# Patient Record
Sex: Female | Born: 1980 | Race: White | Hispanic: No | Marital: Married | Smoking: Former smoker
Health system: Southern US, Community
[De-identification: ages and names within clinical notes are randomized; demographics above are authoritative.]

## PROBLEM LIST (undated history)

## (undated) DIAGNOSIS — F419 Anxiety disorder, unspecified: Secondary | ICD-10-CM

## (undated) DIAGNOSIS — F1220 Cannabis dependence, uncomplicated: Secondary | ICD-10-CM

## (undated) DIAGNOSIS — M1990 Unspecified osteoarthritis, unspecified site: Secondary | ICD-10-CM

## (undated) DIAGNOSIS — K5900 Constipation, unspecified: Secondary | ICD-10-CM

## (undated) DIAGNOSIS — F449 Dissociative and conversion disorder, unspecified: Secondary | ICD-10-CM

## (undated) DIAGNOSIS — Z973 Presence of spectacles and contact lenses: Secondary | ICD-10-CM

## (undated) DIAGNOSIS — F32A Depression, unspecified: Secondary | ICD-10-CM

## (undated) DIAGNOSIS — E119 Type 2 diabetes mellitus without complications: Secondary | ICD-10-CM

## (undated) DIAGNOSIS — Z5189 Encounter for other specified aftercare: Secondary | ICD-10-CM

## (undated) DIAGNOSIS — G8929 Other chronic pain: Secondary | ICD-10-CM

## (undated) DIAGNOSIS — R519 Headache, unspecified: Secondary | ICD-10-CM

## (undated) DIAGNOSIS — R0602 Shortness of breath: Secondary | ICD-10-CM

## (undated) HISTORY — DX: Depression, unspecified: F32.A

## (undated) HISTORY — PX: TUBAL LIGATION: SHX77

## (undated) HISTORY — DX: Encounter for other specified aftercare: Z51.89

## (undated) HISTORY — DX: Anxiety disorder, unspecified: F41.9

## (undated) HISTORY — DX: Cannabis dependence, uncomplicated: F12.20

## (undated) HISTORY — PX: ABDOMINAL HYSTERECTOMY: SHX81

## (undated) HISTORY — DX: Constipation, unspecified: K59.00

## (undated) HISTORY — PX: ARTHROSCOPIC REPAIR ACL: SUR80

## (undated) HISTORY — DX: Dissociative and conversion disorder, unspecified: F44.9

## (undated) HISTORY — PX: KNEE SURGERY: SHX244

---

## 2004-11-29 HISTORY — PX: TUBAL LIGATION: SHX77

## 2016-06-21 ENCOUNTER — Emergency Department (HOSPITAL_COMMUNITY)
Admission: EM | Admit: 2016-06-21 | Discharge: 2016-06-22 | Disposition: A | Discharge status: Home / Self Care | Payer: Self-pay | Attending: Emergency Medicine | Admitting: Emergency Medicine

## 2016-06-21 DIAGNOSIS — F17210 Nicotine dependence, cigarettes, uncomplicated: Secondary | ICD-10-CM | POA: Insufficient documentation

## 2016-06-21 DIAGNOSIS — K047 Periapical abscess without sinus: Secondary | ICD-10-CM | POA: Insufficient documentation

## 2016-06-21 DIAGNOSIS — K029 Dental caries, unspecified: Secondary | ICD-10-CM | POA: Insufficient documentation

## 2016-06-21 NOTE — ED Triage Notes (Signed)
Pt reports she has a broken tooth on upper left jaw, swelling started today after eating at approx 1100. Denies trouble breathing.

## 2016-06-22 MED ORDER — PENICILLIN V POTASSIUM 500 MG PO TABS: 500.0000 mg | ORAL_TABLET | Freq: Four times a day (QID) | ORAL | 0 refills | Status: DC

## 2016-06-22 MED ORDER — KETOROLAC TROMETHAMINE 30 MG/ML IJ SOLN
30.0000 mg | Freq: Once | INTRAMUSCULAR | Status: AC
  Administered 2016-06-22: 30 mg via INTRAVENOUS
  Filled 2016-06-22: qty 1

## 2016-06-22 MED ORDER — NAPROXEN 500 MG PO TABS: ORAL_TABLET | ORAL | 0 refills | Status: DC

## 2016-06-22 MED ORDER — CLINDAMYCIN PHOSPHATE 900 MG/50ML IV SOLN
900.0000 mg | Freq: Once | INTRAVENOUS | Status: AC
  Administered 2016-06-22: 900 mg via INTRAVENOUS
  Filled 2016-06-22: qty 50

## 2016-06-22 MED ORDER — FENTANYL CITRATE (PF) 100 MCG/2ML IJ SOLN
25.0000 ug | Freq: Once | INTRAMUSCULAR | Status: AC
  Administered 2016-06-22: 25 ug via INTRAVENOUS
  Filled 2016-06-22: qty 2

## 2016-06-22 MED ORDER — SODIUM CHLORIDE 0.9 % IV BOLUS (SEPSIS)
1000.0000 mL | Freq: Once | INTRAVENOUS | Status: AC
  Administered 2016-06-22: 1000 mL via INTRAVENOUS

## 2016-06-22 NOTE — ED Provider Notes (Signed)
[facility] DEPT Provider Note   CSN: [id number] Arrival date & time: 06/21/16  2304  First Provider Contact:  12:05 AM  By signing my name below, I, Dyke Brackett, attest that this documentation has been prepared under the direction and in the presence of Rolland Porter, MD . Electronically Signed: Dyke Brackett, Scribe. 06/22/2016. 12:59 AM.  History   Chief Complaint Chief Complaint  Patient presents with  . Dental Pain    HPI Allison Galloway is a 35 y.o. female who presents to the Emergency Department complaining of moderate upper left dental pain onset 13 hours ago. Per pt, she has had intermittent dental pain for The past four months, but states it is much worse today. She states the tooth has been broken for at least a year. She states pain is worsened by eating. Pt also notes associated swelling that started this evening. Pt states she had eaten a chicken sandwich from [facility] at 11 this AM and has experienced symptoms after eating lunch. Pt was seen by a dentist who drilled another tooth and has not returned because she was "terrified". Pt denies any fever, trouble breathing, swallowing or drooling. Pt is a smoker and smokes .5 pack a day. She works as a Building control surveyor.   PCP none   The history is provided by the patient.  No language interpreter was used.   Past Medical History:  Diagnosis Date  . Cholestasis     Patient Active Problem List   Diagnosis Date Noted  . Cesarean delivery delivered 03/20/2016    Past Surgical History:  Procedure Laterality Date  . BREAST SURGERY     augmentation   . CESAREAN SECTION N/A 03/20/2016   Procedure: CESAREAN SECTION/ Tubal Ligation;  Surgeon: Louretta Shorten, MD;  Location: [facility];  Service: Obstetrics;  Laterality: N/A;  . GASTRIC BYPASS      OB History    Gravida Para Term Preterm AB Living   5 4 3 1 1 1    SAB TAB Ectopic Multiple Live Births   1     0         Home Medications    Prior to Admission medications   Medication Sig  Start Date End Date Taking? Authorizing Provider  acetaminophen (TYLENOL) 325 MG tablet Take 650 mg by mouth every 6 (six) hours as needed for mild pain.    Historical Provider, MD  Cyanocobalamin (VITAMIN B 12 PO) Take 1 tablet by mouth 2 (two) times daily.     Historical Provider, MD  folic acid (FOLVITE) A999333 MCG tablet Take 400 mcg by mouth daily.    Historical Provider, MD  ibuprofen (ADVIL,MOTRIN) 600 MG tablet Take 1 tablet (600 mg total) by mouth every 6 (six) hours. 03/24/16   Juanda Chance, NP  IRON PO Take 1 tablet by mouth daily.    Historical Provider, MD  naproxen (NAPROSYN) 500 MG tablet Take 1 po BID with food prn pain 06/22/16   Rolland Porter, MD  oxyCODONE-acetaminophen (PERCOCET/ROXICET) 5-325 MG tablet Take 1-2 tablets by mouth every 4 (four) hours as needed for moderate pain. 03/24/16   Juanda Chance, NP  penicillin v potassium (VEETID) 500 MG tablet Take 1 tablet (500 mg total) by mouth 4 (four) times daily. 06/22/16   Rolland Porter, MD  Prenatal Vit-Fe Fumarate-FA (PRENATAL MULTIVITAMIN) TABS tablet Take 1 tablet by mouth daily at 12 noon.    Historical Provider, MD  vitamin C (ASCORBIC ACID) 500 MG tablet Take 1,000 mg by mouth daily.  Historical Provider, MD    Family History Family History  Problem Relation Age of Onset  . Hypertension Other     Social History Social History  Substance Use Topics  . Smoking status: Current Every Day Smoker    Packs/day: 0.50    Types: Cigarettes  . Smokeless tobacco: Current User  . Alcohol use No   Takes care of her aunt  Allergies   Review of patient's allergies indicates no active allergies.   Review of Systems Review of Systems  HENT: Positive for dental problem and facial swelling. Negative for drooling and trouble swallowing.   All other systems reviewed and are negative.   Physical Exam Updated Vital Signs BP 139/90 (BP Location: Left Arm)   Pulse 93   Temp 98.3 F (36.8 C) (Oral)   Resp 16   Ht 5\' 4"  (1.626 m)   Wt  160 lb (72.6 kg)   LMP 05/31/2016   SpO2 98%   BMI 27.46 kg/m    Vital signs normal    Physical Exam  Constitutional: She is oriented to person, place, and time. She appears well-developed and well-nourished.  Non-toxic appearance. She does not appear ill. No distress.  HENT:  Head: Normocephalic.  Right Ear: External ear normal.  Left Ear: External ear normal.  Nose: Nose normal. No mucosal edema or rhinorrhea.  Mouth/Throat: Oropharynx is clear and moist and mucous membranes are normal. No dental abscesses or uvula swelling.    She Has swelling of the left side of her face. See photo.  Speech is normal. No drooling. No SOB.   Eyes: Conjunctivae and EOM are normal. Pupils are equal, round, and reactive to light.  Neck: Normal range of motion and full passive range of motion without pain. Neck supple.  Cardiovascular: Normal rate.   Pulmonary/Chest: Effort normal. No respiratory distress. She has no rhonchi. She exhibits no crepitus.  Abdominal: Normal appearance. She exhibits no distension.  Musculoskeletal: Normal range of motion.  Moves all extremities well.   Neurological: She is alert and oriented to person, place, and time. She has normal strength. No cranial nerve deficit.  Skin: Skin is warm, dry and intact. No rash noted. No erythema. No pallor.  Psychiatric: She has a normal mood and affect. Her speech is normal and behavior is normal. Her mood appears not anxious.  Nursing note and vitals reviewed.       ED Treatments / Results   Medications  sodium chloride 0.9 % bolus 1,000 mL (0 mLs Intravenous Stopped 06/22/16 0220)  clindamycin (CLEOCIN) IVPB 900 mg (0 mg Intravenous Stopped 06/22/16 0140)  ketorolac (TORADOL) 30 MG/ML injection 30 mg (30 mg Intravenous Given 06/22/16 0104)  fentaNYL (SUBLIMAZE) injection 25 mcg (25 mcg Intravenous Given 06/22/16 0105)      DIAGNOSTIC STUDIES:  Oxygen Saturation is 98% on RA, normal by my interpretation.     COORDINATION OF CARE:  12:12 AM Discussed treatment plan which includes Toradol and fentanyl for pain and she was given IV clindamycin for probable dental abscess with pt at bedside and pt agreed to plan.   Recheck at time of discharge patient states she's feeling better. She still has the swelling.      Procedures     Initial Impression / Assessment and Plan / ED Course  I have reviewed the triage vital signs and the nursing notes.  Pertinent labs & imaging results that were available during my care of the patient were reviewed by me and considered in my  medical decision making (see chart for details).    Final Clinical Impressions(s) / ED Diagnoses   Final diagnoses:  Abscessed tooth  Dental caries    New Prescriptions New Prescriptions   NAPROXEN (NAPROSYN) 500 MG TABLET    Take 1 po BID with food prn pain   PENICILLIN V POTASSIUM (VEETID) 500 MG TABLET    Take 1 tablet (500 mg total) by mouth 4 (four) times daily.    Plan discharge  Rolland Porter, MD, FACEP   I personally performed the services described in this documentation, which was scribed in my presence. The recorded information has been reviewed and considered.  Rolland Porter, MD, Barbette Or, MD 06/22/16 [phone]

## 2016-06-22 NOTE — Discharge Instructions (Signed)
Use ice packs on your face to reduce the swelling. Take the antibiotics until gone. You need to follow-up with a dentist for definitive care of your tooth. Return to the emergency department if you get high fevers, you're unable to swallow or have difficulty breathing, or if the swelling gets progressively worse instead of better.

## 2016-06-22 NOTE — ED Notes (Signed)
Pt alert & oriented x4, stable gait. Patient given discharge instructions, paperwork & prescription(s). Patient  instructed to stop at the registration desk to finish any additional paperwork. Patient verbalized understanding. Pt left department w/ no further questions. 

## 2017-04-06 ENCOUNTER — Emergency Department (HOSPITAL_COMMUNITY)
Admission: EM | Admit: 2017-04-06 | Discharge: 2017-04-06 | Discharge status: Absent without leave | Payer: Self-pay | Attending: Emergency Medicine | Admitting: Emergency Medicine

## 2017-04-06 ENCOUNTER — Emergency Department (HOSPITAL_COMMUNITY)
Admission: EM | Admit: 2017-04-06 | Discharge: 2017-04-06 | Disposition: A | Discharge status: Home / Self Care | Payer: Self-pay | Attending: Emergency Medicine | Admitting: Emergency Medicine

## 2017-04-06 ENCOUNTER — Encounter (HOSPITAL_COMMUNITY): Payer: Self-pay | Admitting: Emergency Medicine

## 2017-04-06 DIAGNOSIS — H65191 Other acute nonsuppurative otitis media, right ear: Secondary | ICD-10-CM | POA: Insufficient documentation

## 2017-04-06 DIAGNOSIS — H65199 Other acute nonsuppurative otitis media, unspecified ear: Secondary | ICD-10-CM

## 2017-04-06 DIAGNOSIS — F17210 Nicotine dependence, cigarettes, uncomplicated: Secondary | ICD-10-CM | POA: Insufficient documentation

## 2017-04-06 MED ORDER — HYDROCODONE-ACETAMINOPHEN 5-325 MG PO TABS: 2.0000 | ORAL_TABLET | ORAL | 0 refills | Status: DC | PRN

## 2017-04-06 MED ORDER — AMOXICILLIN 500 MG PO CAPS: 500.0000 mg | ORAL_CAPSULE | Freq: Three times a day (TID) | ORAL | 0 refills | Status: DC

## 2017-04-06 NOTE — ED Provider Notes (Signed)
[facility] DEPT Provider Note   CSN: [id number] Arrival date & time: 04/06/17  0818     History   Chief Complaint Chief Complaint  Patient presents with  . Otalgia    HPI Allison Galloway is a 36 y.o. female.  The history is provided by the patient. No language interpreter was used.  Otalgia  This is a new problem. The current episode started more than 2 days ago. The problem occurs constantly. The problem has been gradually worsening. There has been no fever. The pain is moderate. Associated symptoms include ear discharge.    History reviewed. No pertinent past medical history.  There are no active problems to display for this patient.   Past Surgical History:  Procedure Laterality Date  . ARTHROSCOPIC REPAIR ACL    . CESAREAN SECTION    . TUBAL LIGATION      OB History    No data available       Home Medications    Prior to Admission medications   Not on File    Family History History reviewed. No pertinent family history.  Social History Social History  Substance Use Topics  . Smoking status: Current Every Day Smoker    Packs/day: 0.50    Types: Cigarettes  . Smokeless tobacco: Never Used  . Alcohol use No     Allergies   Patient has no allergy information on record.   Review of Systems Review of Systems  HENT: Positive for ear discharge and ear pain.   All other systems reviewed and are negative.    Physical Exam Updated Vital Signs BP (!) 138/99 (BP Location: Right Arm)   Pulse 70   Temp 97.8 F (36.6 C) (Oral)   Resp 18   Ht 5\' 4"  (1.626 m)   Wt 72.6 kg   LMP 03/16/2017   SpO2 100%   BMI 27.46 kg/m   Physical Exam  Constitutional: She appears well-developed and well-nourished. No distress.  HENT:  Head: Normocephalic and atraumatic.  Erythema right tm. bulging  Eyes: Conjunctivae are normal.  Neck: Neck supple.  Cardiovascular: Normal rate and regular rhythm.   No murmur heard. Pulmonary/Chest: Effort normal and  breath sounds normal. No respiratory distress.  Abdominal: Soft. There is no tenderness.  Musculoskeletal: She exhibits no edema.  Neurological: She is alert.  Skin: Skin is warm and dry.  Psychiatric: She has a normal mood and affect.  Nursing note and vitals reviewed.    ED Treatments / Results  Labs (all labs ordered are listed, but only abnormal results are displayed) Labs Reviewed - No data to display  EKG  EKG Interpretation None       Radiology No results found.  Procedures Procedures (including critical care time)  Medications Ordered in ED Medications - No data to display   Initial Impression / Assessment and Plan / ED Course  I have reviewed the triage vital signs and the nursing notes.  Pertinent labs & imaging results that were available during my care of the patient were reviewed by me and considered in my medical decision making (see chart for details).      Final Clinical Impressions(s) / ED Diagnoses   Final diagnoses:  Other acute nonsuppurative otitis media, recurrence not specified, unspecified laterality    New Prescriptions New Prescriptions   AMOXICILLIN (AMOXIL) 500 MG CAPSULE    Take 1 capsule (500 mg total) by mouth 3 (three) times daily.   HYDROCODONE-ACETAMINOPHEN (NORCO/VICODIN) 5-325 MG TABLET    Take  2 tablets by mouth every 4 (four) hours as needed.     Fransico Meadow, PA-C 04/06/17 0835    Fransico Meadow, PA-C 04/06/17 0836    Fransico Meadow, PA-C 04/06/17 1518    Julianne Rice, MD 04/11/17 [phone]

## 2017-04-06 NOTE — ED Triage Notes (Signed)
Pt reports right ear pain for last several days. Pt denies any new or recent trauma. Pt reports intermittent cough with green sputum. nad noted.

## 2018-12-09 ENCOUNTER — Emergency Department (HOSPITAL_COMMUNITY): Payer: Self-pay

## 2018-12-09 ENCOUNTER — Encounter (HOSPITAL_COMMUNITY): Payer: Self-pay | Admitting: *Deleted

## 2018-12-09 ENCOUNTER — Emergency Department (HOSPITAL_COMMUNITY)
Admission: EM | Admit: 2018-12-09 | Discharge: 2018-12-09 | Disposition: A | Discharge status: Home / Self Care | Payer: Self-pay | Attending: Emergency Medicine | Admitting: Emergency Medicine

## 2018-12-09 DIAGNOSIS — Z87891 Personal history of nicotine dependence: Secondary | ICD-10-CM | POA: Insufficient documentation

## 2018-12-09 DIAGNOSIS — J9801 Acute bronchospasm: Secondary | ICD-10-CM | POA: Insufficient documentation

## 2018-12-09 DIAGNOSIS — J069 Acute upper respiratory infection, unspecified: Secondary | ICD-10-CM | POA: Insufficient documentation

## 2018-12-09 LAB — CBC WITH DIFFERENTIAL/PLATELET
ABS IMMATURE GRANULOCYTES: 0.18 10*3/uL — AB (ref 0.00–0.07)
Basophils Absolute: 0.1 10*3/uL (ref 0.0–0.1)
Basophils Relative: 0 %
Eosinophils Absolute: 0.1 10*3/uL (ref 0.0–0.5)
Eosinophils Relative: 0 %
HCT: 39.8 % (ref 36.0–46.0)
HEMOGLOBIN: 13.1 g/dL (ref 12.0–15.0)
IMMATURE GRANULOCYTES: 1 %
LYMPHS PCT: 18 %
Lymphs Abs: 3.1 10*3/uL (ref 0.7–4.0)
MCH: 27.5 pg (ref 26.0–34.0)
MCHC: 32.9 g/dL (ref 30.0–36.0)
MCV: 83.6 fL (ref 80.0–100.0)
Monocytes Absolute: 1 10*3/uL (ref 0.1–1.0)
Monocytes Relative: 6 %
NEUTROS PCT: 75 %
Neutro Abs: 12.4 10*3/uL — ABNORMAL HIGH (ref 1.7–7.7)
Platelets: 327 10*3/uL (ref 150–400)
RBC: 4.76 MIL/uL (ref 3.87–5.11)
RDW: 14 % (ref 11.5–15.5)
WBC: 16.8 10*3/uL — AB (ref 4.0–10.5)
nRBC: 0 % (ref 0.0–0.2)

## 2018-12-09 LAB — BASIC METABOLIC PANEL
Anion gap: 10 (ref 5–15)
BUN: 15 mg/dL (ref 6–20)
CO2: 21 mmol/L — ABNORMAL LOW (ref 22–32)
Calcium: 9.1 mg/dL (ref 8.9–10.3)
Chloride: 106 mmol/L (ref 98–111)
Creatinine, Ser: 0.79 mg/dL (ref 0.44–1.00)
GFR calc Af Amer: >60 mL/min (ref >60)
GFR calc non Af Amer: >60 mL/min (ref >60)
Glucose, Bld: 116 mg/dL — ABNORMAL HIGH (ref 70–99)
POTASSIUM: 3.7 mmol/L (ref 3.5–5.1)
SODIUM: 137 mmol/L (ref 135–145)

## 2018-12-09 LAB — I-STAT BETA HCG BLOOD, ED (MC, WL, AP ONLY): I-stat hCG, quantitative: <5 m[IU]/mL (ref <5)

## 2018-12-09 LAB — D-DIMER, QUANTITATIVE: D-Dimer, Quant: 0.47 ug/mL-FEU (ref 0.00–0.50)

## 2018-12-09 LAB — INFLUENZA PANEL BY PCR (TYPE A & B)
Influenza A By PCR: NEGATIVE
Influenza B By PCR: NEGATIVE

## 2018-12-09 LAB — TROPONIN I: Troponin I: <0.03 ng/mL (ref <0.03)

## 2018-12-09 MED ORDER — IPRATROPIUM BROMIDE 0.02 % IN SOLN
1.0000 mg | Freq: Once | RESPIRATORY_TRACT | Status: AC
  Administered 2018-12-09: 1 mg via RESPIRATORY_TRACT
  Filled 2018-12-09: qty 5

## 2018-12-09 MED ORDER — IPRATROPIUM-ALBUTEROL 0.5-2.5 (3) MG/3ML IN SOLN
RESPIRATORY_TRACT | Status: AC
  Filled 2018-12-09: qty 3

## 2018-12-09 MED ORDER — METHYLPREDNISOLONE SODIUM SUCC 125 MG IJ SOLR
125.0000 mg | Freq: Once | INTRAMUSCULAR | Status: AC
  Administered 2018-12-09: 125 mg via INTRAVENOUS
  Filled 2018-12-09: qty 2

## 2018-12-09 MED ORDER — ALBUTEROL SULFATE (2.5 MG/3ML) 0.083% IN NEBU: 5.0000 mg | INHALATION_SOLUTION | Freq: Once | RESPIRATORY_TRACT | Status: DC

## 2018-12-09 MED ORDER — ALBUTEROL (5 MG/ML) CONTINUOUS INHALATION SOLN
10.0000 mg/h | INHALATION_SOLUTION | Freq: Once | RESPIRATORY_TRACT | Status: AC
  Administered 2018-12-09: 10 mg/h via RESPIRATORY_TRACT
  Filled 2018-12-09: qty 20

## 2018-12-09 MED ORDER — ALBUTEROL SULFATE (2.5 MG/3ML) 0.083% IN NEBU: 2.5000 mg | INHALATION_SOLUTION | RESPIRATORY_TRACT | 0 refills | Status: DC | PRN

## 2018-12-09 MED ORDER — ALBUTEROL SULFATE HFA 108 (90 BASE) MCG/ACT IN AERS
4.0000 | INHALATION_SPRAY | RESPIRATORY_TRACT | Status: AC
  Administered 2018-12-09: 4 via RESPIRATORY_TRACT
  Filled 2018-12-09: qty 6.7

## 2018-12-09 MED ORDER — PREDNISONE 20 MG PO TABS: 40.0000 mg | ORAL_TABLET | Freq: Every day | ORAL | 0 refills | Status: DC

## 2018-12-09 NOTE — ED Notes (Signed)
Pt ambulated around nurse's station. Pt pulse ox 96-97% while ambulating.

## 2018-12-09 NOTE — ED Triage Notes (Signed)
Pt with sob off and on for last couple of days. Dx with flu and bronchitis at [facility]  On Tuesday. Started on steroids.  Pt sob that talking is difficult and audible wheezing noted.

## 2018-12-09 NOTE — ED Provider Notes (Signed)
[facility] EMERGENCY DEPARTMENT Provider Note   CSN: [id number] Arrival date & time: 12/09/18  1842     History   Chief Complaint Chief Complaint  Patient presents with  . Shortness of Breath    HPI Allison Galloway is a 38 y.o. female.  HPI  Pt was seen at 1905.  Per pt, c/o gradual onset and worsening of persistent cough, wheezing and SOB for the past 4 days.  Has been associated with generalized chest "pain," described as "soreness." States she was evaluated at [facility] ED for her symptoms 4 days ago, dx flu and bronchitis. States she was started on "steroids." Denies palpitations, no back pain, no abd pain, no N/V/D, no fevers, no rash.    History reviewed. No pertinent past medical history.  There are no active problems to display for this patient.   Past Surgical History:  Procedure Laterality Date  . ARTHROSCOPIC REPAIR ACL    . CESAREAN SECTION    . KNEE SURGERY    . TUBAL LIGATION       OB History   No obstetric history on file.      Home Medications    Prior to Admission medications   Medication Sig Start Date End Date Taking? Authorizing Provider  DM-Doxylamine-Acetaminophen (NYQUIL COLD & FLU PO) Take 1 tablet by mouth daily as needed (cold).   Yes [provider]  amoxicillin (AMOXIL) 500 MG capsule Take 1 capsule (500 mg total) by mouth 3 (three) times daily. 04/06/17   Fransico Meadow, PA-C  HYDROcodone-acetaminophen (NORCO/VICODIN) 5-325 MG tablet Take 2 tablets by mouth every 4 (four) hours as needed. 04/06/17   Fransico Meadow, PA-C    Family History History reviewed. No pertinent family history.  Social History Social History   Tobacco Use  . Smoking status: Former Smoker    Packs/day: 0.50    Types: Cigarettes  . Smokeless tobacco: Never Used  Substance Use Topics  . Alcohol use: No  . Drug use: No     Allergies   Patient has no known allergies.   Review of Systems Review of Systems ROS: Statement: All systems negative  except as marked or noted in the HPI; Constitutional: Negative for fever and chills. ; ; Eyes: Negative for eye pain, redness and discharge. ; ; ENMT: Negative for ear pain, hoarseness, nasal congestion, sinus pressure and sore throat. ; ; Cardiovascular: Negative for palpitations, diaphoresis, and peripheral edema. ; ; Respiratory: +cough, wheezing, SOB, CP. Negative for stridor. ; ; Gastrointestinal: Negative for nausea, vomiting, diarrhea, abdominal pain, blood in stool, hematemesis, jaundice and rectal bleeding. . ; ; Genitourinary: Negative for dysuria, flank pain and hematuria. ; ; Musculoskeletal: Negative for back pain and neck pain. Negative for swelling and trauma.; ; Skin: Negative for pruritus, rash, abrasions, blisters, bruising and skin lesion.; ; Neuro: Negative for headache, lightheadedness and neck stiffness. Negative for weakness, altered level of consciousness, altered mental status, extremity weakness, paresthesias, involuntary movement, seizure and syncope.       Physical Exam Updated Vital Signs BP 124/80   Pulse 77   Resp 17   Ht 5\' 3"  (1.6 m)   Wt 77.1 kg   LMP 12/01/2018   SpO2 98%   BMI 30.11 kg/m    BP 118/77   Pulse 84   Resp 15   Ht 5\' 3"  (1.6 m)   Wt 77.1 kg   LMP 12/01/2018   SpO2 96%   BMI 30.11 kg/m    Physical Exam  1910: Physical examination:  Nursing notes reviewed; Vital signs and O2 SAT reviewed;  Constitutional: Well developed, Well nourished, Well hydrated, Uncomfortable appearing.;; Head:  Normocephalic, atraumatic; Eyes: EOMI, PERRL, No scleral icterus; ENMT: Mouth and pharynx normal, Mucous membranes moist; Neck: Supple, Full range of motion, No lymphadenopathy; Cardiovascular: Tachycardic rate and rhythm, No gallop; Respiratory: Breath sounds coarse & equal bilaterally, insp/exp wheezes bilat. Faint audible wheezing.  Speaking short sentences, sitting upright, tachypneic.; Chest: Nontender, Movement normal; Abdomen: Soft, Nontender,  Nondistended, Normal bowel sounds; Genitourinary: No CVA tenderness; Extremities: Pulses normal, No tenderness, No edema, No calf edema or asymmetry.; Neuro: AA&Ox3, Major CN grossly intact.  Speech clear. No gross focal motor or sensory deficits in extremities.; Skin: Color normal, Warm, Dry.    ED Treatments / Results  Labs (all labs ordered are listed, but only abnormal results are displayed)   EKG EKG Interpretation  Date/Time:  Saturday December 09 2018 18:58:10 EST Ventricular Rate:  79 PR Interval:    QRS Duration: 94 QT Interval:  399 QTC Calculation: 458 R Axis:   45 Text Interpretation:  Sinus rhythm Inferior infarct, old Anterolateral Q wave, probably normal for age Baseline wander No old tracing to compare Confirmed by Francine Graven [phone]) on 12/09/2018 7:11:29 PM   Radiology   Procedures Procedures (including critical care time)  Medications Ordered in ED Medications  ipratropium-albuterol (DUONEB) 0.5-2.5 (3) MG/3ML nebulizer solution (  Not Given 12/09/18 1918)  albuterol (PROVENTIL,VENTOLIN) solution continuous neb (10 mg/hr Nebulization Given 12/09/18 1918)  ipratropium (ATROVENT) nebulizer solution 1 mg (1 mg Nebulization Given 12/09/18 1918)  methylPREDNISolone sodium succinate (SOLU-MEDROL) 125 mg/2 mL injection 125 mg (125 mg Intravenous Given 12/09/18 1933)     Initial Impression / Assessment and Plan / ED Course  I have reviewed the triage vital signs and the nursing notes.  Pertinent labs & imaging results that were available during my care of the patient were reviewed by me and considered in my medical decision making (see chart for details).  MDM Reviewed: previous chart, nursing note and vitals Reviewed previous: labs and ECG Interpretation: labs, ECG and x-ray Total time providing critical care: 30-74 minutes. This excludes time spent performing separately reportable procedures and services.  CRITICAL CARE Performed by: Francine Graven Total critical care time: 35 minutes Critical care time was exclusive of separately billable procedures and treating other patients. Critical care was necessary to treat or prevent imminent or life-threatening deterioration. Critical care was time spent personally by me on the following activities: development of treatment plan with patient and/or surrogate as well as nursing, discussions with consultants, evaluation of patient's response to treatment, examination of patient, obtaining history from patient or surrogate, ordering and performing treatments and interventions, ordering and review of laboratory studies, ordering and review of radiographic studies, pulse oximetry and re-evaluation of patient's condition.   Results for orders placed or performed during the hospital encounter of 12/09/18  D-dimer, quantitative  Result Value Ref Range   D-Dimer, Quant 0.47 0.00 - 0.50 ug/mL-FEU  CBC with Differential  Result Value Ref Range   WBC 16.8 (H) 4.0 - 10.5 K/uL   RBC 4.76 3.87 - 5.11 MIL/uL   Hemoglobin 13.1 12.0 - 15.0 g/dL   HCT 39.8 36.0 - 46.0 %   MCV 83.6 80.0 - 100.0 fL   MCH 27.5 26.0 - 34.0 pg   MCHC 32.9 30.0 - 36.0 g/dL   RDW 14.0 11.5 - 15.5 %   Platelets 327 150 - 400 K/uL  nRBC 0.0 0.0 - 0.2 %   Neutrophils Relative % 75 %   Neutro Abs 12.4 (H) 1.7 - 7.7 K/uL   Lymphocytes Relative 18 %   Lymphs Abs 3.1 0.7 - 4.0 K/uL   Monocytes Relative 6 %   Monocytes Absolute 1.0 0.1 - 1.0 K/uL   Eosinophils Relative 0 %   Eosinophils Absolute 0.1 0.0 - 0.5 K/uL   Basophils Relative 0 %   Basophils Absolute 0.1 0.0 - 0.1 K/uL   Immature Granulocytes 1 %   Abs Immature Granulocytes 0.18 (H) 0.00 - 0.07 K/uL  Troponin I - Once  Result Value Ref Range   Troponin I <0.03 <0.03 ng/mL  Basic metabolic panel  Result Value Ref Range   Sodium 137 135 - 145 mmol/L   Potassium 3.7 3.5 - 5.1 mmol/L   Chloride 106 98 - 111 mmol/L   CO2 21 (L) 22 - 32 mmol/L   Glucose, Bld 116  (H) 70 - 99 mg/dL   BUN 15 6 - 20 mg/dL   Creatinine, Ser 0.79 0.44 - 1.00 mg/dL   Calcium 9.1 8.9 - 10.3 mg/dL   GFR calc non Af Amer >60 >60 mL/min   GFR calc Af Amer >60 >60 mL/min   Anion gap 10 5 - 15  I-Stat beta hCG blood, ED  Result Value Ref Range   I-stat hCG, quantitative <5.0 <5 mIU/mL   Comment 3           Dg Chest Port 1 View Result Date: 12/09/2018 CLINICAL DATA:  Intermittent shortness of breath over the past 2 days. Patient diagnosed with influenza and bronchitis at [facility] 4 days ago at which time she was started on corticosteroid therapy. Audible wheezing currently. EXAM: PORTABLE CHEST 1 VIEW COMPARISON:  None. FINDINGS: Markedly suboptimal inspiration accounts for atelectasis at the lung bases and accentuates the cardiac silhouette. Taking this into account, cardiac silhouette likely upper normal to mildly enlarged. Lungs otherwise clear without evidence of confluent airspace consolidation. IMPRESSION: Markedly suboptimal inspiration accounts for bibasilar atelectasis. No acute cardiopulmonary disease otherwise. Electronically Signed   By: Evangeline Dakin M.D.   On: 12/09/2018 19:33    2220:  Pt states she "feels better" after neb and steroid.  NAD, lungs CTA bilat, no wheezing, resps easy, speaking full sentences, Sats 96-97% R/A.  Pt ambulated around the ED with Sats remaining 96-97 % R/A, resps easy, NAD.  Pt states she feels better and wants to go home now. Pt states she has a neb machine at home; will rx solution. States she only has one or 2 days left of rx medrol dose pack; will rx prednisone burst. 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 Dx and testing d/w pt and family.  Questions answered.  Verb understanding, agreeable to d/c home with outpt f/u.     Final Clinical Impressions(s) / ED Diagnoses   Final diagnoses:  None    ED Discharge Orders    None       Francine Graven, DO 12/14/18 1109

## 2018-12-09 NOTE — Discharge Instructions (Addendum)
Stop taking the steroid dose pack and take the new prescriptions as directed.  Use your albuterol inhaler (2 to 4 puffs) or your albuterol nebulizer (1 unit dose) every 4 hours for the next 7 days, then as needed for cough, wheezing, or shortness of breath.  Call your regular medical doctor Monday morning to schedule a follow up appointment within the next 3 days.  Return to the Emergency Department immediately sooner if worsening.

## 2019-06-27 ENCOUNTER — Other Ambulatory Visit: Payer: Self-pay

## 2019-06-27 ENCOUNTER — Emergency Department (HOSPITAL_COMMUNITY): Payer: Self-pay

## 2019-06-27 ENCOUNTER — Emergency Department (HOSPITAL_COMMUNITY)
Admission: EM | Admit: 2019-06-27 | Discharge: 2019-06-27 | Disposition: A | Discharge status: Home / Self Care | Payer: Self-pay | Attending: Emergency Medicine | Admitting: Emergency Medicine

## 2019-06-27 ENCOUNTER — Encounter (HOSPITAL_COMMUNITY): Payer: Self-pay | Admitting: *Deleted

## 2019-06-27 DIAGNOSIS — R05 Cough: Secondary | ICD-10-CM | POA: Insufficient documentation

## 2019-06-27 DIAGNOSIS — Z20828 Contact with and (suspected) exposure to other viral communicable diseases: Secondary | ICD-10-CM | POA: Insufficient documentation

## 2019-06-27 DIAGNOSIS — F17210 Nicotine dependence, cigarettes, uncomplicated: Secondary | ICD-10-CM | POA: Insufficient documentation

## 2019-06-27 DIAGNOSIS — R0602 Shortness of breath: Secondary | ICD-10-CM | POA: Insufficient documentation

## 2019-06-27 DIAGNOSIS — R0789 Other chest pain: Secondary | ICD-10-CM | POA: Insufficient documentation

## 2019-06-27 LAB — BASIC METABOLIC PANEL
Anion gap: 6 (ref 5–15)
BUN: 13 mg/dL (ref 6–20)
CO2: 23 mmol/L (ref 22–32)
Calcium: 9.4 mg/dL (ref 8.9–10.3)
Chloride: 110 mmol/L (ref 98–111)
Creatinine, Ser: 0.85 mg/dL (ref 0.44–1.00)
GFR calc Af Amer: >60 mL/min (ref >60)
GFR calc non Af Amer: >60 mL/min (ref >60)
Glucose, Bld: 88 mg/dL (ref 70–99)
Potassium: 3.6 mmol/L (ref 3.5–5.1)
Sodium: 139 mmol/L (ref 135–145)

## 2019-06-27 LAB — CBC WITH DIFFERENTIAL/PLATELET
Abs Immature Granulocytes: 0.01 10*3/uL (ref 0.00–0.07)
Basophils Absolute: 0 10*3/uL (ref 0.0–0.1)
Basophils Relative: 0 %
Eosinophils Absolute: 0.3 10*3/uL (ref 0.0–0.5)
Eosinophils Relative: 3 %
HCT: 40.4 % (ref 36.0–46.0)
Hemoglobin: 13.4 g/dL (ref 12.0–15.0)
Immature Granulocytes: 0 %
Lymphocytes Relative: 39 %
Lymphs Abs: 3.5 10*3/uL (ref 0.7–4.0)
MCH: 27.6 pg (ref 26.0–34.0)
MCHC: 33.2 g/dL (ref 30.0–36.0)
MCV: 83.3 fL (ref 80.0–100.0)
Monocytes Absolute: 0.5 10*3/uL (ref 0.1–1.0)
Monocytes Relative: 6 %
Neutro Abs: 4.7 10*3/uL (ref 1.7–7.7)
Neutrophils Relative %: 52 %
Platelets: 271 10*3/uL (ref 150–400)
RBC: 4.85 MIL/uL (ref 3.87–5.11)
RDW: 13 % (ref 11.5–15.5)
WBC: 9 10*3/uL (ref 4.0–10.5)
nRBC: 0 % (ref 0.0–0.2)

## 2019-06-27 LAB — D-DIMER, QUANTITATIVE: D-Dimer, Quant: 0.34 ug/mL-FEU (ref 0.00–0.50)

## 2019-06-27 LAB — TROPONIN I (HIGH SENSITIVITY): Troponin I (High Sensitivity): <2 ng/L (ref <18)

## 2019-06-27 LAB — SARS CORONAVIRUS 2 BY RT PCR (HOSPITAL ORDER, PERFORMED IN ~~LOC~~ HOSPITAL LAB): SARS Coronavirus 2: NEGATIVE

## 2019-06-27 LAB — POC URINE PREG, ED: Preg Test, Ur: NEGATIVE

## 2019-06-27 MED ORDER — PREDNISONE 20 MG PO TABS: 40.0000 mg | ORAL_TABLET | Freq: Every day | ORAL | 0 refills | Status: DC

## 2019-06-27 MED ORDER — ALBUTEROL SULFATE HFA 108 (90 BASE) MCG/ACT IN AERS
2.0000 | INHALATION_SPRAY | Freq: Once | RESPIRATORY_TRACT | Status: AC
  Administered 2019-06-27: 2 via RESPIRATORY_TRACT
  Filled 2019-06-27: qty 6.7

## 2019-06-27 NOTE — ED Provider Notes (Signed)
[facility] EMERGENCY DEPARTMENT Provider Note   CSN: [id number] Arrival date & time: 06/27/19  1950     History   Chief Complaint Chief Complaint  Patient presents with  . Shortness of Breath    HPI Allison Galloway is a 38 y.o. female.     HPI   Allison Galloway is a 38 y.o. female who presents to the Emergency Department complaining of shortness of breath today.  States that she developed shortness of breath this morning that has gradually been increasing in severity.  She also reports a cough that has been non-productive and associated with chest tightness throughout her chest.  She reports a history of similar symptoms in January of this year and was treated for bronchitis.  She admits to smoking cigarettes.  She denies fever, chill, loss of taste or smell and any known COVID exposures.    History reviewed. No pertinent past medical history.  There are no active problems to display for this patient.   Past Surgical History:  Procedure Laterality Date  . ARTHROSCOPIC REPAIR ACL    . CESAREAN SECTION    . KNEE SURGERY    . TUBAL LIGATION       OB History   No obstetric history on file.      Home Medications    Prior to Admission medications   Not on File    Family History History reviewed. No pertinent family history.  Social History Social History   Tobacco Use  . Smoking status: Current Every Day Smoker    Packs/day: 0.50    Types: Cigarettes  . Smokeless tobacco: Never Used  Substance Use Topics  . Alcohol use: No  . Drug use: No     Allergies   Patient has no known allergies.   Review of Systems Review of Systems  Constitutional: Negative for appetite change, chills and fever.  HENT: Negative for congestion, sore throat and trouble swallowing.   Respiratory: Positive for cough, chest tightness and shortness of breath. Negative for wheezing.   Cardiovascular: Negative for chest pain and leg swelling.  Genitourinary: Negative for dysuria.   Musculoskeletal: Negative for arthralgias and neck pain.  Skin: Negative for rash.  Neurological: Negative for dizziness, weakness, numbness and headaches.     Physical Exam Updated Vital Signs BP (!) 145/98 (BP Location: Right Arm)   Pulse 68   Temp 97.7 F (36.5 C) (Oral)   Resp 16   Ht 5\' 3"  (1.6 m)   Wt 74.8 kg   LMP 06/10/2019   SpO2 100%   BMI 29.23 kg/m   Physical Exam Vitals signs and nursing note reviewed.  Constitutional:      Appearance: She is well-developed.  HENT:     Mouth/Throat:     Mouth: Mucous membranes are moist.  Neck:     Musculoskeletal: No muscular tenderness.  Cardiovascular:     Rate and Rhythm: Normal rate and regular rhythm.     Pulses: Normal pulses.  Pulmonary:     Effort: Pulmonary effort is normal.     Breath sounds: Normal breath sounds. No wheezing.  Chest:     Chest wall: No tenderness.  Musculoskeletal: Normal range of motion.     Right lower leg: No edema.     Left lower leg: No edema.  Lymphadenopathy:     Cervical: No cervical adenopathy.  Skin:    General: Skin is warm.     Capillary Refill: Capillary refill takes less than 2 seconds.  Findings: No erythema or rash.  Neurological:     General: No focal deficit present.     Mental Status: She is alert.     Sensory: No sensory deficit.     Motor: No weakness.      ED Treatments / Results  Labs (all labs ordered are listed, but only abnormal results are displayed) Labs Reviewed  SARS CORONAVIRUS 2 ([place] [facility])  CBC WITH DIFFERENTIAL/PLATELET  BASIC METABOLIC PANEL  D-DIMER, QUANTITATIVE (NOT AT [facility])  POC URINE PREG, ED  POC URINE PREG, ED  TROPONIN I (HIGH SENSITIVITY)  TROPONIN I (HIGH SENSITIVITY)    EKG EKG Interpretation  Date/Time:  Wednesday June 27 2019 20:04:31 EDT Ventricular Rate:  70 PR Interval:    QRS Duration: 107 QT Interval:  425 QTC Calculation: 459 R Axis:   53 Text Interpretation:   Sinus rhythm Confirmed by Virgel Manifold [id number]) on 06/27/2019 9:02:33 PM   Radiology Dg Chest Portable 1 View  Result Date: 06/27/2019 CLINICAL DATA:  Weakness and shortness of breath. EXAM: PORTABLE CHEST 1 VIEW COMPARISON:  11/29/2018 FINDINGS: Examination is degraded due to patient body habitus. Grossly unchanged cardiac silhouette and mediastinal contours given slightly reduced lung volumes, AP projection and portable technique. There is persistent thickening of the right paratracheal site, presumably secondary to prominent vasculature. Minimal perihilar opacities favored to represent atelectasis. No discrete focal airspace opacities. No pleural effusion or pneumothorax. No evidence of edema. No acute osseous abnormalities. IMPRESSION: Minimal perihilar atelectasis without superimposed acute cardiopulmonary disease on this AP portable examination. Further evaluation with a PA and lateral chest radiograph may be obtained as clinically indicated. Electronically Signed   By: Sandi Mariscal M.D.   On: 06/27/2019 20:30    Procedures Procedures (including critical care time)  Medications Ordered in ED Medications  albuterol (VENTOLIN HFA) 108 (90 Base) MCG/ACT inhaler 2 puff (has no administration in time range)     Initial Impression / Assessment and Plan / ED Course  I have reviewed the triage vital signs and the nursing notes.  Pertinent labs & imaging results that were available during my care of the patient were reviewed by me and considered in my medical decision making (see chart for details).        Pt with shortness of breath.  No chest pain, fever.  Work up is reassuring.  Neg d-dimer and COVID test negative.  Dyspnea improved after using  albuterol MDI.    Pt is well appearing, non-toxic.  Albuterol MDI dispensed.  Pt is well appearing.  Ambulated in the dept w/o difficulty and maintained O2 saturation of 92% RA   Final Clinical Impressions(s) / ED Diagnoses   Final diagnoses:   SOB (shortness of breath)    ED Discharge Orders    None       Kem Parkinson, PA-C 06/29/19 1311    Virgel Manifold, MD 07/05/19 [phone]

## 2019-06-27 NOTE — Discharge Instructions (Signed)
2 puffs of the inhaler 4 times a day for 2-3 days then 1 puff every 4 hrs as needed.  Take the prednisone as directed.  Return to ER for any worsening symptoms such as fever or increasing shortness of breath.

## 2019-06-27 NOTE — ED Notes (Signed)
Pt ambulated well. O2 was 92%

## 2019-06-27 NOTE — ED Triage Notes (Signed)
Pt with respiratory distress since today, hx of same back in December. Pt is a smoker.

## 2019-06-28 LAB — TROPONIN I (HIGH SENSITIVITY): Troponin I (High Sensitivity): <2 ng/L (ref <18)

## 2019-06-28 LAB — POC URINE PREG, ED: Preg Test, Ur: NEGATIVE

## 2019-07-29 ENCOUNTER — Emergency Department (HOSPITAL_COMMUNITY): Payer: Self-pay

## 2019-07-29 ENCOUNTER — Other Ambulatory Visit: Payer: Self-pay

## 2019-07-29 ENCOUNTER — Emergency Department (HOSPITAL_COMMUNITY)
Admission: EM | Admit: 2019-07-29 | Discharge: 2019-07-29 | Disposition: A | Discharge status: Home / Self Care | Payer: Self-pay | Attending: Emergency Medicine | Admitting: Emergency Medicine

## 2019-07-29 ENCOUNTER — Encounter (HOSPITAL_COMMUNITY): Payer: Self-pay | Admitting: Emergency Medicine

## 2019-07-29 DIAGNOSIS — J45901 Unspecified asthma with (acute) exacerbation: Secondary | ICD-10-CM | POA: Insufficient documentation

## 2019-07-29 DIAGNOSIS — F17210 Nicotine dependence, cigarettes, uncomplicated: Secondary | ICD-10-CM | POA: Insufficient documentation

## 2019-07-29 DIAGNOSIS — Z20828 Contact with and (suspected) exposure to other viral communicable diseases: Secondary | ICD-10-CM | POA: Insufficient documentation

## 2019-07-29 LAB — I-STAT CHEM 8, ED
BUN: 7 mg/dL (ref 6–20)
Calcium, Ion: 1.17 mmol/L (ref 1.15–1.40)
Chloride: 105 mmol/L (ref 98–111)
Creatinine, Ser: 0.8 mg/dL (ref 0.44–1.00)
Glucose, Bld: 94 mg/dL (ref 70–99)
HCT: 41 % (ref 36.0–46.0)
Hemoglobin: 13.9 g/dL (ref 12.0–15.0)
Potassium: 4 mmol/L (ref 3.5–5.1)
Sodium: 140 mmol/L (ref 135–145)
TCO2: 23 mmol/L (ref 22–32)

## 2019-07-29 LAB — I-STAT CREATININE, ED: Creatinine, Ser: 0.8 mg/dL (ref 0.44–1.00)

## 2019-07-29 LAB — SARS CORONAVIRUS 2 BY RT PCR (HOSPITAL ORDER, PERFORMED IN ~~LOC~~ HOSPITAL LAB): SARS Coronavirus 2: NEGATIVE

## 2019-07-29 LAB — I-STAT BETA HCG BLOOD, ED (MC, WL, AP ONLY): I-stat hCG, quantitative: <5 m[IU]/mL (ref <5)

## 2019-07-29 IMAGING — CR PORTABLE CHEST - 1 VIEW
1 series · 1 of 1 positions shown · non-contrast
Comparison: [DATE]

CLINICAL DATA: Wheezing and dyspnea this morning

EXAM:
PORTABLE CHEST 1 VIEW

[portable]
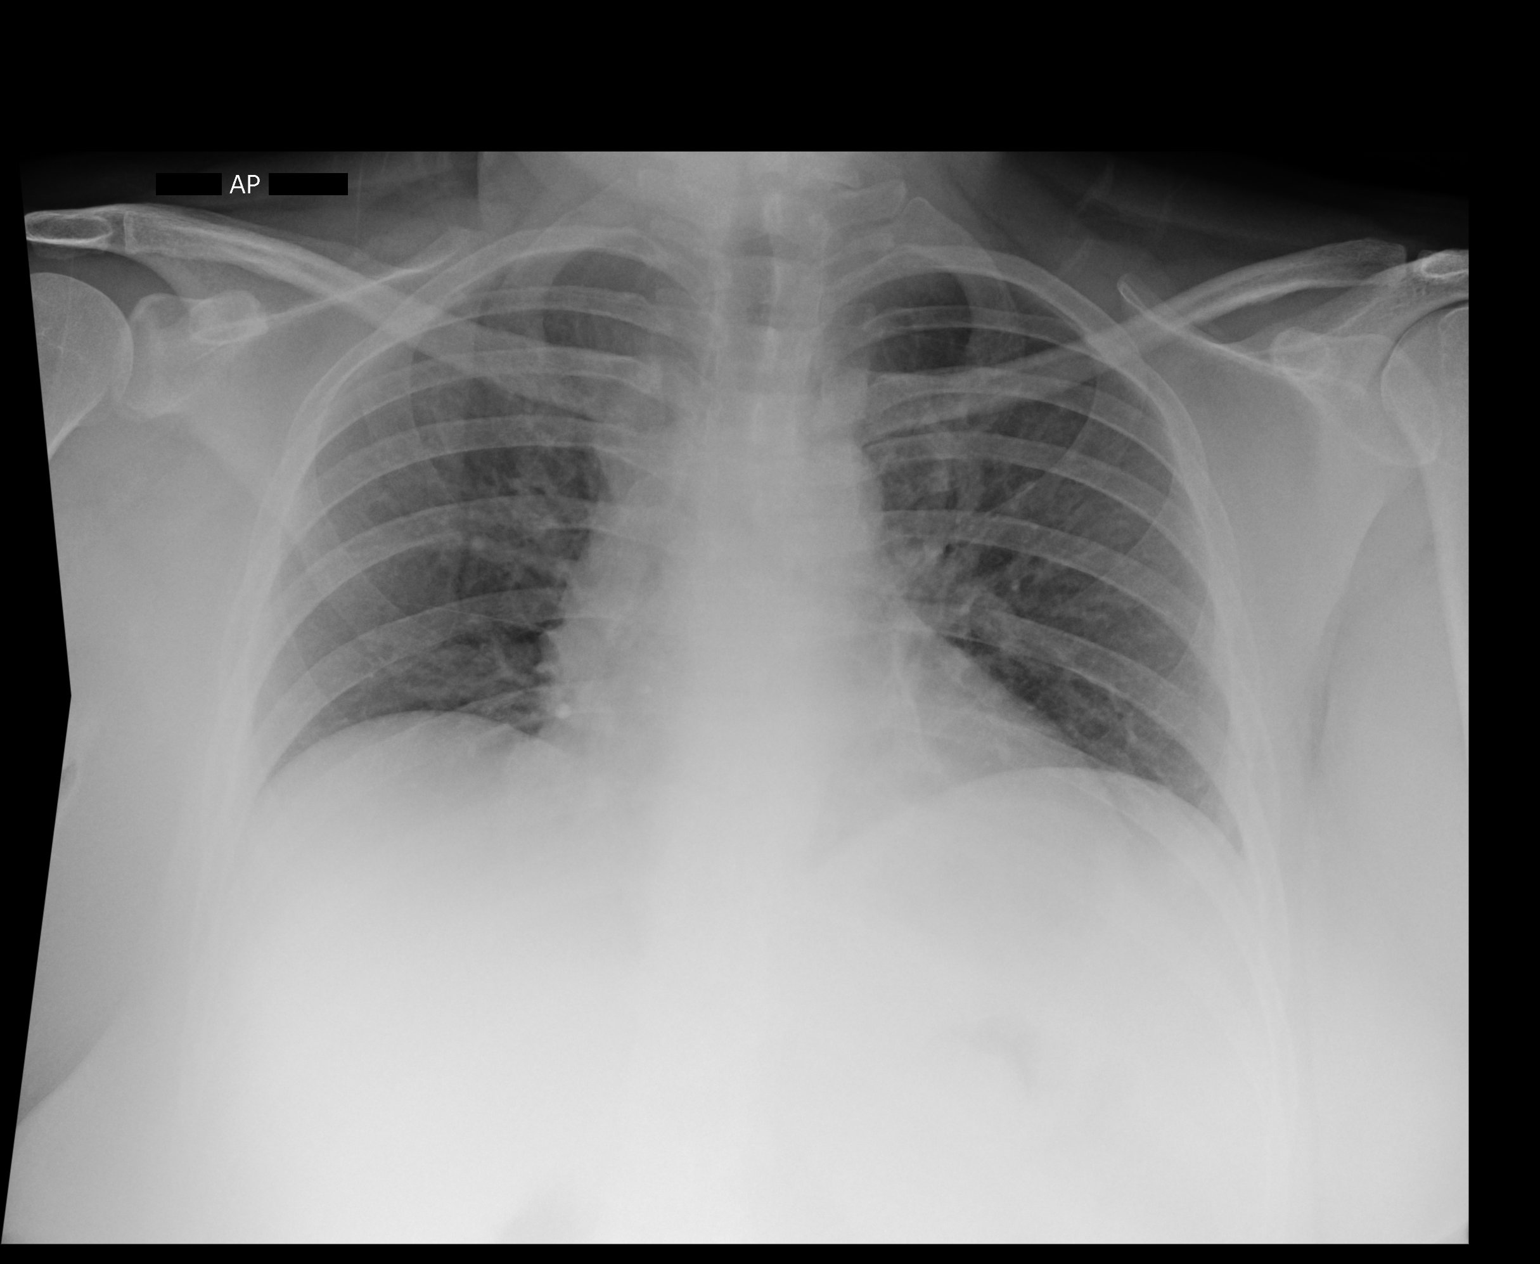

[1 of 1 positions shown; findings below may reference images not displayed]

FINDINGS: Low lung volumes are present, causing crowding of the pulmonary
vasculature. Heart size within normal limits given the low lung
volumes. Stable minimal left perihilar atelectasis. The lungs appear
otherwise clear.
IMPRESSION: 1. Low lung volumes with stable minimal left perihilar atelectasis.

## 2019-07-29 MED ORDER — ALBUTEROL SULFATE HFA 108 (90 BASE) MCG/ACT IN AERS: 2.0000 | INHALATION_SPRAY | RESPIRATORY_TRACT | Status: DC

## 2019-07-29 MED ORDER — PREDNISONE 10 MG PO TABS: ORAL_TABLET | ORAL | 0 refills | Status: DC

## 2019-07-29 MED ORDER — ALBUTEROL (5 MG/ML) CONTINUOUS INHALATION SOLN
10.0000 mg/h | INHALATION_SOLUTION | Freq: Once | RESPIRATORY_TRACT | Status: AC
  Administered 2019-07-29: 15:00:00 10 mg/h via RESPIRATORY_TRACT
  Filled 2019-07-29: qty 20

## 2019-07-29 MED ORDER — METHYLPREDNISOLONE SODIUM SUCC 125 MG IJ SOLR
125.0000 mg | Freq: Once | INTRAMUSCULAR | Status: AC
  Administered 2019-07-29: 15:00:00 125 mg via INTRAVENOUS
  Filled 2019-07-29: qty 2

## 2019-07-29 MED ORDER — IPRATROPIUM BROMIDE 0.02 % IN SOLN
1.0000 mg | Freq: Once | RESPIRATORY_TRACT | Status: AC
  Administered 2019-07-29: 15:00:00 1 mg via RESPIRATORY_TRACT
  Filled 2019-07-29: qty 5

## 2019-07-29 MED ORDER — ALBUTEROL SULFATE HFA 108 (90 BASE) MCG/ACT IN AERS
8.0000 | INHALATION_SPRAY | Freq: Once | RESPIRATORY_TRACT | Status: DC
  Filled 2019-07-29: qty 6.7

## 2019-07-29 NOTE — ED Notes (Signed)
Gave pt inhaler for home use. Pt instructed how to use inhaler.

## 2019-07-29 NOTE — Discharge Instructions (Addendum)
Nurse will give you an albuterol inhaler.  Prescription for prednisone E prescribed to your pharmacy.  Avoid dust.  Return if worse.  Name for local clinic given.

## 2019-07-29 NOTE — ED Notes (Signed)
Pt ambulated well. O2 stayed at 95%.  

## 2019-07-29 NOTE — ED Provider Notes (Signed)
Patient rechecked prior to discharge.  Pulse ox 95%.  She is feeling much better.  Discharge medication albuterol inhaler and prednisone.   Nat Christen, MD 07/29/19 [phone]

## 2019-07-29 NOTE — ED Provider Notes (Signed)
[facility] EMERGENCY DEPARTMENT Provider Note   CSN: [id number] Arrival date & time: 07/29/19  1244     History   Chief Complaint Chief Complaint  Patient presents with  . Respiratory Distress    HPI Allison Galloway is a 38 y.o. female.     HPI  Pt was seen at 1250.  Per pt, c/o gradual onset and worsening of persistent cough, wheezing and SOB that began when she woke up this morning. Pt states "they were cleaning the fans yesterday" (dusty) which she believes triggered her symptoms. Pt states she has not smoked cigarettes in the past 3 weeks. Describes her symptoms as per her previous episodes she has been seen for in the ED for this past year. Pt has not f/u with PMD or Pulm MD for definitive dx. Denies CP/palpitations, no back pain, no abd pain, no N/V/D, no fevers, no rash, no known COVID exposures. The symptoms have been associated with no other complaints. The patient has a significant history of similar symptoms previously, recently being evaluated for this complaint and multiple prior evals for same.   Marland Kitchen    History reviewed. No pertinent past medical history.  There are no active problems to display for this patient.   Past Surgical History:  Procedure Laterality Date  . ARTHROSCOPIC REPAIR ACL    . CESAREAN SECTION    . KNEE SURGERY    . TUBAL LIGATION       OB History   No obstetric history on file.      Home Medications    Prior to Admission medications   Medication Sig Start Date End Date Taking? Authorizing Provider  predniSONE (DELTASONE) 20 MG tablet Take 2 tablets (40 mg total) by mouth daily. Patient not taking: Reported on 07/29/2019 06/27/19   Kem Parkinson, PA-C    Family History History reviewed. No pertinent family history.  Social History Social History   Tobacco Use  . Smoking status: Current Some Day Smoker    Packs/day: 0.50    Types: Cigarettes  . Smokeless tobacco: Never Used  Substance Use Topics  . Alcohol use: No  . Drug  use: No     Allergies   Patient has no known allergies.   Review of Systems Review of Systems ROS: Statement: All systems negative except as marked or noted in the HPI; Constitutional: Negative for fever and chills. ; ; Eyes: Negative for eye pain, redness and discharge. ; ; ENMT: Negative for ear pain, hoarseness, nasal congestion, sinus pressure and sore throat. ; ; Cardiovascular: Negative for chest pain, palpitations, diaphoresis, and peripheral edema. ; ; Respiratory: +SOB, cough, wheezing. Negative for stridor. ; ; Gastrointestinal: Negative for nausea, vomiting, diarrhea, abdominal pain, blood in stool, hematemesis, jaundice and rectal bleeding. . ; ; Genitourinary: Negative for dysuria, flank pain and hematuria. ; ; Musculoskeletal: Negative for back pain and neck pain. Negative for swelling and trauma.; ; Skin: Negative for pruritus, rash, abrasions, blisters, bruising and skin lesion.; ; Neuro: Negative for headache, lightheadedness and neck stiffness. Negative for weakness, altered level of consciousness, altered mental status, extremity weakness, paresthesias, involuntary movement, seizure and syncope.       Physical Exam Updated Vital Signs BP (!) 187/115 (BP Location: Left Arm)   Pulse (!) 109   Resp (!) 45   Ht 5\' 3"  (1.6 m)   Wt 81.6 kg   LMP 07/01/2019   SpO2 93%   BMI 31.89 kg/m   Physical Exam 1255: Physical examination:  Nursing  notes reviewed; Vital signs and O2 SAT reviewed;  Constitutional: Well developed, Well nourished, Well hydrated, Uncomfortable appearing.;; Head:  Normocephalic, atraumatic; Eyes: EOMI, PERRL, No scleral icterus; ENMT: Mouth and pharynx normal, Mucous membranes moist; Neck: Supple, Full range of motion, No lymphadenopathy; Cardiovascular: Tachycardic rate and rhythm, No gallop; Respiratory: Breath sounds diminished & equal bilaterally, insp/exp wheezes bilat. Faint audible wheezing.  Speaking short sentences, sitting upright, tachypneic.;  Chest: Nontender, Movement normal; Abdomen: Soft, Nontender, Nondistended, Normal bowel sounds; Genitourinary: No CVA tenderness; Extremities: Pulses normal, No tenderness, No edema, No calf edema or asymmetry.; Neuro: AA&Ox3, Major CN grossly intact.  Speech clear. No gross focal motor or sensory deficits in extremities.; Skin: Color normal, Warm, Dry.     ED Treatments / Results  Labs (all labs ordered are listed, but only abnormal results are displayed)   EKG None  Radiology   Procedures Procedures (including critical care time)  Medications Ordered in ED Medications  albuterol (VENTOLIN HFA) 108 (90 Base) MCG/ACT inhaler 8 puff (8 puffs Inhalation Not Given 07/29/19 1420)  albuterol (PROVENTIL,VENTOLIN) solution continuous neb (10 mg/hr Nebulization Given 07/29/19 1432)  ipratropium (ATROVENT) nebulizer solution 1 mg (1 mg Nebulization Given 07/29/19 1432)  methylPREDNISolone sodium succinate (SOLU-MEDROL) 125 mg/2 mL injection 125 mg (125 mg Intravenous Given 07/29/19 1436)     Initial Impression / Assessment and Plan / ED Course  I have reviewed the triage vital signs and the nursing notes.  Pertinent labs & imaging results that were available during my care of the patient were reviewed by me and considered in my medical decision making (see chart for details).     MDM Reviewed: previous chart, nursing note and vitals Reviewed previous: labs Interpretation: labs and x-ray Total time providing critical care: 30-74 minutes. This excludes time spent performing separately reportable procedures and services.   CRITICAL CARE Performed by: Francine Graven Total critical care time: 35 minutes Critical care time was exclusive of separately billable procedures and treating other patients. Critical care was necessary to treat or prevent imminent or life-threatening deterioration. Critical care was time spent personally by me on the following activities: development of treatment  plan with patient and/or surrogate as well as nursing, discussions with consultants, evaluation of patient's response to treatment, examination of patient, obtaining history from patient or surrogate, ordering and performing treatments and interventions, ordering and review of laboratory studies, ordering and review of radiographic studies, pulse oximetry and re-evaluation of patient's condition.   Results for orders placed or performed during the hospital encounter of 07/29/19  SARS Coronavirus 2 [facility] order, Performed in [facility] hospital lab) Nasopharyngeal Nasopharyngeal Swab   Specimen: Nasopharyngeal Swab  Result Value Ref Range   SARS Coronavirus 2 NEGATIVE NEGATIVE  I-stat chem 8, ED (not at [facility] or [facility])  Result Value Ref Range   Sodium 140 135 - 145 mmol/L   Potassium 4.0 3.5 - 5.1 mmol/L   Chloride 105 98 - 111 mmol/L   BUN 7 6 - 20 mg/dL   Creatinine, Ser 0.80 0.44 - 1.00 mg/dL   Glucose, Bld 94 70 - 99 mg/dL   Calcium, Ion 1.17 1.15 - 1.40 mmol/L   TCO2 23 22 - 32 mmol/L   Hemoglobin 13.9 12.0 - 15.0 g/dL   HCT 41.0 36.0 - 46.0 %  I-Stat beta hCG blood, ED  Result Value Ref Range   I-stat hCG, quantitative <5.0 <5 mIU/mL   Comment 3          I-stat Creatinine, ED  Result Value Ref Range   Creatinine, Ser 0.80 0.44 - 1.00 mg/dL   Dg Chest Port 1 View Result Date: 07/29/2019 CLINICAL DATA:  Wheezing and dyspnea this morning EXAM: PORTABLE CHEST 1 VIEW COMPARISON:  06/27/2019 FINDINGS: Low lung volumes are present, causing crowding of the pulmonary vasculature. Heart size within normal limits given the low lung volumes. Stable minimal left perihilar atelectasis. The lungs appear otherwise clear. IMPRESSION: 1. Low lung volumes with stable minimal left perihilar atelectasis. Electronically Signed   By: Van Clines M.D.   On: 07/29/2019 13:49   Yarima Jandt was evaluated in Emergency Department on 07/29/2019 for the symptoms described in the history of present  illness. She was evaluated in the context of the global COVID-19 pandemic, which necessitated consideration that the patient might be at risk for infection with the SARS-CoV-2 virus that causes COVID-19. Institutional protocols and algorithms that pertain to the evaluation of patients at risk for COVID-19 are in a state of rapid change based on information released by regulatory bodies including the CDC and federal and state organizations. These policies and algorithms were followed during the patient's care in the ED.   1500:  Albuterol MDI given on arrival. COVID negative. IV solumedrol and hour long neb ordered. Pt will need to complete neb then ambulate with O2 Sat before dispo. Sign out to oncoming EDP.       Final Clinical Impressions(s) / ED Diagnoses   Final diagnoses:  None    ED Discharge Orders    None       Francine Graven, DO 07/29/19 1502

## 2019-07-29 NOTE — ED Triage Notes (Signed)
Pt states she woke up this morning with difficulty breathing and wheezing. Pt unable to finish sentences on arrival and sitting in tripod position. Audible wheezing with copious oral secretions.

## 2019-11-21 ENCOUNTER — Other Ambulatory Visit: Payer: Self-pay

## 2019-11-21 ENCOUNTER — Encounter (HOSPITAL_COMMUNITY): Payer: Self-pay | Admitting: Emergency Medicine

## 2019-11-21 ENCOUNTER — Emergency Department (HOSPITAL_COMMUNITY)
Admission: EM | Admit: 2019-11-21 | Discharge: 2019-11-21 | Disposition: A | Discharge status: Home / Self Care | Payer: Self-pay | Attending: Emergency Medicine | Admitting: Emergency Medicine

## 2019-11-21 ENCOUNTER — Emergency Department (HOSPITAL_COMMUNITY): Payer: Self-pay

## 2019-11-21 DIAGNOSIS — M25512 Pain in left shoulder: Secondary | ICD-10-CM | POA: Insufficient documentation

## 2019-11-21 DIAGNOSIS — F17210 Nicotine dependence, cigarettes, uncomplicated: Secondary | ICD-10-CM | POA: Insufficient documentation

## 2019-11-21 MED ORDER — MELOXICAM 7.5 MG PO TABS: 7.5000 mg | ORAL_TABLET | Freq: Two times a day (BID) | ORAL | 0 refills | Status: AC | PRN

## 2019-11-21 NOTE — Discharge Instructions (Signed)
You do not have any significant damage fractures or arthritis in your bones or joints.  You may very well have what is called a shoulder impingement, you will need to be seen by the orthopedist in follow-up.  Take Mobic twice a day as needed for pain, ice and rest, seek medical exam for worsening pain fever or vomiting.  Also if you develop any redness warmth or tenderness to the skin you should return immediately as well.

## 2019-11-21 NOTE — ED Provider Notes (Signed)
[facility] Provider Note   CSN: [id number] Arrival date & time: 11/21/19  1124     History Chief Complaint  Patient presents with  . Shoulder Pain    Allison Galloway is a 38 y.o. female.  HPI   This patient is a 38 year old female presenting with left shoulder pain for the last week.  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  It is worse with range of motion of the shoulder, better when she holds it still, not associated with fevers numbness or weakness of the arm.  She has had naproxen from her mother, she does not feel much better with that though it does help take the edge off.  She has never had any prior arm pain or shoulder pain and states this is somewhat immobilizing from her job because she is not able to lift the doors like she usually does.  History reviewed. No pertinent past medical history.  There are no problems to display for this patient.   Past Surgical History:  Procedure Laterality Date  . ARTHROSCOPIC REPAIR ACL    . CESAREAN SECTION    . KNEE SURGERY    . TUBAL LIGATION       OB History    Gravida  3   Para  3   Term  2   Preterm  1   AB      Living        SAB      TAB      Ectopic      Multiple      Live Births              Family History  Problem Relation Age of Onset  . Hypertension Other   . Bipolar disorder Other   . Anxiety disorder Other   . Depression Other     Social History   Tobacco Use  . Smoking status: Current Every Day Smoker    Packs/day: 0.25    Types: Cigarettes  . Smokeless tobacco: Never Used  Substance Use Topics  . Alcohol use: No  . Drug use: No    Home Medications Prior to Admission medications   Medication Sig Start Date End Date Taking? Authorizing Provider  meloxicam (MOBIC) 7.5 MG tablet Take 1 tablet (7.5 mg total) by mouth 2 (two) times daily as needed for up to  14 days for pain. 11/21/19 12/05/19  Noemi Chapel, MD    Allergies    Patient has no known allergies.  Review of Systems   Review of Systems  Constitutional: Negative for fever.  Musculoskeletal: Positive for arthralgias.  Skin: Negative for wound.  Neurological: Negative for weakness and numbness.    Physical Exam Updated Vital Signs BP (!) 154/93 (BP Location: Right Arm)   Pulse 69   Temp 98.2 F (36.8 C) (Oral)   Resp 18   Ht 1.6 m (5\' 3" )   Wt 82.1 kg   LMP 11/18/2019   SpO2 100%   BMI 32.06 kg/m   Physical Exam Vitals and nursing note reviewed.  Constitutional:      Appearance: She is well-developed. She is not diaphoretic.  HENT:     Head: Normocephalic and atraumatic.  Eyes:     General:        Right eye: No discharge.        Left eye: No discharge.  Conjunctiva/sclera: Conjunctivae normal.  Pulmonary:     Effort: Pulmonary effort is normal. No respiratory distress.  Musculoskeletal:        General: Tenderness present.     Comments: Mild tenderness in palpating the left shoulder girdle as well as the rhomboids and the anterior upper left upper chest wall.  There is no crepitance or subcutaneous emphysema, she has passive range of motion of the shoulder with minimal pain, she has tenderness with internal rotation and cannot put her hand behind her back very easily, she cannot raise her arm above her head secondary to pain, she is comfortable with her arm held at her side.  She has normal range of motion of the elbow and the wrist.  All other joints are normal.  Skin:    General: Skin is warm and dry.     Findings: No erythema or rash.     Comments: No overlying redness rash or crepitance of the skin  Neurological:     Mental Status: She is alert.     Coordination: Coordination normal.     Comments: Normal strength and sensation of the bilateral upper extremities.     ED Results / Procedures / Treatments   Labs (all labs ordered are listed, but only  abnormal results are displayed) Labs Reviewed - No data to display  EKG None  Radiology DG Shoulder Left  Result Date: 11/21/2019 CLINICAL DATA:  Left shoulder pain EXAM: LEFT SHOULDER - 2+ VIEW COMPARISON:  None. FINDINGS: Alignment is anatomic. There is no acute fracture. Joint space is preserved. No intrinsic osseous lesion. IMPRESSION: No significant osseous abnormality. Electronically Signed   By: Macy Mis M.D.   On: 11/21/2019 12:30    Procedures Procedures (including critical care time)  Medications Ordered in ED Medications - No data to display  ED Course  I have reviewed the triage vital signs and the nursing notes.  Pertinent labs & imaging results that were available during my care of the patient were reviewed by me and considered in my medical decision making (see chart for details).    MDM Rules/Calculators/A&P                       The patient may have what is likely a acute arthritis, could be related to shoulder impingement or rotator cuff injury however this does not appear consistent with a septic joint, there is not appear to be any signs of infection, there is no fracture on the x-ray, no soft tissue abnormalities.  The patient was given these results informed of the need for follow-up, referred to Dr. Aline Brochure for orthopedic follow-up locally and given a prescription for Mobic.  She is agreeable to the plan, declines sling.  Stable for discharge  Final Clinical Impression(s) / ED Diagnoses Final diagnoses:  Acute pain of left shoulder    Rx / DC Orders ED Discharge Orders         Ordered    meloxicam (MOBIC) 7.5 MG tablet  2 times daily PRN     11/21/19 1249           Noemi Chapel, MD 11/21/19 1252

## 2019-11-21 NOTE — ED Triage Notes (Signed)
Patient reports pain in her L shoulder, worsens with mvmt or picking objects up, onset over 1 week ago. No known injury.

## 2020-07-11 ENCOUNTER — Encounter (HOSPITAL_COMMUNITY): Payer: Self-pay | Admitting: *Deleted

## 2020-07-11 ENCOUNTER — Other Ambulatory Visit: Payer: Self-pay

## 2020-07-11 ENCOUNTER — Emergency Department (HOSPITAL_COMMUNITY)
Admission: EM | Admit: 2020-07-11 | Discharge: 2020-07-11 | Disposition: A | Discharge status: Home / Self Care | Payer: Self-pay | Attending: Emergency Medicine | Admitting: Emergency Medicine

## 2020-07-11 DIAGNOSIS — N939 Abnormal uterine and vaginal bleeding, unspecified: Secondary | ICD-10-CM | POA: Insufficient documentation

## 2020-07-11 DIAGNOSIS — Z5321 Procedure and treatment not carried out due to patient leaving prior to being seen by health care provider: Secondary | ICD-10-CM | POA: Insufficient documentation

## 2020-07-11 DIAGNOSIS — R109 Unspecified abdominal pain: Secondary | ICD-10-CM | POA: Insufficient documentation

## 2020-07-11 LAB — COMPREHENSIVE METABOLIC PANEL
ALT: 22 U/L (ref 0–44)
AST: 17 U/L (ref 15–41)
Albumin: 4.2 g/dL (ref 3.5–5.0)
Alkaline Phosphatase: 54 U/L (ref 38–126)
Anion gap: 9 (ref 5–15)
BUN: 12 mg/dL (ref 6–20)
CO2: 24 mmol/L (ref 22–32)
Calcium: 9.2 mg/dL (ref 8.9–10.3)
Chloride: 108 mmol/L (ref 98–111)
Creatinine, Ser: 0.86 mg/dL (ref 0.44–1.00)
GFR calc Af Amer: >60 mL/min (ref >60)
GFR calc non Af Amer: >60 mL/min (ref >60)
Glucose, Bld: 83 mg/dL (ref 70–99)
Potassium: 3.6 mmol/L (ref 3.5–5.1)
Sodium: 141 mmol/L (ref 135–145)
Total Bilirubin: 0.5 mg/dL (ref 0.3–1.2)
Total Protein: 8.2 g/dL — ABNORMAL HIGH (ref 6.5–8.1)

## 2020-07-11 LAB — URINALYSIS, ROUTINE W REFLEX MICROSCOPIC
Bacteria, UA: NONE SEEN
Bilirubin Urine: NEGATIVE
Glucose, UA: NEGATIVE mg/dL
Ketones, ur: NEGATIVE mg/dL
Leukocytes,Ua: NEGATIVE
Nitrite: NEGATIVE
Protein, ur: NEGATIVE mg/dL
Specific Gravity, Urine: 1.028 (ref 1.005–1.030)
pH: 5 (ref 5.0–8.0)

## 2020-07-11 LAB — CBC
HCT: 41.9 % (ref 36.0–46.0)
Hemoglobin: 13.5 g/dL (ref 12.0–15.0)
MCH: 28 pg (ref 26.0–34.0)
MCHC: 32.2 g/dL (ref 30.0–36.0)
MCV: 86.9 fL (ref 80.0–100.0)
Platelets: 275 10*3/uL (ref 150–400)
RBC: 4.82 MIL/uL (ref 3.87–5.11)
RDW: 13.5 % (ref 11.5–15.5)
WBC: 10.4 10*3/uL (ref 4.0–10.5)
nRBC: 0 % (ref 0.0–0.2)

## 2020-07-11 LAB — POC URINE PREG, ED: Preg Test, Ur: NEGATIVE

## 2020-07-11 LAB — LIPASE, BLOOD: Lipase: 58 U/L — ABNORMAL HIGH (ref 11–51)

## 2020-07-11 NOTE — ED Triage Notes (Signed)
Abdominal pain with vaginal bleeding 

## 2020-07-11 NOTE — ED Notes (Signed)
Per registration pt left the facility 

## 2021-11-29 DIAGNOSIS — R29810 Facial weakness: Secondary | ICD-10-CM

## 2021-11-29 HISTORY — DX: Facial weakness: R29.810

## 2021-12-10 ENCOUNTER — Encounter: Payer: Self-pay | Admitting: Family Medicine

## 2021-12-10 ENCOUNTER — Ambulatory Visit: Payer: BC Managed Care – PPO | Admitting: Family Medicine

## 2021-12-10 VITALS — BP 138/90 | HR 87 | Temp 98.6°F | Ht 63.0 in | Wt 183.6 lb

## 2021-12-10 DIAGNOSIS — F331 Major depressive disorder, recurrent, moderate: Secondary | ICD-10-CM | POA: Diagnosis not present

## 2021-12-10 DIAGNOSIS — R030 Elevated blood-pressure reading, without diagnosis of hypertension: Secondary | ICD-10-CM

## 2021-12-10 DIAGNOSIS — K5909 Other constipation: Secondary | ICD-10-CM

## 2021-12-10 DIAGNOSIS — F411 Generalized anxiety disorder: Secondary | ICD-10-CM | POA: Diagnosis not present

## 2021-12-10 DIAGNOSIS — Z7689 Persons encountering health services in other specified circumstances: Secondary | ICD-10-CM

## 2021-12-10 MED ORDER — FLUOXETINE HCL 20 MG PO CAPS: 20.0000 mg | ORAL_CAPSULE | Freq: Every day | ORAL | 2 refills | Status: DC

## 2021-12-10 MED ORDER — LUBIPROSTONE 24 MCG PO CAPS: 24.0000 ug | ORAL_CAPSULE | Freq: Two times a day (BID) | ORAL | 2 refills | Status: DC

## 2021-12-10 NOTE — Progress Notes (Signed)
Assessment & Plan:  1-2. Moderate episode of recurrent major depressive disorder (HCC)/ Generalized anxiety disorder - starting Prozac to help control anxiety and depression - FLUoxetine (PROZAC) 20 MG capsule; Take 1 capsule (20 mg total) by mouth daily.  Dispense: 30 capsule; Refill: 2  3. Chronic constipation - starting Amitiza to treat constipation  - education provided on constipation - lubiprostone (AMITIZA) 24 MCG capsule; Take 1 capsule (24 mcg total) by mouth 2 (two) times daily with a meal.  Dispense: 60 capsule; Refill: 2 - Ambulatory referral to Gastroenterology  4. Elevated BP without diagnosis of hypertension - encouraged to obtain home monitor and record readings to bring to follow-up   Return in about 6 weeks (around 01/21/2022) for follow-up of chronic medication conditions.  Lucile Crater, NP Student  I personally was present during the history, physical exam, and medical decision-making activities of this service and have verified that the service and findings are accurately documented in the nurse practitioner student's note.  [name], MSN, APRN, FNP-C [name]  Subjective:    Patient ID: Allison Galloway, female    DOB: [birth year], 41 y.o.   MRN: [id number]  Patient Care Team: Loman Brooklyn, FNP as PCP - General (Family Medicine)   Chief Complaint:  Chief Complaint  Patient presents with   New Patient (Initial Visit)    No previous pcp    Establish Care   Depression    Patient sates when she was a teen she was on medication and then stopped but now her depression and anxiety is worse.    Constipation    Patient states that since her last child which is 17 she only has  BM once in two weeks.     HPI: Allison Galloway is a 41 y.o. female presenting on 12/10/2021 for New Patient (Initial Visit) (No previous pcp ), Establish Care, Depression (Patient sates when she was a teen she was on medication and then stopped but now her  depression and anxiety is worse. ), and Constipation (Patient states that since her last child which is 17 she only has  BM once in two weeks. )  She is here today to establish care.   Anxiety and Depression: she states she previously was diagnosed as a teenager with anxiety and depression and was on Zoloft. She states she came off of the medicine because she wanted to manage it without medication. She states that lately her anxiety has been so bad that she has anger outbursts when she gets overwhelmed and it is getting uncontrollable for her. She reports getting interrupted sleep, and will wake 2-3 times a night.   Depression screen PHQ 2/9 12/10/2021  Decreased Interest 1  Down, Depressed, Hopeless 2  PHQ - 2 Score 3  Altered sleeping 1  Tired, decreased energy 3  Change in appetite 1  Feeling bad or failure about yourself  1  Trouble concentrating 2  Moving slowly or fidgety/restless 3  Suicidal thoughts 0  PHQ-9 Score 14  Difficult doing work/chores Somewhat difficult   GAD 7 : Generalized Anxiety Score 12/10/2021  Nervous, Anxious, on Edge 2  Control/stop worrying 1  Worry too much - different things 2  Trouble relaxing 2  Restless 0  Easily annoyed or irritable 1  Afraid - awful might happen 0  Total GAD 7 Score 8  Anxiety Difficulty Somewhat difficult   Constipation: She reports years of chronic constipation that started after the birth of her last child 37  years ago. She states that when she does have a bowel movement it is pebbles and she does not feel like she is emptying completely. She does take miralax daily and will also take x-lax when mirilax is not effective. She is requesting to seeing a specialist for this to figure out why this has not resolved.    Social history:  Relevant past medical, surgical, family and social history reviewed and updated as indicated. Interim medical history since our last visit reviewed.  Allergies and medications reviewed and  updated.  DATA REVIEWED: CHART IN EPIC  ROS: Negative unless specifically indicated above in HPI.    Current Outpatient Medications:    FLUoxetine (PROZAC) 20 MG capsule, Take 1 capsule (20 mg total) by mouth daily., Disp: 30 capsule, Rfl: 2   lubiprostone (AMITIZA) 24 MCG capsule, Take 1 capsule (24 mcg total) by mouth 2 (two) times daily with a meal., Disp: 60 capsule, Rfl: 2   No Known Allergies Past Medical History:  Diagnosis Date   Anxiety    Constipation    Depression     Past Surgical History:  Procedure Laterality Date   ARTHROSCOPIC REPAIR ACL     CESAREAN SECTION     KNEE SURGERY     TUBAL LIGATION      Social History   Socioeconomic History   Marital status: Single    Spouse name: Not on file   Number of children: Not on file   Years of education: Not on file   Highest education level: Not on file  Occupational History   Not on file  Tobacco Use   Smoking status: Former    Packs/day: 0.25    Types: Cigarettes    Quit date: 08/2021    Years since quitting: 0.2   Smokeless tobacco: Never  Vaping Use   Vaping Use: Never used  Substance and Sexual Activity   Alcohol use: No   Drug use: No   Sexual activity: Yes    Birth control/protection: None  Other Topics Concern   Not on file  Social History Narrative   Not on file   Social Determinants of Health   Financial Resource Strain: Not on file  Food Insecurity: Not on file  Transportation Needs: Not on file  Physical Activity: Not on file  Stress: Not on file  Social Connections: Not on file  Intimate Partner Violence: Not on file        Objective:    BP 138/90    Pulse 87    Temp 98.6 F (37 C) (Temporal)    Ht 5\' 3"  (1.6 m)    Wt 83.3 kg    SpO2 98%    BMI 32.52 kg/m   Wt Readings from Last 3 Encounters:  12/10/21 183 lb 9.6 oz (83.3 kg)  07/11/20 163 lb (73.9 kg)  11/21/19 181 lb (82.1 kg)    Physical Exam Vitals reviewed.  Constitutional:      General: She is not in acute  distress.    Appearance: Normal appearance. She is obese. She is not ill-appearing, toxic-appearing or diaphoretic.  HENT:     Head: Normocephalic and atraumatic.     Nose: Nose normal.     Mouth/Throat:     Mouth: Mucous membranes are moist.     Pharynx: Oropharynx is clear.  Eyes:     Extraocular Movements: Extraocular movements intact.     Conjunctiva/sclera: Conjunctivae normal.     Pupils: Pupils are equal, round, and reactive to  light.  Cardiovascular:     Rate and Rhythm: Normal rate and regular rhythm.     Pulses: Normal pulses.     Heart sounds: Normal heart sounds.  Pulmonary:     Effort: Pulmonary effort is normal. No respiratory distress.     Breath sounds: Normal breath sounds.  Abdominal:     General: There is no distension.     Palpations: Abdomen is soft. There is no mass.  Musculoskeletal:        General: Normal range of motion.     Cervical back: Normal range of motion.  Skin:    General: Skin is warm and dry.  Neurological:     General: No focal deficit present.     Mental Status: She is alert and oriented to person, place, and time.     Motor: No weakness.     Gait: Gait normal.  Psychiatric:        Mood and Affect: Mood normal.        Behavior: Behavior normal.        Thought Content: Thought content normal.        Judgment: Judgment normal.    No results found for: TSH Lab Results  Component Value Date   WBC 10.4 07/11/2020   HGB 13.5 07/11/2020   HCT 41.9 07/11/2020   MCV 86.9 07/11/2020   PLT 275 07/11/2020   Lab Results  Component Value Date   NA 141 07/11/2020   K 3.6 07/11/2020   CO2 24 07/11/2020   GLUCOSE 83 07/11/2020   BUN 12 07/11/2020   CREATININE 0.86 07/11/2020   BILITOT 0.5 07/11/2020   ALKPHOS 54 07/11/2020   AST 17 07/11/2020   ALT 22 07/11/2020   PROT 8.2 (H) 07/11/2020   ALBUMIN 4.2 07/11/2020   CALCIUM 9.2 07/11/2020   ANIONGAP 9 07/11/2020   No results found for: CHOL No results found for: HDL No results  found for: LDLCALC No results found for: TRIG No results found for: CHOLHDL No results found for: HGBA1C

## 2021-12-15 ENCOUNTER — Encounter: Payer: Self-pay | Admitting: Internal Medicine

## 2022-01-04 DIAGNOSIS — R0602 Shortness of breath: Secondary | ICD-10-CM | POA: Diagnosis not present

## 2022-01-04 DIAGNOSIS — R059 Cough, unspecified: Secondary | ICD-10-CM | POA: Diagnosis not present

## 2022-01-04 DIAGNOSIS — Z720 Tobacco use: Secondary | ICD-10-CM | POA: Diagnosis not present

## 2022-01-04 DIAGNOSIS — J441 Chronic obstructive pulmonary disease with (acute) exacerbation: Secondary | ICD-10-CM | POA: Diagnosis not present

## 2022-01-04 DIAGNOSIS — Z79899 Other long term (current) drug therapy: Secondary | ICD-10-CM | POA: Diagnosis not present

## 2022-01-04 DIAGNOSIS — F17210 Nicotine dependence, cigarettes, uncomplicated: Secondary | ICD-10-CM | POA: Diagnosis not present

## 2022-01-04 DIAGNOSIS — Z20822 Contact with and (suspected) exposure to covid-19: Secondary | ICD-10-CM | POA: Diagnosis not present

## 2022-01-04 DIAGNOSIS — J9811 Atelectasis: Secondary | ICD-10-CM | POA: Diagnosis not present

## 2022-01-07 ENCOUNTER — Encounter (HOSPITAL_COMMUNITY): Payer: Self-pay

## 2022-01-07 ENCOUNTER — Other Ambulatory Visit: Payer: Self-pay

## 2022-01-07 ENCOUNTER — Emergency Department (HOSPITAL_COMMUNITY): Payer: BC Managed Care – PPO

## 2022-01-07 ENCOUNTER — Emergency Department (HOSPITAL_COMMUNITY)
Admission: EM | Admit: 2022-01-07 | Discharge: 2022-01-07 | Disposition: A | Discharge status: Home / Self Care | Payer: BC Managed Care – PPO | Attending: Emergency Medicine | Admitting: Emergency Medicine

## 2022-01-07 DIAGNOSIS — R0602 Shortness of breath: Secondary | ICD-10-CM | POA: Diagnosis not present

## 2022-01-07 DIAGNOSIS — Z20822 Contact with and (suspected) exposure to covid-19: Secondary | ICD-10-CM | POA: Diagnosis not present

## 2022-01-07 DIAGNOSIS — R0600 Dyspnea, unspecified: Secondary | ICD-10-CM

## 2022-01-07 DIAGNOSIS — R062 Wheezing: Secondary | ICD-10-CM | POA: Diagnosis not present

## 2022-01-07 DIAGNOSIS — B349 Viral infection, unspecified: Secondary | ICD-10-CM | POA: Insufficient documentation

## 2022-01-07 DIAGNOSIS — M791 Myalgia, unspecified site: Secondary | ICD-10-CM | POA: Diagnosis not present

## 2022-01-07 DIAGNOSIS — R0689 Other abnormalities of breathing: Secondary | ICD-10-CM | POA: Diagnosis not present

## 2022-01-07 DIAGNOSIS — J9811 Atelectasis: Secondary | ICD-10-CM | POA: Diagnosis not present

## 2022-01-07 LAB — D-DIMER, QUANTITATIVE: D-Dimer, Quant: 0.5 ug/mL-FEU (ref 0.00–0.50)

## 2022-01-07 LAB — CBC WITH DIFFERENTIAL/PLATELET
Abs Immature Granulocytes: 0.08 10*3/uL — ABNORMAL HIGH (ref 0.00–0.07)
Basophils Absolute: 0 10*3/uL (ref 0.0–0.1)
Basophils Relative: 0 %
Eosinophils Absolute: 0 10*3/uL (ref 0.0–0.5)
Eosinophils Relative: 0 %
HCT: 41 % (ref 36.0–46.0)
Hemoglobin: 13.1 g/dL (ref 12.0–15.0)
Immature Granulocytes: 1 %
Lymphocytes Relative: 12 %
Lymphs Abs: 1.1 10*3/uL (ref 0.7–4.0)
MCH: 27.5 pg (ref 26.0–34.0)
MCHC: 32 g/dL (ref 30.0–36.0)
MCV: 86 fL (ref 80.0–100.0)
Monocytes Absolute: 0.2 10*3/uL (ref 0.1–1.0)
Monocytes Relative: 2 %
Neutro Abs: 7.4 10*3/uL (ref 1.7–7.7)
Neutrophils Relative %: 85 %
Platelets: 243 10*3/uL (ref 150–400)
RBC: 4.77 MIL/uL (ref 3.87–5.11)
RDW: 13.2 % (ref 11.5–15.5)
WBC: 8.7 10*3/uL (ref 4.0–10.5)
nRBC: 0 % (ref 0.0–0.2)

## 2022-01-07 LAB — COMPREHENSIVE METABOLIC PANEL
ALT: 20 U/L (ref 0–44)
AST: 18 U/L (ref 15–41)
Albumin: 3.9 g/dL (ref 3.5–5.0)
Alkaline Phosphatase: 51 U/L (ref 38–126)
Anion gap: 8 (ref 5–15)
BUN: 13 mg/dL (ref 6–20)
CO2: 20 mmol/L — ABNORMAL LOW (ref 22–32)
Calcium: 8.8 mg/dL — ABNORMAL LOW (ref 8.9–10.3)
Chloride: 109 mmol/L (ref 98–111)
Creatinine, Ser: 0.88 mg/dL (ref 0.44–1.00)
GFR, Estimated: >60 mL/min (ref >60)
Glucose, Bld: 168 mg/dL — ABNORMAL HIGH (ref 70–99)
Potassium: 3.5 mmol/L (ref 3.5–5.1)
Sodium: 137 mmol/L (ref 135–145)
Total Bilirubin: 0.5 mg/dL (ref 0.3–1.2)
Total Protein: 7.2 g/dL (ref 6.5–8.1)

## 2022-01-07 LAB — BRAIN NATRIURETIC PEPTIDE: B Natriuretic Peptide: 123 pg/mL — ABNORMAL HIGH (ref 0.0–100.0)

## 2022-01-07 LAB — RESP PANEL BY RT-PCR (FLU A&B, COVID) ARPGX2
Influenza A by PCR: NEGATIVE
Influenza B by PCR: NEGATIVE
SARS Coronavirus 2 by RT PCR: NEGATIVE

## 2022-01-07 LAB — TROPONIN I (HIGH SENSITIVITY)
Troponin I (High Sensitivity): 3 ng/L (ref <18)
Troponin I (High Sensitivity): 2 ng/L (ref <18)

## 2022-01-07 IMAGING — DX DG CHEST 1V PORT
1 series · 1 of 1 positions shown · non-contrast
Comparison: Chest x-ray dated [DATE]

CLINICAL DATA: Shortness of breath

EXAM:
PORTABLE CHEST 1 VIEW

[chest ap]
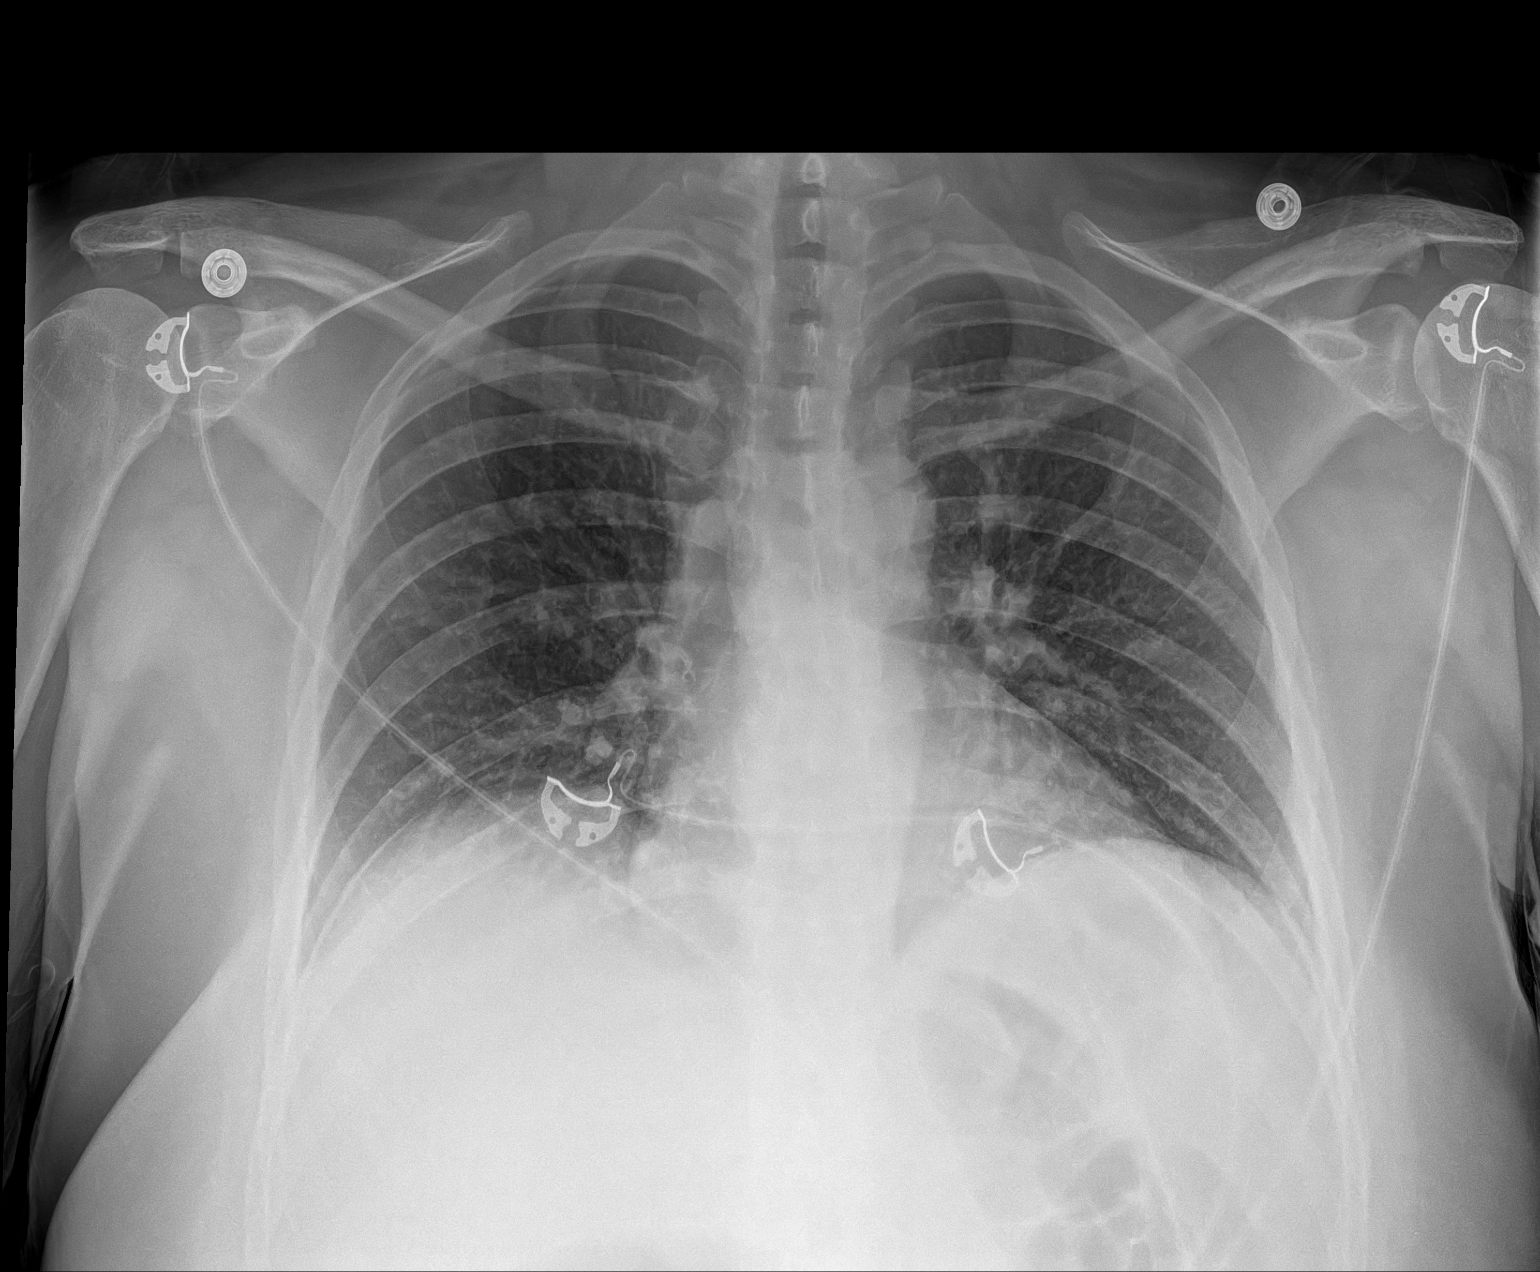

[1 of 1 positions shown; findings below may reference images not displayed]

FINDINGS: Cardiac and mediastinal contours within normal limits. Mild
bibasilar atelectasis. No focal consolidation. No large pleural
effusion or evidence of pneumothorax.
IMPRESSION: No active disease.

## 2022-01-07 MED ORDER — HYDROMORPHONE HCL 1 MG/ML IJ SOLN
0.5000 mg | Freq: Once | INTRAMUSCULAR | Status: AC
  Administered 2022-01-07: 0.5 mg via INTRAVENOUS
  Filled 2022-01-07: qty 1

## 2022-01-07 MED ORDER — ONDANSETRON HCL 4 MG/2ML IJ SOLN
4.0000 mg | Freq: Once | INTRAMUSCULAR | Status: AC
  Administered 2022-01-07: 4 mg via INTRAVENOUS
  Filled 2022-01-07: qty 2

## 2022-01-07 NOTE — ED Triage Notes (Signed)
Pt arrived by [facility] from work with complaints of SOB. States that she has been having symptoms of fever and body aches for the past 4 days. Was recently seen at hospital (01/04/2022) for similar symptoms. Was screened for Covid and tested negative. Pt was 96 on RM air on arrival.

## 2022-01-07 NOTE — Discharge Instructions (Signed)
Drink plenty of fluids.  Follow-up with your family doctor next week.  And you will be getting a call from cardiology that he can be followed up.

## 2022-01-07 NOTE — ED Provider Notes (Signed)
[facility] EMERGENCY DEPARTMENT Provider Note   CSN: [id number] Arrival date & time: 01/07/22  1093     History  Chief Complaint  Patient presents with   Shortness of Breath    Allison Galloway is a 41 y.o. female.  Patient complains of fever weakness and chest tightness for 4 days.  Patient has a history of anxiety and depression  The history is provided by the patient and medical records. No language interpreter was used.  Shortness of Breath Severity:  Mild Onset quality:  Sudden Timing:  Intermittent Progression:  Waxing and waning Chronicity:  New Context: not activity   Relieved by:  Nothing Worsened by:  Nothing Ineffective treatments:  None tried Associated symptoms: no abdominal pain, no chest pain, no cough, no headaches and no rash       Home Medications Prior to Admission medications   Medication Sig Start Date End Date Taking? Authorizing Provider  albuterol (VENTOLIN HFA) 108 (90 Base) MCG/ACT inhaler Inhale 1-2 puffs into the lungs every 6 (six) hours as needed for wheezing or shortness of breath. 01/04/22 01/04/23 Yes [provider]  doxycycline (VIBRAMYCIN) 100 MG capsule Take 100 mg by mouth 2 (two) times daily. 01/04/22  Yes [provider]  FLUoxetine (PROZAC) 20 MG capsule Take 1 capsule (20 mg total) by mouth daily. 12/10/21  Yes Loman Brooklyn, FNP  lubiprostone (AMITIZA) 24 MCG capsule Take 1 capsule (24 mcg total) by mouth 2 (two) times daily with a meal. 12/10/21  Yes [name] F, FNP  predniSONE (DELTASONE) 20 MG tablet Take 20 mg by mouth 2 (two) times daily. 01/04/22  Yes [provider]      Allergies    Patient has no known allergies.    Review of Systems   Review of Systems  Constitutional:  Positive for fatigue. Negative for appetite change.  HENT:  Negative for congestion, ear discharge and sinus pressure.   Eyes:  Negative for discharge.  Respiratory:  Positive for shortness of breath. Negative for cough.    Cardiovascular:  Negative for chest pain.  Gastrointestinal:  Negative for abdominal pain and diarrhea.  Genitourinary:  Negative for frequency and hematuria.  Musculoskeletal:  Negative for back pain.  Skin:  Negative for rash.  Neurological:  Negative for seizures and headaches.  Psychiatric/Behavioral:  Negative for hallucinations.    Physical Exam Updated Vital Signs BP (!) 165/93    Pulse 76    Temp 98.1 F (36.7 C) (Oral)    Resp 13    Ht 5\' 3"  (1.6 m)    Wt 81.2 kg    SpO2 98%    BMI 31.71 kg/m  Physical Exam Vitals and nursing note reviewed.  Constitutional:      Appearance: She is well-developed.  HENT:     Head: Normocephalic.     Nose: Nose normal.  Eyes:     General: No scleral icterus.    Conjunctiva/sclera: Conjunctivae normal.  Neck:     Thyroid: No thyromegaly.  Cardiovascular:     Rate and Rhythm: Normal rate and regular rhythm.     Heart sounds: No murmur heard.   No friction rub. No gallop.  Pulmonary:     Breath sounds: No stridor. No wheezing or rales.  Chest:     Chest wall: No tenderness.  Abdominal:     General: There is no distension.     Tenderness: There is no abdominal tenderness. There is no rebound.  Musculoskeletal:  General: Normal range of motion.     Cervical back: Neck supple.  Lymphadenopathy:     Cervical: No cervical adenopathy.  Skin:    Findings: No erythema or rash.  Neurological:     Mental Status: She is oriented to person, place, and time.     Motor: No abnormal muscle tone.     Coordination: Coordination normal.  Psychiatric:        Behavior: Behavior normal.    ED Results / Procedures / Treatments   Labs (all labs ordered are listed, but only abnormal results are displayed) Labs Reviewed  CBC WITH DIFFERENTIAL/PLATELET - Abnormal; Notable for the following components:      Result Value   Abs Immature Granulocytes 0.08 (*)    All other components within normal limits  COMPREHENSIVE METABOLIC PANEL -  Abnormal; Notable for the following components:   CO2 20 (*)    Glucose, Bld 168 (*)    Calcium 8.8 (*)    All other components within normal limits  BRAIN NATRIURETIC PEPTIDE - Abnormal; Notable for the following components:   B Natriuretic Peptide 123.0 (*)    All other components within normal limits  RESP PANEL BY RT-PCR (FLU A&B, COVID) [id number]  D-DIMER, QUANTITATIVE  TROPONIN I (HIGH SENSITIVITY)  TROPONIN I (HIGH SENSITIVITY)    EKG EKG Interpretation  Date/Time:  Thursday January 07 2022 07:06:27 EST Ventricular Rate:  75 PR Interval:  132 QRS Duration: 104 QT Interval:  431 QTC Calculation: 482 R Axis:   14 Text Interpretation: Sinus rhythm Minimal ST elevation, inferior leads Confirmed by Milton Ferguson [phone]) on 01/07/2022 9:57:09 AM  Radiology DG Chest Port 1 View  Result Date: 01/07/2022 CLINICAL DATA:  Shortness of breath EXAM: PORTABLE CHEST 1 VIEW COMPARISON:  Chest x-ray dated January 04, 2022 FINDINGS: Cardiac and mediastinal contours within normal limits. Mild bibasilar atelectasis. No focal consolidation. No large pleural effusion or evidence of pneumothorax. IMPRESSION: No active disease. Electronically Signed   By: Yetta Glassman M.D.   On: 01/07/2022 08:06    Procedures Procedures    Medications Ordered in ED Medications  HYDROmorphone (DILAUDID) injection 0.5 mg (0.5 mg Intravenous Given 01/07/22 0822)  ondansetron (ZOFRAN) injection 4 mg (4 mg Intravenous Given 01/07/22 5329)    ED Course/ Medical Decision Making/ A&P                           Medical Decision Making Amount and/or Complexity of Data Reviewed Labs: ordered. Radiology: ordered.  Risk Prescription drug management.   Patient with viral syndrome.  Patient also had some moderate nonspecific changes on EKG.  I spoke with cardiology and they will follow-up with her as an outpatient   This patient presents to the ED for concern of fever and aches and shortness of breath, this  involves an extensive number of treatment options, and is a complaint that carries with it a high risk of complications and morbidity.  The differential diagnosis includes pneumonia, viral syndrome,   Co morbidities that complicate the patient evaluation  Anxiety and depression   Additional history obtained:  Additional history obtained from patient External records from outside source obtained and reviewed including hospital records   Lab Tests:  I Ordered, and personally interpreted labs.  The pertinent results include: CBC and chemistries which showed mild elevated glucose   Imaging Studies ordered:  I ordered imaging studies including chest x-ray I independently visualized and interpreted imaging which showed  negative I agree with the radiologist interpretation   Cardiac Monitoring:  The patient was maintained on a cardiac monitor.  I personally viewed and interpreted the cardiac monitored which showed an underlying rhythm of: Normal sinus rhythm   Medicines ordered and prescription drug management:  I ordered medication including Dilaudid for pain and Zofran for nausea Reevaluation of the patient after these medicines showed that the patient improved I have reviewed the patients home medicines and have made adjustments as needed   Test Considered:  CT chest   Critical Interventions:  None   Consultations Obtained:  I requested consultation with the cardiology,  and discussed lab and imaging findings as well as pertinent plan - they recommend: Discharged home with follow-up in the office for the nonspecific ST-T wave changes   Problem List / ED Course:  Myalgias, shortness of breath   Reevaluation:  After the interventions noted above, I reevaluated the patient and found that they have :improved   Social Determinants of Health:  None   Dispostion:  After consideration of the diagnostic results and the patients response to treatment, I feel  that the patent would benefit from discharge home follow-up with cardiology.         Final Clinical Impression(s) / ED Diagnoses Final diagnoses:  Dyspnea, unspecified type  Myalgia    Rx / DC Orders ED Discharge Orders     None         Milton Ferguson, MD 01/07/22 1821

## 2022-01-10 DIAGNOSIS — R0789 Other chest pain: Secondary | ICD-10-CM | POA: Diagnosis not present

## 2022-01-10 DIAGNOSIS — J209 Acute bronchitis, unspecified: Secondary | ICD-10-CM | POA: Diagnosis not present

## 2022-01-10 DIAGNOSIS — F17210 Nicotine dependence, cigarettes, uncomplicated: Secondary | ICD-10-CM | POA: Diagnosis not present

## 2022-01-10 DIAGNOSIS — R0602 Shortness of breath: Secondary | ICD-10-CM | POA: Diagnosis not present

## 2022-01-10 DIAGNOSIS — Z7952 Long term (current) use of systemic steroids: Secondary | ICD-10-CM | POA: Diagnosis not present

## 2022-01-10 DIAGNOSIS — Z20822 Contact with and (suspected) exposure to covid-19: Secondary | ICD-10-CM | POA: Diagnosis not present

## 2022-01-10 DIAGNOSIS — R0600 Dyspnea, unspecified: Secondary | ICD-10-CM | POA: Diagnosis not present

## 2022-01-12 ENCOUNTER — Ambulatory Visit (INDEPENDENT_AMBULATORY_CARE_PROVIDER_SITE_OTHER): Payer: BC Managed Care – PPO | Admitting: Nurse Practitioner

## 2022-01-12 ENCOUNTER — Encounter: Payer: Self-pay | Admitting: Nurse Practitioner

## 2022-01-12 VITALS — BP 153/103 | HR 78 | Temp 99.0°F | Ht 63.0 in | Wt 179.0 lb

## 2022-01-12 DIAGNOSIS — J209 Acute bronchitis, unspecified: Secondary | ICD-10-CM | POA: Diagnosis not present

## 2022-01-12 MED ORDER — BENZONATATE 100 MG PO CAPS: 100.0000 mg | ORAL_CAPSULE | Freq: Three times a day (TID) | ORAL | 0 refills | Status: DC | PRN

## 2022-01-12 NOTE — Progress Notes (Signed)
Acute Office Visit  Subjective:    Patient ID: Allison Galloway, female    DOB: 1981-09-15, 41 y.o.   MRN: [id number]  Chief Complaint  Patient presents with   Shortness of Breath    Shortness of Breath This is a recurrent problem. The current episode started in the past 7 days. The problem occurs constantly. The problem has been gradually worsening. Associated symptoms include syncope. Pertinent negatives include no abdominal pain, chest pain, claudication, coryza, ear pain, fever, hemoptysis, leg pain, leg swelling, rash, sore throat or sputum production. Nothing aggravates the symptoms. She has tried nothing for the symptoms.   Past Medical History:  Diagnosis Date   Anxiety    Constipation    Depression     Past Surgical History:  Procedure Laterality Date   ARTHROSCOPIC REPAIR ACL     CESAREAN SECTION     KNEE SURGERY     TUBAL LIGATION      Family History  Problem Relation Age of Onset   Diabetes Mother    Depression Mother    Arthritis Mother    Asthma Mother    Learning disabilities Mother    Mental illness Mother    Depression Sister    Asthma Sister    Mental illness Sister    Seizures Daughter    Hypertension Other    Bipolar disorder Other    Anxiety disorder Other    Depression Other     Social History   Socioeconomic History   Marital status: Single    Spouse name: Not on file   Number of children: Not on file   Years of education: Not on file   Highest education level: Not on file  Occupational History   Not on file  Tobacco Use   Smoking status: Former    Packs/day: 0.25    Types: Cigarettes    Quit date: 08/2021    Years since quitting: 0.3   Smokeless tobacco: Never  Vaping Use   Vaping Use: Never used  Substance and Sexual Activity   Alcohol use: No   Drug use: No   Sexual activity: Yes    Birth control/protection: None  Other Topics Concern   Not on file  Social History Narrative   Not on file   Social Determinants of  Health   Financial Resource Strain: Not on file  Food Insecurity: Not on file  Transportation Needs: Not on file  Physical Activity: Not on file  Stress: Not on file  Social Connections: Not on file  Intimate Partner Violence: Not on file    Outpatient Medications Prior to Visit  Medication Sig Dispense Refill   albuterol (VENTOLIN HFA) 108 (90 Base) MCG/ACT inhaler Inhale 1-2 puffs into the lungs every 6 (six) hours as needed for wheezing or shortness of breath.     cefdinir (OMNICEF) 300 MG capsule Take 300 mg by mouth 2 (two) times daily.     doxycycline (VIBRAMYCIN) 100 MG capsule Take 100 mg by mouth 2 (two) times daily.     FLUoxetine (PROZAC) 20 MG capsule Take 1 capsule (20 mg total) by mouth daily. 30 capsule 2   ipratropium-albuterol (DUONEB) 0.5-2.5 (3) MG/3ML SOLN Take by nebulization.     lubiprostone (AMITIZA) 24 MCG capsule Take 1 capsule (24 mcg total) by mouth 2 (two) times daily with a meal. 60 capsule 2   predniSONE (DELTASONE) 20 MG tablet Take 20 mg by mouth 2 (two) times daily.     No facility-administered medications  prior to visit.    No Known Allergies  Review of Systems  Constitutional:  Negative for fever.  HENT:  Negative for ear pain and sore throat.   Eyes: Negative.   Respiratory:  Positive for shortness of breath. Negative for hemoptysis and sputum production.   Cardiovascular:  Positive for syncope. Negative for chest pain, claudication and leg swelling.  Gastrointestinal:  Negative for abdominal pain.  Skin: Negative.  Negative for rash.  All other systems reviewed and are negative.     Objective:    Physical Exam Vitals reviewed.  Constitutional:      Appearance: Normal appearance. She is well-developed.  HENT:     Head: Normocephalic.     Right Ear: External ear normal.     Left Ear: External ear normal.     Nose: Congestion present.     Mouth/Throat:     Mouth: Mucous membranes are moist.     Pharynx: Oropharynx is clear.  Eyes:      Conjunctiva/sclera: Conjunctivae normal.  Cardiovascular:     Rate and Rhythm: Normal rate and regular rhythm.     Pulses: Normal pulses.     Heart sounds: Normal heart sounds.  Pulmonary:     Effort: Pulmonary effort is normal. No respiratory distress.     Breath sounds: Wheezing present.  Chest:     Chest wall: No tenderness.  Abdominal:     General: Bowel sounds are normal.  Skin:    General: Skin is warm.     Findings: No rash.  Neurological:     General: No focal deficit present.     Mental Status: She is alert and oriented to person, place, and time. Mental status is at baseline.  Psychiatric:        Mood and Affect: Mood normal.        Behavior: Behavior normal.    BP (!) 153/103    Pulse 78    Temp 99 F (37.2 C)    Ht 5\' 3"  (1.6 m)    Wt 179 lb (81.2 kg)    SpO2 99%    BMI 31.71 kg/m  Wt Readings from Last 3 Encounters:  01/12/22 179 lb (81.2 kg)  01/07/22 179 lb (81.2 kg)  12/10/21 183 lb 9.6 oz (83.3 kg)    Health Maintenance Due  Topic Date Due   COVID-19 Vaccine (1) Never done   MAMMOGRAM  Never done   PAP SMEAR-Modifier  Never done    There are no preventive care reminders to display for this patient.   No results found for: TSH Lab Results  Component Value Date   WBC 8.7 01/07/2022   HGB 13.1 01/07/2022   HCT 41.0 01/07/2022   MCV 86.0 01/07/2022   PLT 243 01/07/2022   Lab Results  Component Value Date   NA 137 01/07/2022   K 3.5 01/07/2022   CO2 20 (L) 01/07/2022   GLUCOSE 168 (H) 01/07/2022   BUN 13 01/07/2022   CREATININE 0.88 01/07/2022   BILITOT 0.5 01/07/2022   ALKPHOS 51 01/07/2022   AST 18 01/07/2022   ALT 20 01/07/2022   PROT 7.2 01/07/2022   ALBUMIN 3.9 01/07/2022   CALCIUM 8.8 (L) 01/07/2022   ANIONGAP 8 01/07/2022   No results found for: CHOL No results found for: HDL No results found for: LDLCALC No results found for: TRIG No results found for: CHOLHDL No results found for: HGBA1C     Assessment & Plan:   Un-resolved cough and  wheezing from recent acute bronchitis.  Patient is currently on steroid and albuterol, resolving.   Persistent cough is not improving. No  fever or nausea associated with concern.  Started patient on Tessalon Perles 100 mg tablet by mouth as needed for cough. Patient is to follow-up with worsening hours of symptoms.   Problem List Items Addressed This Visit   None Visit Diagnoses     Acute bronchitis with wheezing    -  Primary   Relevant Medications   benzonatate (TESSALON PERLES) 100 MG capsule        Meds ordered this encounter  Medications   benzonatate (TESSALON PERLES) 100 MG capsule    Sig: Take 1 capsule (100 mg total) by mouth 3 (three) times daily as needed for cough.    Dispense:  20 capsule    Refill:  0    Order Specific Question:   Supervising Provider    Answer:   Claretta Fraise [[id number]]     Ivy Lynn, NP

## 2022-01-12 NOTE — Patient Instructions (Signed)
Acute Bronchitis, Adult ?Acute bronchitis is when air tubes in the lungs (bronchi) suddenly get swollen. The condition can make it hard for you to breathe. In adults, acute bronchitis usually goes away within 2 weeks. A cough caused by bronchitis may last up to 3 weeks. Smoking, allergies, and asthma can make the condition worse. ?What are the causes? ?Germs that cause cold and flu (viruses). The most common cause of this condition is the virus that causes the common cold. ?Bacteria. ?Substances that bother (irritate) the lungs, including: ?Smoke from cigarettes and other types of tobacco. ?Dust and pollen. ?Fumes from chemicals, gases, or burned fuel. ?Indoor or outdoor air pollution. ?What increases the risk? ?A weak body's defense system. This is also called the immune system. ?Any condition that affects your lungs and breathing, such as asthma. ?What are the signs or symptoms? ?A cough. ?Coughing up clear, yellow, or green mucus. ?Making high-pitched whistling sounds when you breathe, most often when you breathe out (wheezing). ?Runny or stuffy nose. ?Having too much mucus in your lungs (chest congestion). ?Shortness of breath. ?Body aches. ?A sore throat. ?How is this treated? ?Acute bronchitis may go away over time without treatment. Your doctor may tell you to: ?Drink more fluids. This will help thin your mucus so it is easier to cough up. ?Use a device that gets medicine into your lungs (inhaler). ?Use a vaporizer or a humidifier. These are machines that add water to the air. This helps with coughing and poor breathing. ?Take a medicine that thins mucus and helps clear it from your lungs. ?Take a medicine that prevents or stops coughing. ?It is not common to take an antibiotic medicine for this condition. ?Follow these instructions at home: ? ?Take over-the-counter and prescription medicines only as told by your doctor. ?Use an inhaler, vaporizer, or humidifier as told by your doctor. ?Take two teaspoons (10  mL) of honey at bedtime. This helps lessen your coughing at night. ?Drink enough fluid to keep your pee (urine) pale yellow. ?Do not smoke or use any products that contain nicotine or tobacco. If you need help quitting, ask your doctor. ?Get a lot of rest. ?Return to your normal activities when your doctor says that it is safe. ?Keep all follow-up visits. ?How is this prevented? ? ?Wash your hands often with soap and water for at least 20 seconds. If you cannot use soap and water, use hand sanitizer. ?Avoid contact with people who have cold symptoms. ?Try not to touch your mouth, nose, or eyes with your hands. ?Avoid breathing in smoke or chemical fumes. ?Make sure to get the flu shot every year. ?Contact a doctor if: ?Your symptoms do not get better in 2 weeks. ?You have trouble coughing up the mucus. ?Your cough keeps you awake at night. ?You have a fever. ?Get help right away if: ?You cough up blood. ?You have chest pain. ?You have very bad shortness of breath. ?You faint or keep feeling like you are going to faint. ?You have a very bad headache. ?Your fever or chills get worse. ?These symptoms may be an emergency. Get help right away. Call your local emergency services (911 in the [place] ?Do not wait to see if the symptoms will go away. ?Do not drive yourself to the hospital. ?Summary ?Acute bronchitis is when air tubes in the lungs (bronchi) suddenly get swollen. In adults, acute bronchitis usually goes away within 2 weeks. ?Drink more fluids. This will help thin your mucus so it is easier to   cough up. ?Take over-the-counter and prescription medicines only as told by your doctor. ?Contact a doctor if your symptoms do not improve after 2 weeks of treatment. ?This information is not intended to replace advice given to you by your health care provider. Make sure you discuss any questions you have with your health care provider. ?Document Revised: 03/18/2021 Document Reviewed: 03/18/2021 ?Elsevier Patient Education  ? 2022 Elsevier Inc. ? ?

## 2022-01-13 ENCOUNTER — Telehealth: Payer: Self-pay | Admitting: Pulmonary Disease

## 2022-01-13 NOTE — Telephone Encounter (Signed)
Rec'd disability form via fax from [employer].  Patient is scheduled to see Dr. Erin Fulling for the first time on 2/23.  Emailed the form to [employer] for completion after the appointment.

## 2022-01-17 DIAGNOSIS — R079 Chest pain, unspecified: Secondary | ICD-10-CM | POA: Diagnosis not present

## 2022-01-17 DIAGNOSIS — J370 Chronic laryngitis: Secondary | ICD-10-CM | POA: Diagnosis not present

## 2022-01-17 DIAGNOSIS — R0789 Other chest pain: Secondary | ICD-10-CM | POA: Diagnosis not present

## 2022-01-17 DIAGNOSIS — R0600 Dyspnea, unspecified: Secondary | ICD-10-CM | POA: Diagnosis not present

## 2022-01-17 DIAGNOSIS — R0602 Shortness of breath: Secondary | ICD-10-CM | POA: Diagnosis not present

## 2022-01-18 DIAGNOSIS — R079 Chest pain, unspecified: Secondary | ICD-10-CM | POA: Diagnosis not present

## 2022-01-18 DIAGNOSIS — R0602 Shortness of breath: Secondary | ICD-10-CM | POA: Diagnosis not present

## 2022-01-20 ENCOUNTER — Encounter: Payer: Self-pay | Admitting: Family Medicine

## 2022-01-20 ENCOUNTER — Ambulatory Visit: Payer: BC Managed Care – PPO | Admitting: Family Medicine

## 2022-01-20 VITALS — BP 149/101 | HR 91 | Temp 98.4°F | Ht 63.0 in | Wt 175.6 lb

## 2022-01-20 DIAGNOSIS — R0602 Shortness of breath: Secondary | ICD-10-CM | POA: Diagnosis not present

## 2022-01-20 NOTE — Progress Notes (Signed)
Assessment & Plan:  1. Shortness of breath Unclear explanation of shortness of breath, but patient is hemodynamically stable. Encouraged to follow-up with pulmonology tomorrow as scheduled. Advised to stop Amitiza and decrease Prozac to every other day x2 weeks and then stop; we are stopping both of these medications to see if they have contributed to her sudden onset of shortness of breath.  Continue to use albuterol and DuoNebs as needed.   [name], MSN, APRN, FNP-C [name]  Subjective:   Patient ID: Allison Galloway, female    DOB: [birth year], 41 y.o.   MRN: [id number]  HPI: Allison Galloway is a 41 y.o. female presenting on 01/20/2022 for Shortness of Breath  Patient is here due to shortness of breath. This started a little more than three weeks ago. She was initially seen at [facility] ER on 01/04/2022 and diagnosed with a COPD exacerbation. She was treated with Rocephin IV, Azithromycin IV, Duo-neb, and Solu-Medrol. She was discharged with prescriptions for Doxycycline and Prednisone. Chest x-ray at that time showed no acute abnormalities. CBC and CMP were without significant abnormalities. COVID, influenza and RSV were negative.   She was then seen again on 01/07/2022 at [facility] with continued concerns regarding shortness of breath.  Troponin, BNP, D-dimer, CMP, and CBC without any significant abnormalities except an elevated glucose.  COVID and influenza testing were negative.  Chest x-ray without any acute abnormalities.  Her EKG showed a nonspecific ST T wave change for which she is supposed to follow-up with cardiology on.  She has an appointment with cardiology scheduled for 02/26/2022.  She had a visit in our office on 01/12/2022 for continued shortness of breath.  She was prescribed Tessalon Perles.  Her third ER visit was at [facility] on 01/17/2022.  Chest x-ray and chest CT without any acute abnormalities.  Echo showed a normal  EF with no effusion or evidence of right heart strain.  EKG was a normal sinus rhythm without previously noted abnormalities.  Troponin, D-dimer, BNP without any significant abnormalities.  She did have a mild elevation in her WBC and an elevated glucose level.  She was treated with albuterol, Atrovent, and Solu-Medrol.  Today she reports she has been compliant with everything that has been prescribed to her and she continues to experience shortness of breath.  She has both albuterol as an inhaler and DuoNeb which she reports is somewhat helpful.  She does feel like the DuoNebs helped better than the albuterol.  She gives out very easily.  She is no longer smoking or vaping.  She previously quit smoking and went to vaping, but has not vaped in over a month.  She was started on Prozac and Amitiza approximately 3 weeks prior to the onset of all of this.  She has an appointment scheduled with pulmonology tomorrow.   ROS: Negative unless specifically indicated above in HPI.   Relevant past medical history reviewed and updated as indicated.   Allergies and medications reviewed and updated.   Current Outpatient Medications:    albuterol (VENTOLIN HFA) 108 (90 Base) MCG/ACT inhaler, Inhale 1-2 puffs into the lungs every 6 (six) hours as needed for wheezing or shortness of breath., Disp: , Rfl:    FLUoxetine (PROZAC) 20 MG capsule, Take 1 capsule (20 mg total) by mouth daily., Disp: 30 capsule, Rfl: 2   ipratropium-albuterol (DUONEB) 0.5-2.5 (3) MG/3ML SOLN, Take by nebulization., Disp: , Rfl:    lubiprostone (AMITIZA) 24 MCG  capsule, Take 1 capsule (24 mcg total) by mouth 2 (two) times daily with a meal., Disp: 60 capsule, Rfl: 2  No Known Allergies  Objective:   BP (!) 149/101    Pulse 91    Temp 98.4 F (36.9 C) (Temporal)    Ht 5\' 3"  (1.6 m)    Wt 175 lb 9.6 oz (79.7 kg)    SpO2 98%    BMI 31.11 kg/m    Physical Exam Vitals reviewed.  Constitutional:      General: She is not in acute  distress.    Appearance: Normal appearance. She is not ill-appearing, toxic-appearing or diaphoretic.  HENT:     Head: Normocephalic and atraumatic.  Eyes:     General: No scleral icterus.       Right eye: No discharge.        Left eye: No discharge.     Conjunctiva/sclera: Conjunctivae normal.  Cardiovascular:     Rate and Rhythm: Normal rate and regular rhythm.     Heart sounds: Normal heart sounds. No murmur heard.   No friction rub. No gallop.  Pulmonary:     Effort: Pulmonary effort is normal. Tachypnea present. No respiratory distress.     Breath sounds: Normal breath sounds. No stridor. No wheezing, rhonchi or rales.  Musculoskeletal:        General: Normal range of motion.     Cervical back: Normal range of motion.  Skin:    General: Skin is warm and dry.     Capillary Refill: Capillary refill takes less than 2 seconds.  Neurological:     General: No focal deficit present.     Mental Status: She is alert and oriented to person, place, and time. Mental status is at baseline.  Psychiatric:        Mood and Affect: Mood normal.        Behavior: Behavior normal.        Thought Content: Thought content normal.        Judgment: Judgment normal.

## 2022-01-20 NOTE — Patient Instructions (Signed)
Decrease Prozac to every other day x2 weeks, then stop.  Go ahead and stop the Amitiza.

## 2022-01-21 ENCOUNTER — Ambulatory Visit: Payer: BC Managed Care – PPO | Admitting: Pulmonary Disease

## 2022-01-21 ENCOUNTER — Other Ambulatory Visit (INDEPENDENT_AMBULATORY_CARE_PROVIDER_SITE_OTHER): Payer: BC Managed Care – PPO

## 2022-01-21 ENCOUNTER — Encounter: Payer: Self-pay | Admitting: Pulmonary Disease

## 2022-01-21 ENCOUNTER — Other Ambulatory Visit: Payer: Self-pay

## 2022-01-21 ENCOUNTER — Telehealth: Payer: Self-pay | Admitting: Pulmonary Disease

## 2022-01-21 VITALS — BP 142/88 | HR 99 | Ht 63.0 in | Wt 178.0 lb

## 2022-01-21 DIAGNOSIS — R0602 Shortness of breath: Secondary | ICD-10-CM | POA: Diagnosis not present

## 2022-01-21 LAB — COMPREHENSIVE METABOLIC PANEL
ALT: 19 U/L (ref 0–35)
AST: 12 U/L (ref 0–37)
Albumin: 4 g/dL (ref 3.5–5.2)
Alkaline Phosphatase: 60 U/L (ref 39–117)
BUN: 14 mg/dL (ref 6–23)
CO2: 20 mEq/L (ref 19–32)
Calcium: 9.6 mg/dL (ref 8.4–10.5)
Chloride: 108 mEq/L (ref 96–112)
Creatinine, Ser: 1.01 mg/dL (ref 0.40–1.20)
GFR: 69.24 mL/min (ref >60.00)
Glucose, Bld: 127 mg/dL — ABNORMAL HIGH (ref 70–99)
Potassium: 3.9 mEq/L (ref 3.5–5.1)
Sodium: 138 mEq/L (ref 135–145)
Total Bilirubin: 0.4 mg/dL (ref 0.2–1.2)
Total Protein: 7 g/dL (ref 6.0–8.3)

## 2022-01-21 LAB — TSH: TSH: 1.4 u[IU]/mL (ref 0.35–5.50)

## 2022-01-21 MED ORDER — ADVAIR HFA 230-21 MCG/ACT IN AERO: 2.0000 | INHALATION_SPRAY | Freq: Two times a day (BID) | RESPIRATORY_TRACT | 12 refills | Status: DC

## 2022-01-21 NOTE — Telephone Encounter (Signed)
Rx has been corrected.  Aaron Edelman with the drug store is aware and voiced his understanding.  Nothing further needed.

## 2022-01-21 NOTE — Progress Notes (Signed)
Synopsis: Referred in February 2023 for Shortness of Breath  Subjective:   PATIENT ID: Allison Galloway GENDER: female DOB: 1981-10-22, MRN: [id number]  HPI  Chief Complaint  Patient presents with   Consult    Self referral for SOB for the past 3 weeks. States she did have bronchitis when this started. Has been to 4 different hospitals for the same reason.    Allison Galloway is a 41 year old woman, former smoker who is referred to pulmonary clinic for shortness of breath.   She went to [facility] ER on 01/17/2022 for chest pain.  He reports being treated with bronchitis 3 weeks ago and completed a course of prednisone along with azithromycin.  She is having persistent shortness of breath with exertion.  She has been using albuterol nebulizer treatments without much improvement.  CT chest with contrast on 01/17/2022 is without hilar or mediastinal adenopathy.  No dominant or suspicious nodules.  No focal consolidation, infiltrate or effusions present.  No pericardial effusion present.  CT of the neck showed no lymphadenopathy and no mass along her upper airway.  There is air-fluid level of the left maxillary sinus.  She had a serum bicarb level of 20 on 01/07/2022 ER visit.  EKG 01/08/2022 showed normal sinus rhythm.  The shortness of breath overall began at the beginning of the month fairly abruptly.  She reports some wheezing when active with the shortness of breath.  She has intermittent cough that is dry.  She has rare heartburn.  She denies any fevers chills or sweats.  She has not had shortness of breath like this in the past.  She denies seasonal allergies.  She has pet dogs and cats and has never had issues with allergies to them in the past.  She was previously smoking cigarettes daily and then switched over to vaping.  She has not smoked or vaped in the past month due to the shortness of breath.  She currently works at [employer] and denies any dust or chemical exposures.   She has a headache she  has history of anxiety and depression.  She had previously stopped any medications for treatment of these conditions and had been doing okay managing on her own.  She was recently started on fluoxetine and is taking this every other day at this time.  She was recently started on lubiprostone for issues of constipation.  She started this medication around the time her shortness of breath also started and is concerned her breathing is being affected by the new medication.  She denies any specific stressors or trauma in her life that could be leading to increased anxiety.  Past Medical History:  Diagnosis Date   Anxiety    Constipation    Depression      Family History  Problem Relation Age of Onset   Diabetes Mother    Depression Mother    Arthritis Mother    Asthma Mother    Learning disabilities Mother    Mental illness Mother    Depression Sister    Asthma Sister    Mental illness Sister    Seizures Daughter    Hypertension Other    Bipolar disorder Other    Anxiety disorder Other    Depression Other      Social History   Socioeconomic History   Marital status: Single    Spouse name: Not on file   Number of children: Not on file   Years of education: Not on file  Highest education level: Not on file  Occupational History   Not on file  Tobacco Use   Smoking status: Former    Packs/day: 0.25    Types: Cigarettes    Quit date: 08/2021    Years since quitting: 0.3   Smokeless tobacco: Never  Vaping Use   Vaping Use: Never used  Substance and Sexual Activity   Alcohol use: No   Drug use: No   Sexual activity: Yes    Birth control/protection: None  Other Topics Concern   Not on file  Social History Narrative   Not on file   Social Determinants of Health   Financial Resource Strain: Not on file  Food Insecurity: Not on file  Transportation Needs: Not on file  Physical Activity: Not on file  Stress: Not on file  Social Connections: Not on file  Intimate  Partner Violence: Not on file     No Known Allergies   Outpatient Medications Prior to Visit  Medication Sig Dispense Refill   albuterol (VENTOLIN HFA) 108 (90 Base) MCG/ACT inhaler Inhale 1-2 puffs into the lungs every 6 (six) hours as needed for wheezing or shortness of breath.     FLUoxetine (PROZAC) 40 MG capsule Take 40 mg by mouth every other day.     ipratropium-albuterol (DUONEB) 0.5-2.5 (3) MG/3ML SOLN Take by nebulization.     No facility-administered medications prior to visit.   Review of Systems  Constitutional:  Negative for chills, fever, malaise/fatigue and weight loss.  HENT:  Negative for congestion, sinus pain and sore throat.   Eyes: Negative.   Respiratory:  Positive for shortness of breath. Negative for cough, hemoptysis, sputum production and wheezing.   Cardiovascular:  Positive for chest pain. Negative for palpitations, orthopnea, claudication and leg swelling.  Gastrointestinal:  Negative for abdominal pain, heartburn, nausea and vomiting.  Genitourinary: Negative.   Musculoskeletal:  Negative for joint pain and myalgias.  Skin:  Negative for rash.  Neurological:  Negative for weakness.  Endo/Heme/Allergies: Negative.   Psychiatric/Behavioral:  Positive for depression. The patient is nervous/anxious.    Objective:   Vitals:   01/21/22 0900  BP: (!) 142/88  Pulse: 99  SpO2: 100%  Weight: 178 lb (80.7 kg)  Height: 5' 3" (1.6 m)   Physical Exam Constitutional:      General: She is not in acute distress.    Appearance: She is not ill-appearing.  HENT:     Head: Normocephalic and atraumatic.     Mouth/Throat:     Mouth: Mucous membranes are moist.     Palate: No mass and lesions.     Pharynx: Oropharynx is clear. No pharyngeal swelling or posterior oropharyngeal erythema.     Tonsils: No tonsillar exudate.     Comments: No stridor Eyes:     General: No scleral icterus.    Conjunctiva/sclera: Conjunctivae normal.     Pupils: Pupils are equal,  round, and reactive to light.  Cardiovascular:     Rate and Rhythm: Normal rate and regular rhythm.     Pulses: Normal pulses.     Heart sounds: Normal heart sounds. No murmur heard. Pulmonary:     Effort: Pulmonary effort is normal.     Breath sounds: Normal breath sounds. No wheezing, rhonchi or rales.     Comments: Erratic Breathing Pattern Abdominal:     General: Bowel sounds are normal.     Palpations: Abdomen is soft.  Musculoskeletal:     Right lower leg: No edema.  Left lower leg: No edema.  Lymphadenopathy:     Cervical: No cervical adenopathy.  Skin:    General: Skin is warm and dry.  Neurological:     General: No focal deficit present.     Mental Status: She is alert.  Psychiatric:        Mood and Affect: Mood normal.        Behavior: Behavior normal.        Thought Content: Thought content normal.        Judgment: Judgment normal.   CBC    Component Value Date/Time   WBC 8.7 01/07/2022 0801   RBC 4.77 01/07/2022 0801   HGB 13.1 01/07/2022 0801   HCT 41.0 01/07/2022 0801   PLT 243 01/07/2022 0801   MCV 86.0 01/07/2022 0801   MCH 27.5 01/07/2022 0801   MCHC 32.0 01/07/2022 0801   RDW 13.2 01/07/2022 0801   LYMPHSABS 1.1 01/07/2022 0801   MONOABS 0.2 01/07/2022 0801   EOSABS 0.0 01/07/2022 0801   BASOSABS 0.0 01/07/2022 0801   BMP Latest Ref Rng & Units 01/07/2022 07/11/2020 07/29/2019  Glucose 70 - 99 mg/dL 168(H) 83 94  BUN 6 - 20 mg/dL _0 Creatinine 0.44 - 1.00 mg/dL 0.88 0.86 0.80  Sodium 135 - 145 mmol/L 137 141 140  Potassium 3.5 - 5.1 mmol/L 3.5 3.6 4.0  Chloride 98 - 111 mmol/L 109 108 105  CO2 22 - 32 mmol/L 20(L) 24 -  Calcium 8.9 - 10.3 mg/dL 8.8(L) 9.2 -   Chest imaging: CT Chest 01/17/22 Limited examination due to motion artifact. No acute findings or focal disease in the chest.   CXR 01/07/22 Cardiac and mediastinal contours within normal limits. Mild bibasilar atelectasis. No focal consolidation. No large pleural effusion or  evidence of pneumothorax.  PFT: No flowsheet data found. Spirometry 01/21/22:  FVC 2.7L (77%) FEV1 2.3L (79%) FEV1/FVC 83  Labs:  Path:  Echo:  Heart Catheterization:  Assessment & Plan:   Shortness of breath - Plan: Ambulatory referral to ENT, Comp Met (CMET), TSH, Spirometry with graph, fluticasone-salmeterol (ADVAIR HFA) 230-21 MCG/ACT inhaler, DISCONTINUED: fluticasone-salmeterol (ADVAIR HFA) 230-21 MCG/ACT inhaler  Discussion: Allison Galloway is a 41 year old woman, former smoker who is referred to pulmonary clinic for shortness of breath.   The etiology of her shortness of breath is unknown at this time.  Differential includes anxiety versus side effect of lubiprostone vs vocal cord dysfunction vs reactive airways disease vs renal tubular acidosis.  The onset of her shortness of breath is acute and within the time frame of therapy lubiprostone.  This medication has been discontinued 2 days ago.  She had low serum bicarb on labs from the emergency room on 01/07/2022.    We will repeat chemistry panel today.  We will also check TSH.  I have sent in Advair 230-21 mcg 2 puffs twice daily for possible reactive airways disease.  We will refer her to ENT for laryngoscopy and evaluation of vocal cord dysfunction.  Spirometry today is suggestive of restriction.  CT chest scan from 2/19 along with CT neck 2/19 are unremarkable for airway, lung parenchyma or upper airway abnormalities.  She is to continue taking Prozac in case her shortness of breath is related to anxiety/panic attacks.  Follow-up in 4 weeks.  Freda Jackson, MD [facility] Office: [phone]   Current Outpatient Medications:    albuterol (VENTOLIN HFA) 108 (90 Base) MCG/ACT inhaler, Inhale 1-2 puffs into the lungs  every 6 (six) hours as needed for wheezing or shortness of breath., Disp: , Rfl:    FLUoxetine (PROZAC) 40 MG capsule, Take 40 mg by mouth every other day., Disp: , Rfl:     ipratropium-albuterol (DUONEB) 0.5-2.5 (3) MG/3ML SOLN, Take by nebulization., Disp: , Rfl:    fluticasone-salmeterol (ADVAIR HFA) 230-21 MCG/ACT inhaler, Inhale 2 puffs into the lungs 2 (two) times daily., Disp: 12 g, Rfl: 12

## 2022-01-21 NOTE — Patient Instructions (Addendum)
Start advair HFA inhaler 2 puffs twice daily - rinse mouth out after each use  Continue to use albuterol inhaler as needed  We will check spirometry today  We will repeat chemistry panel today to monitor the serum bicarb level

## 2022-01-25 ENCOUNTER — Encounter: Payer: Self-pay | Admitting: Family Medicine

## 2022-01-25 ENCOUNTER — Telehealth: Payer: Self-pay | Admitting: Family Medicine

## 2022-01-25 ENCOUNTER — Telehealth: Payer: Self-pay | Admitting: Pulmonary Disease

## 2022-01-25 NOTE — Telephone Encounter (Signed)
Pt called to see if PCP has signed her Short Term Disability Forms that were dropped off on 01/20/2022.  Pt says she needs these forms signed and sent in ASAP before deadline.   Please advise and call pt with update.

## 2022-01-26 NOTE — Telephone Encounter (Signed)
Patient called back and states first date out of work was 01/04/2022. Forms updated and sent back to Dr. Erin Fulling for signature.

## 2022-01-26 NOTE — Telephone Encounter (Signed)
It is okay.  We will get it finished.

## 2022-01-26 NOTE — Telephone Encounter (Signed)
Left voicemail for patient to call back in regards to her disability paperwork.   If patient calls back- I need to know the first date she was out of work/ unable to work.

## 2022-01-26 NOTE — Telephone Encounter (Signed)
Patient states that she got a letter from [employer] and stated she needed it from both speciality and PCP.  Patient is going to bring the paper work by to have [place] look at.

## 2022-01-26 NOTE — Telephone Encounter (Signed)
I was off yesterday. Is she still in need of this? It looks like pulmonology has taken care of this. She shouldn't need it from both of [place] and they would be more appropriate since they are treating.

## 2022-01-27 NOTE — Telephone Encounter (Signed)
Papers in process on providers desk ?

## 2022-01-27 NOTE — Telephone Encounter (Signed)
Forms faxed to disability company at [phone]. Copy placed in scan. Nothing further needed.  ?

## 2022-01-28 ENCOUNTER — Telehealth: Payer: Self-pay | Admitting: Family Medicine

## 2022-01-28 NOTE — Telephone Encounter (Signed)
Patient called requesting to speak with referrals to get update on status of referral to see ENT. ?

## 2022-01-29 NOTE — Telephone Encounter (Signed)
It is noted in the pulmonologist's note that they were referring to ENT. I have nothing to do with this referral. She will need to contact them.  ?

## 2022-01-29 NOTE — Telephone Encounter (Signed)
Patient aware and verbalizes understanding. 

## 2022-01-30 ENCOUNTER — Encounter (HOSPITAL_COMMUNITY): Payer: Self-pay

## 2022-01-30 ENCOUNTER — Emergency Department (HOSPITAL_COMMUNITY): Payer: BC Managed Care – PPO

## 2022-01-30 ENCOUNTER — Emergency Department (HOSPITAL_COMMUNITY)
Admission: EM | Admit: 2022-01-30 | Discharge: 2022-01-30 | Disposition: A | Discharge status: Home / Self Care | Payer: BC Managed Care – PPO | Attending: Emergency Medicine | Admitting: Emergency Medicine

## 2022-01-30 ENCOUNTER — Other Ambulatory Visit: Payer: Self-pay

## 2022-01-30 DIAGNOSIS — R479 Unspecified speech disturbances: Secondary | ICD-10-CM | POA: Diagnosis not present

## 2022-01-30 DIAGNOSIS — R9431 Abnormal electrocardiogram [ECG] [EKG]: Secondary | ICD-10-CM | POA: Diagnosis not present

## 2022-01-30 DIAGNOSIS — R200 Anesthesia of skin: Secondary | ICD-10-CM | POA: Diagnosis not present

## 2022-01-30 DIAGNOSIS — R29810 Facial weakness: Secondary | ICD-10-CM | POA: Diagnosis not present

## 2022-01-30 DIAGNOSIS — J3489 Other specified disorders of nose and nasal sinuses: Secondary | ICD-10-CM | POA: Diagnosis not present

## 2022-01-30 DIAGNOSIS — R079 Chest pain, unspecified: Secondary | ICD-10-CM | POA: Insufficient documentation

## 2022-01-30 DIAGNOSIS — D329 Benign neoplasm of meninges, unspecified: Secondary | ICD-10-CM

## 2022-01-30 DIAGNOSIS — R0602 Shortness of breath: Secondary | ICD-10-CM | POA: Diagnosis not present

## 2022-01-30 LAB — COMPREHENSIVE METABOLIC PANEL
ALT: 28 U/L (ref 0–44)
AST: 22 U/L (ref 15–41)
Albumin: 3.5 g/dL (ref 3.5–5.0)
Alkaline Phosphatase: 48 U/L (ref 38–126)
Anion gap: 10 (ref 5–15)
BUN: 9 mg/dL (ref 6–20)
CO2: 20 mmol/L — ABNORMAL LOW (ref 22–32)
Calcium: 9.1 mg/dL (ref 8.9–10.3)
Chloride: 106 mmol/L (ref 98–111)
Creatinine, Ser: 0.95 mg/dL (ref 0.44–1.00)
GFR, Estimated: >60 mL/min (ref >60)
Glucose, Bld: 155 mg/dL — ABNORMAL HIGH (ref 70–99)
Potassium: 3.8 mmol/L (ref 3.5–5.1)
Sodium: 136 mmol/L (ref 135–145)
Total Bilirubin: 0.5 mg/dL (ref 0.3–1.2)
Total Protein: 6.3 g/dL — ABNORMAL LOW (ref 6.5–8.1)

## 2022-01-30 LAB — CBC
HCT: 39.5 % (ref 36.0–46.0)
Hemoglobin: 13.3 g/dL (ref 12.0–15.0)
MCH: 28.4 pg (ref 26.0–34.0)
MCHC: 33.7 g/dL (ref 30.0–36.0)
MCV: 84.4 fL (ref 80.0–100.0)
Platelets: 200 10*3/uL (ref 150–400)
RBC: 4.68 MIL/uL (ref 3.87–5.11)
RDW: 12.8 % (ref 11.5–15.5)
WBC: 7.3 10*3/uL (ref 4.0–10.5)
nRBC: 0 % (ref 0.0–0.2)

## 2022-01-30 LAB — DIFFERENTIAL
Abs Immature Granulocytes: 0.02 10*3/uL (ref 0.00–0.07)
Basophils Absolute: 0 10*3/uL (ref 0.0–0.1)
Basophils Relative: 0 %
Eosinophils Absolute: 0.1 10*3/uL (ref 0.0–0.5)
Eosinophils Relative: 2 %
Immature Granulocytes: 0 %
Lymphocytes Relative: 31 %
Lymphs Abs: 2.3 10*3/uL (ref 0.7–4.0)
Monocytes Absolute: 0.4 10*3/uL (ref 0.1–1.0)
Monocytes Relative: 5 %
Neutro Abs: 4.5 10*3/uL (ref 1.7–7.7)
Neutrophils Relative %: 62 %

## 2022-01-30 LAB — CBG MONITORING, ED: Glucose-Capillary: 153 mg/dL — ABNORMAL HIGH (ref 70–99)

## 2022-01-30 LAB — ETHANOL: Alcohol, Ethyl (B): <10 mg/dL (ref <10)

## 2022-01-30 LAB — I-STAT BETA HCG BLOOD, ED (MC, WL, AP ONLY): I-stat hCG, quantitative: <5 m[IU]/mL (ref <5)

## 2022-01-30 LAB — PROTIME-INR
INR: 1 (ref 0.8–1.2)
Prothrombin Time: 13.2 seconds (ref 11.4–15.2)

## 2022-01-30 LAB — APTT: aPTT: 26 seconds (ref 24–36)

## 2022-01-30 IMAGING — MR MR HEAD W/O CM
12 of 13 series · 44 of 48 positions shown · non-contrast
Comparison: Head CT [DATE].

CLINICAL DATA: Provided history: Neuro deficit, acute, stroke
suspected.

EXAM:
MRI HEAD WITHOUT CONTRAST
TECHNIQUE: Multiplanar, multiecho pulse sequences of the brain and surrounding
structures were obtained without intravenous contrast.

[Series 5: DWI · axial · 3.0mm · 0.88mm/px · z∈[-77,+71]mm · 8 of 104 slices shown (1 of 4)]
[im 1/104]
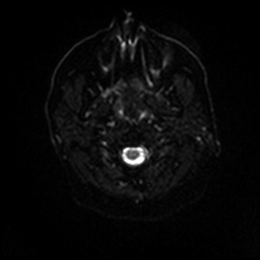
[im 15/104]
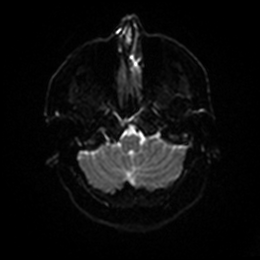
[im 30/104]
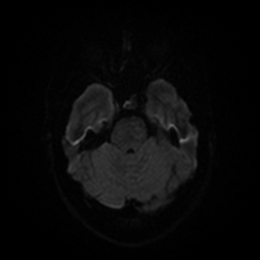
[im 45/104]
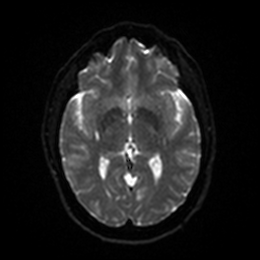
[im 59/104]
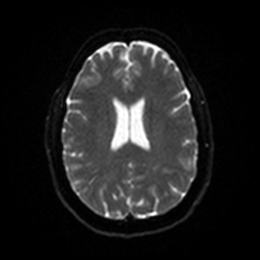
[im 74/104]
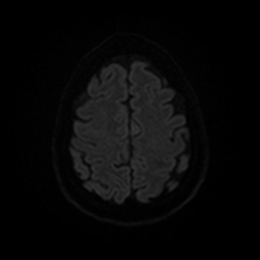
[im 89/104]
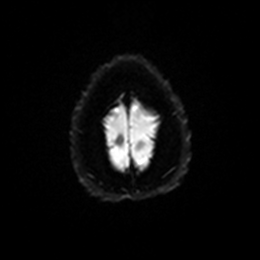
[im 104/104]
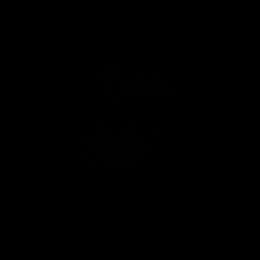

[Series 6: DWI · axial · 3.0mm · 0.88mm/px · z∈[-77,+68]mm · 4 of 51 slices shown (2 of 4)]
[im 1/51]
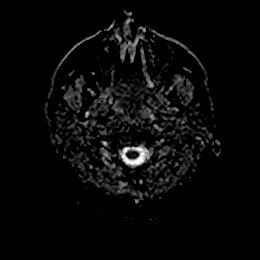
[im 17/51]
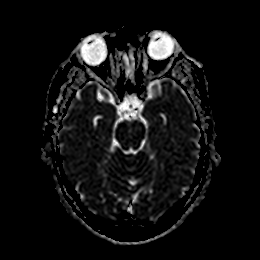
[im 34/51]
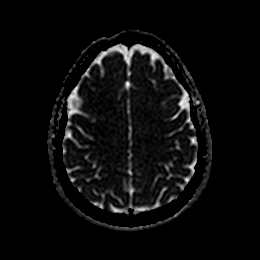
[im 51/51]
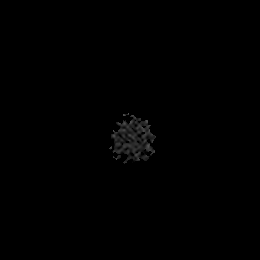

[Series 7: DWI · coronal · 4.0mm · 0.88mm/px · 6 of 74 slices shown (3 of 4)]
[im 1/74]
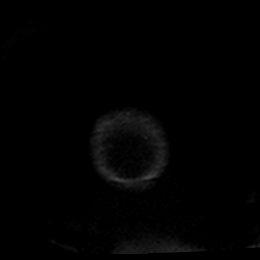
[im 15/74]
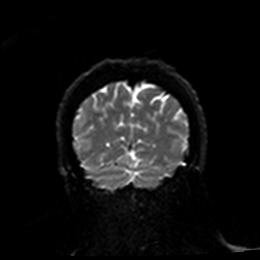
[im 30/74]
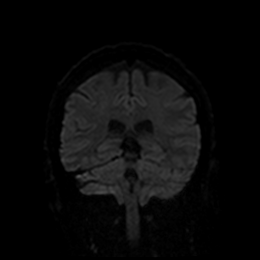
[im 44/74]
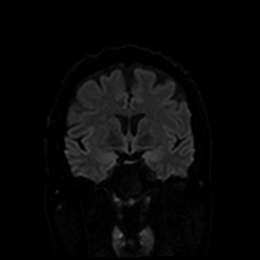
[im 59/74]
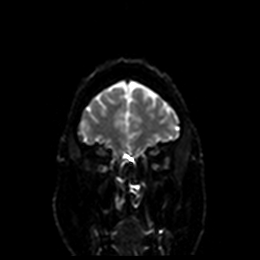
[im 74/74]
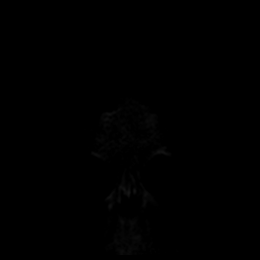

[Series 8: DWI · coronal · 4.0mm · 0.88mm/px · 3 of 37 slices shown (4 of 4)]
[im 1/37]
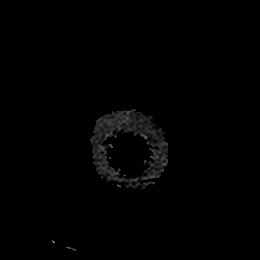
[im 19/37]
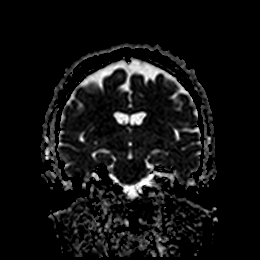
[im 37/37]
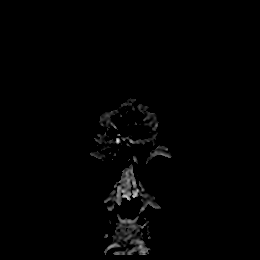

[Series 9: T1 · sagittal · 5.0mm · 0.75mm/px · 2 of 23 slices shown]
[im 1/23]
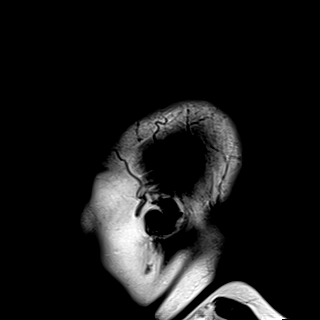
[im 23/23]
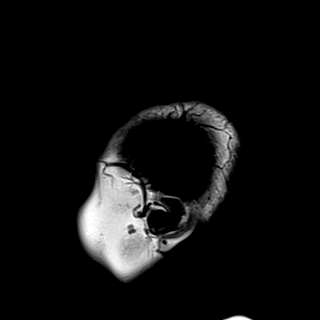

[Series 10: T2 · axial · 5.0mm · 0.72mm/px · z∈[-75,+70]mm · 2 of 26 slices shown (1 of 2)]
[im 1/26]
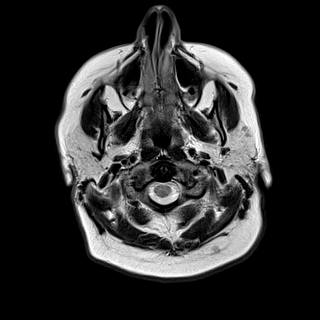
[im 26/26]
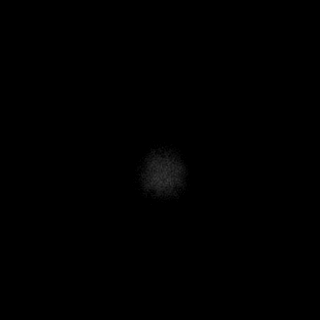

[Series 11: FLAIR · axial · 5.0mm · 0.45mm/px · z∈[-79,+66]mm · 2 of 26 slices shown]
[im 1/26]
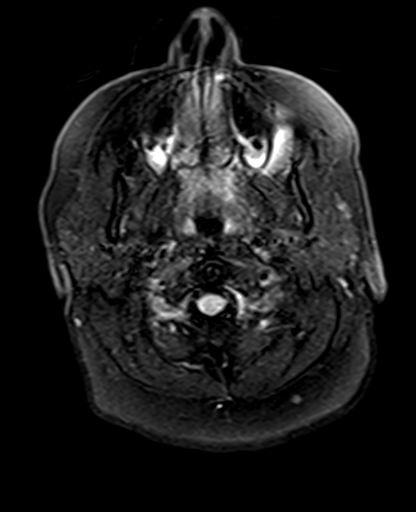
[im 26/26]
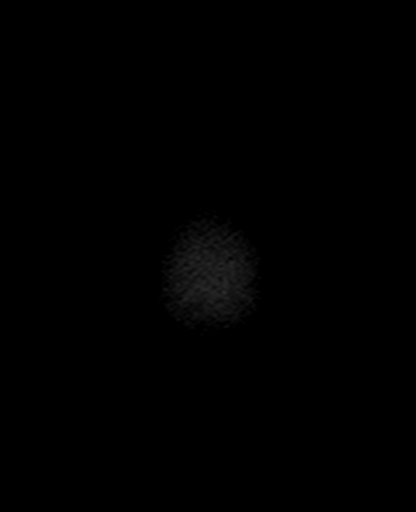

[Series 12: mag_images · axial · 3.0mm · 0.90mm/px · z∈[-80,+67]mm · 4 of 52 slices shown]
[im 1/52]
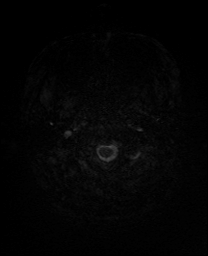
[im 18/52]
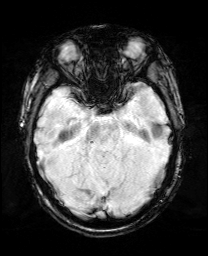
[im 35/52]
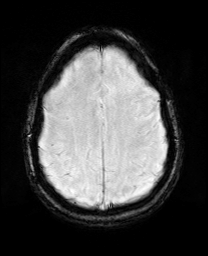
[im 52/52]
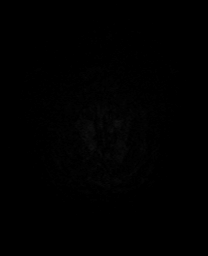

[Series 13: pha_images · axial · 3.0mm · 0.90mm/px · z∈[-80,+62]mm · 4 of 50 slices shown]
[im 1/50]
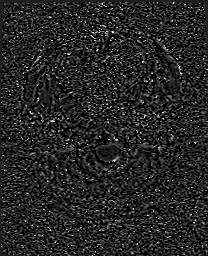
[im 17/50]
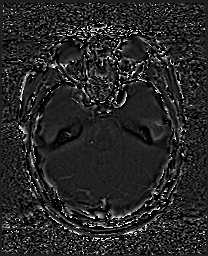
[im 33/50]
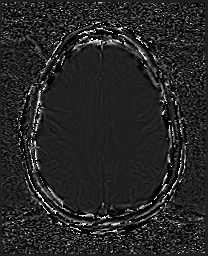
[im 50/50]
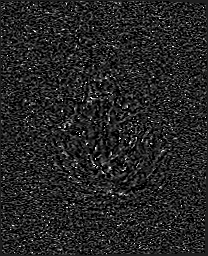

[Series 14: swi_images · axial · 3.0mm · 0.90mm/px · z∈[-80,+67]mm · 4 of 52 slices shown]
[im 1/52]
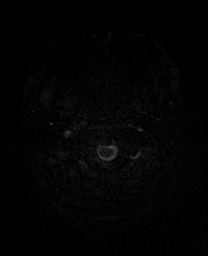
[im 18/52]
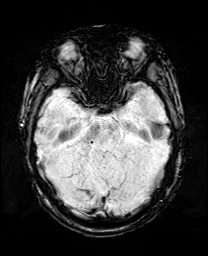
[im 35/52]
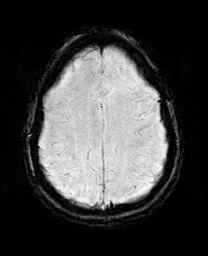
[im 52/52]
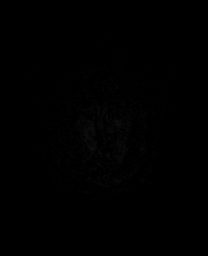

[Series 15: mip_images(sw) · axial · 24.0mm · 0.90mm/px · z∈[-70,+57]mm · 3 of 45 slices shown]
[im 1/45]
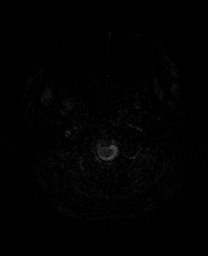
[im 23/45]
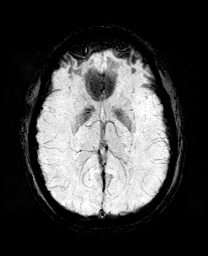
[im 45/45]
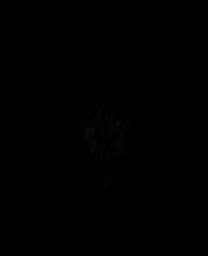

[Series 17: T2 · coronal · 5.0mm · 0.34mm/px · 2 of 31 slices shown (2 of 2)]
[im 1/31]
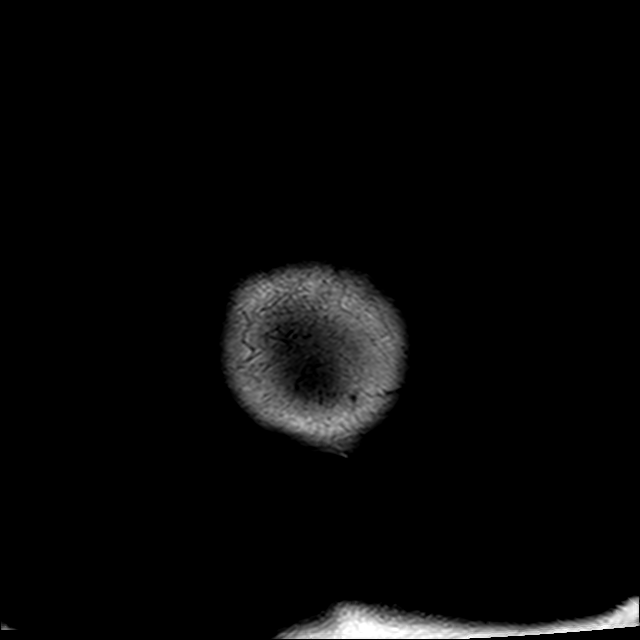
[im 31/31]
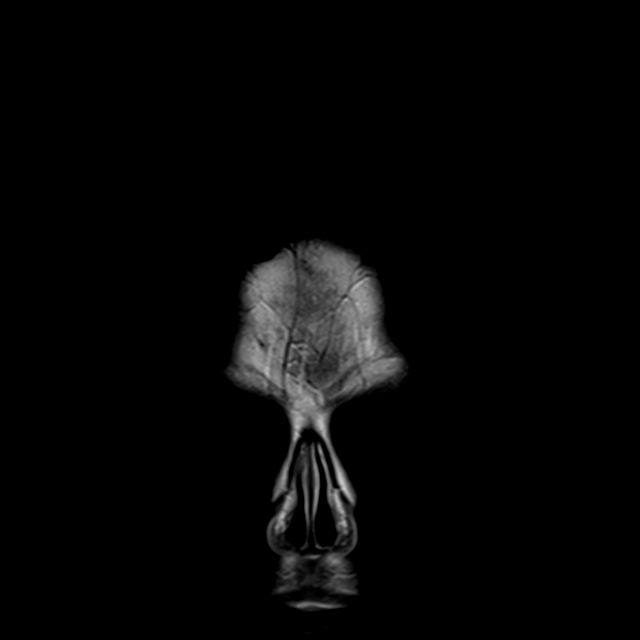

[44 of 48 positions shown; findings below may reference images not displayed]

FINDINGS: Mild intermittent motion degradation.

Brain:

Cerebral volume is normal.

No cortical encephalomalacia is identified. No significant cerebral
white matter disease.

6 mm extra-axial dural-based mass overlying the mid to posterior
right frontal lobe likely reflecting a small meningioma (series 11,
image 22) (series 17, image 22). No significant mass effect upon the
underlying brain parenchyma. No underlying parenchymal edema.

There is no acute infarct.

No chronic intracranial blood products.

No extra-axial fluid collection.

No midline shift.

Vascular: Maintained flow voids within the proximal large arterial
vessels.

Skull and upper cervical spine: No focal suspicious marrow lesion.

Sinuses/Orbits: Visualized orbits show no acute finding. Mild
mucosal thickening and fluid within the bilateral ethmoid sinuses.
Trace mucosal thickening within the left maxillary sinus.

Other: Bilateral mastoid effusions.
IMPRESSION: No evidence of acute or recent subacute infarction.

6 mm extra-axial dural-based mass overlying the mid-to-posterior
right frontal lobe, likely reflecting a small meningioma. There is
no significant mass effect upon the underlying brain parenchyma, and
no underlying parenchymal edema.

Unremarkable non-contrast MRI appearance of the brain itself.

No evidence of a Chiari I malformation (as was questioned on the
head CT performed earlier today).

Mild paranasal sinus disease, as described.

Bilateral mastoid effusions.

## 2022-01-30 IMAGING — CT CT HEAD W/O CM
4 series · 16 of 47 positions shown, 18 images · non-contrast
Comparison: None.

CLINICAL DATA: Facial numbness, difficulty speaking



[Series 3: head without · axial · non-contrast · 0.41mm/px · z∈[-103,+12]mm · 7 of 31 slices shown, 9 images]
[im 4/31  brain]
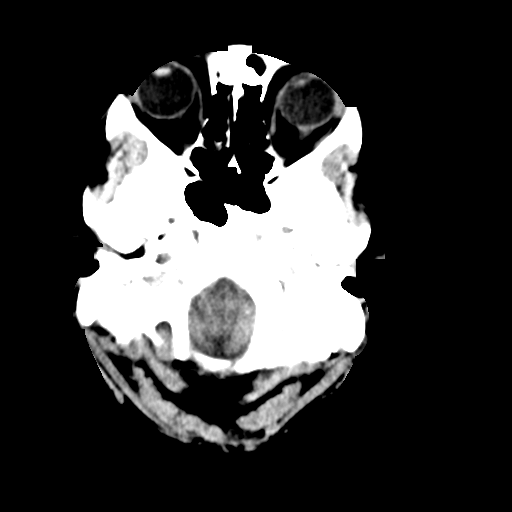
[im 4/31  bone]
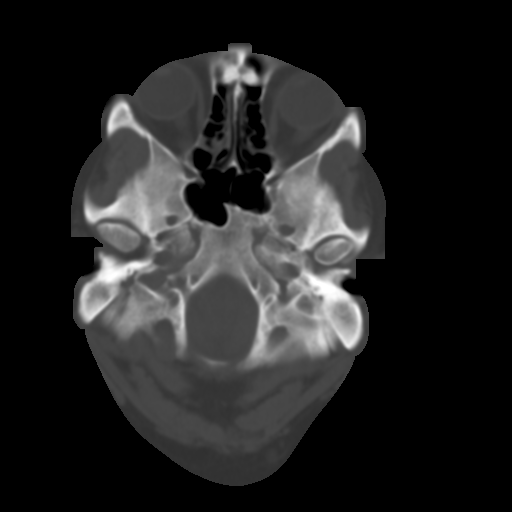
[im 8/31  brain]
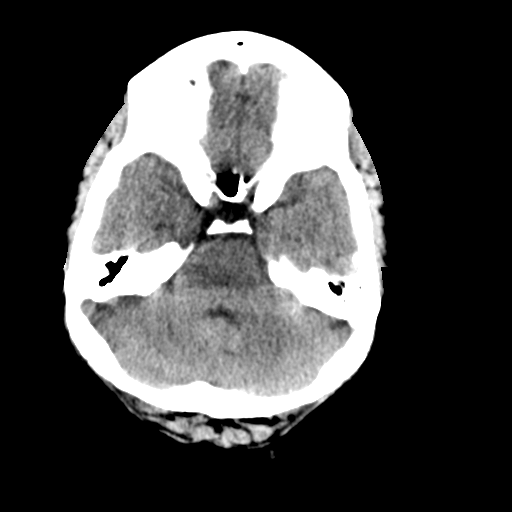
[im 12/31  brain]
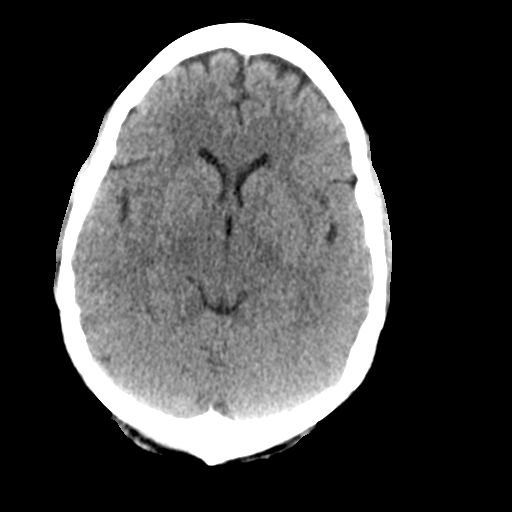
[im 16/31  brain]
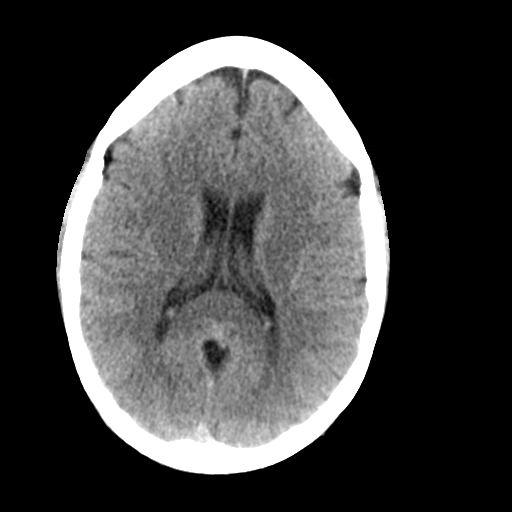
[im 19/31  brain]
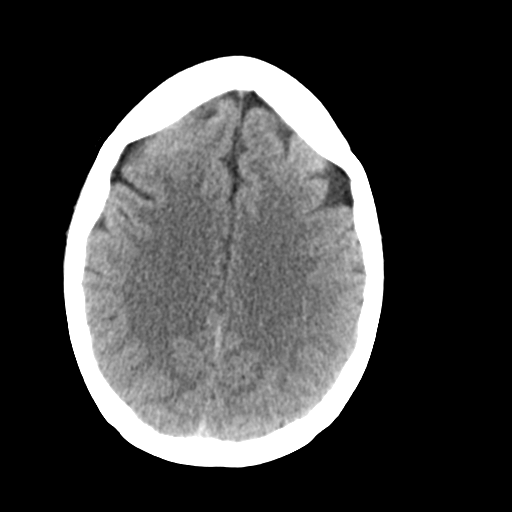
[im 19/31  bone]
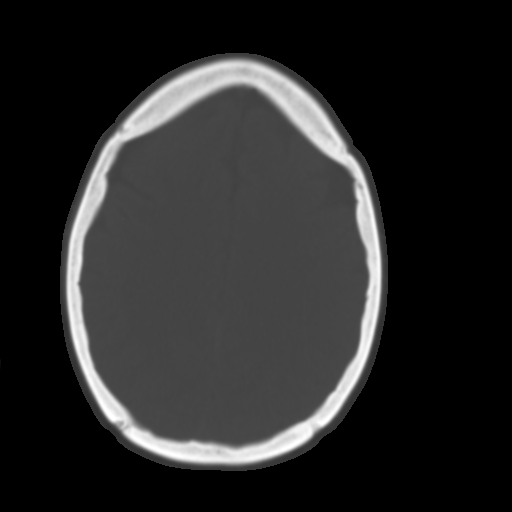
[im 23/31  brain]
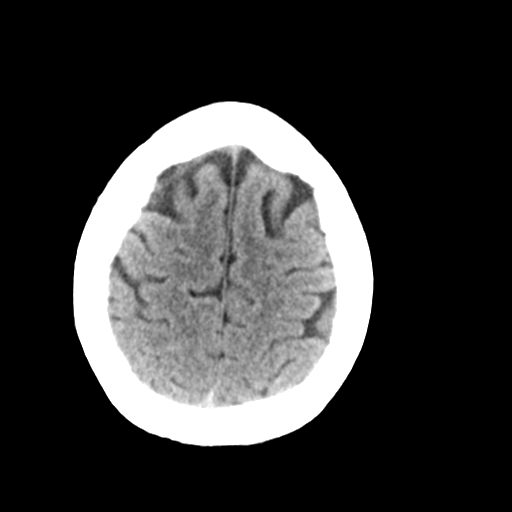
[im 27/31  brain]
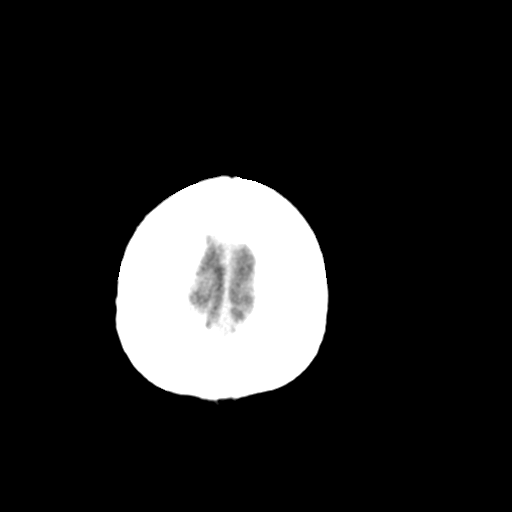

[Series 4: head bone · axial · 0.41mm/px · z∈[-105,-75]mm · 3 of 76 slices shown]
[im 8/76  bone]
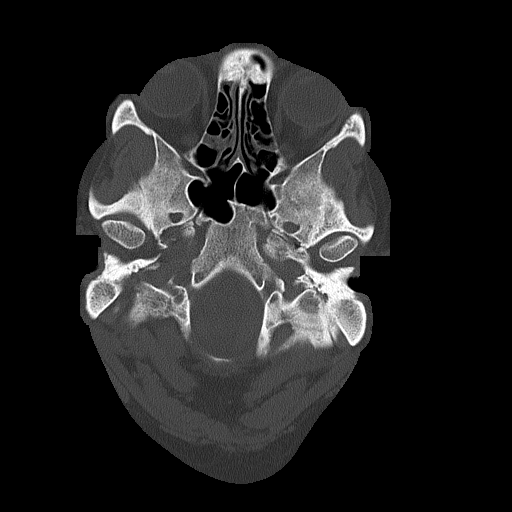
[im 16/76  bone]
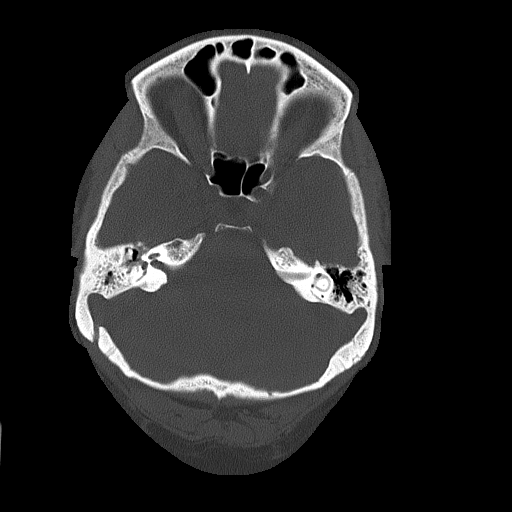
[im 23/76  bone]
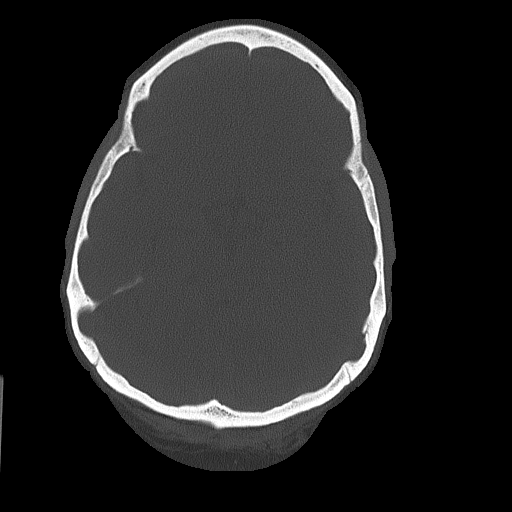

[Series 5: head without cor · coronal · non-contrast · 0.31mm/px · 3 of 61 slices shown]
[im 21/61  brain]
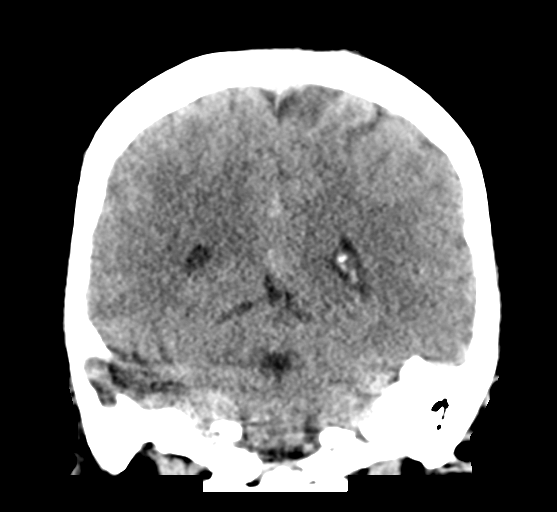
[im 27/61  brain]
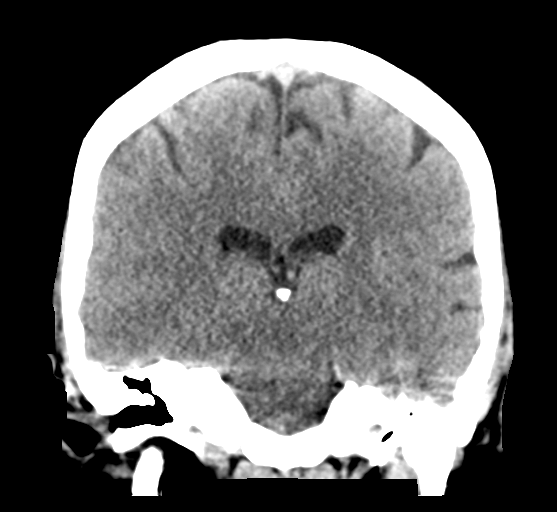
[im 34/61  brain]
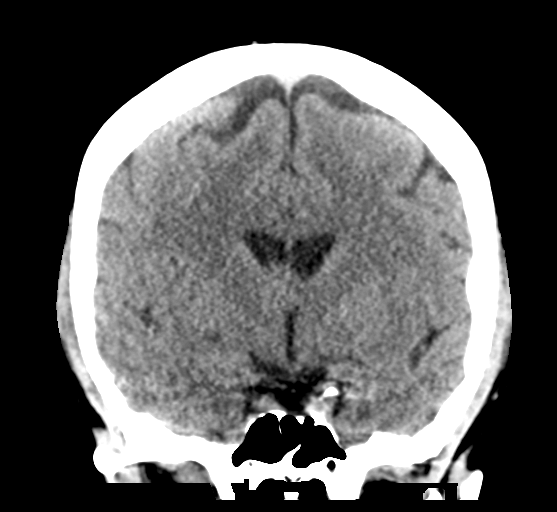

[Series 6: head without sag · sagittal · non-contrast · 0.30mm/px · 3 of 47 slices shown]
[im 16/47  brain]
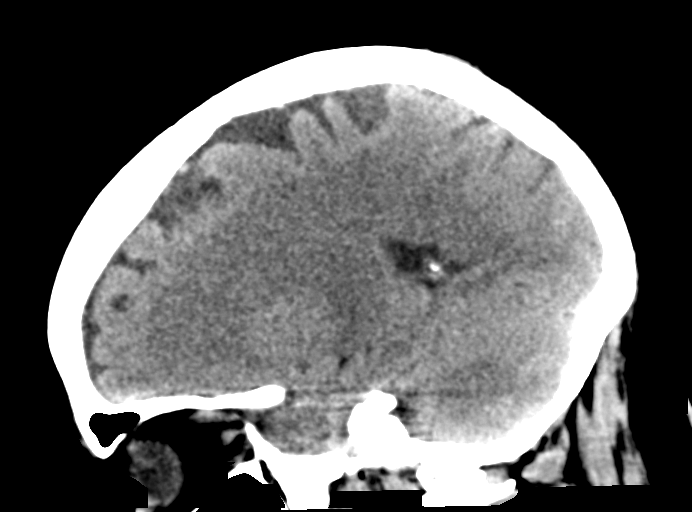
[im 24/47  brain]
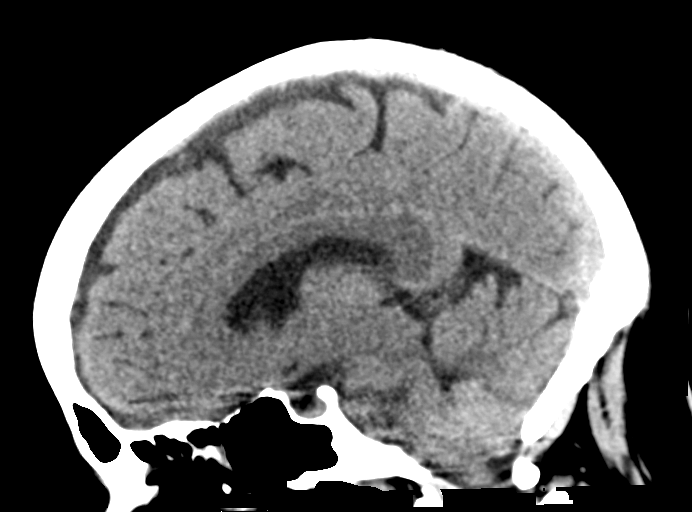
[im 31/47  brain]
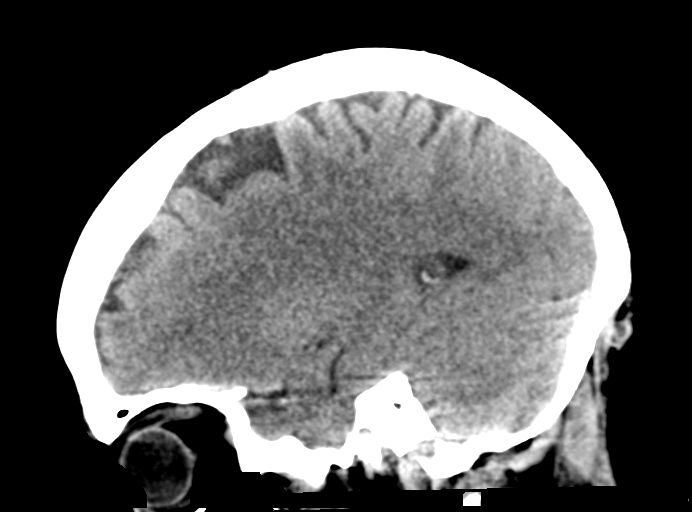

[16 of 47 positions shown; findings below may reference images not displayed]

FINDINGS: Brain: No acute intracranial findings are seen in noncontrast CT
brain. Cortical sulci are prominent. Ventricles are not dilated.
There is no shift of midline structures. Cerebellar tonsils are
lower than usual in the position suggesting possible Chiari 1
malformation.

Vascular: Unremarkable.

Skull: No fracture is seen.

Sinuses/Orbits: There is mild mucosal thickening in the ethmoid
sinus.

Other: There is increased amount of CSF insula suggesting partial
empty sella.
IMPRESSION: No acute intracranial findings are seen in noncontrast CT brain.

Cerebellar tonsils are lower than usual suggesting possible Chiari 1
malformation.

Mild chronic ethmoid sinusitis.

## 2022-01-30 MED ORDER — LORAZEPAM 2 MG/ML IJ SOLN
1.0000 mg | Freq: Once | INTRAMUSCULAR | Status: AC
Start: 2022-01-30 — End: 2022-01-30
  Administered 2022-01-30: 1 mg via INTRAVENOUS
  Filled 2022-01-30: qty 1

## 2022-01-30 NOTE — ED Triage Notes (Signed)
Patient complains of throat tightness, difficulty speaking, facial numbness since 2/6. Today increased numbness to face. Patient reports no difference in her speech abnormality today and no other complaints ?

## 2022-01-30 NOTE — Discharge Instructions (Addendum)
Below is the contact information for both [facility] as well as neurology.  Please call them and schedule follow-up appointments.  Please also follow-up with your regular provider.  If you develop any new or worsening symptoms please come back to the emergency department. ?

## 2022-01-30 NOTE — ED Provider Notes (Addendum)
Patient is a 41 year old female whose care was transferred to me at shift change from [place] PA-C.  Her HPI as below: ? ?Allison Galloway is a 41 y.o. female with a past medical history of depressio depression, anxiety and chronic constipation presenting today with complaint of strokelike symptoms.  She reports that she went to bed last night feeling normal and then she woke up this morning to some left-sided facial twitching.  She subsequently started to feel weak on her left side and said that it felt somewhat tingly.  No history of CVA, DM or hyperlipidemia.  Reports some history of elevated blood pressures not on any medications.  No visual disturbance.  Her most concerning symptom was that she was having difficulty speaking and was experiencing large amounts of stuttering. ?Physical Exam  ?BP 134/84   Pulse 64   Temp 98.7 ?F (37.1 ?C) (Oral)   Resp 19   Ht '5\' 3"'$  (1.6 m)   Wt 81.2 kg   SpO2 98%   BMI 31.71 kg/m?  ? ?Physical Exam ?Vitals and nursing note reviewed.  ?Constitutional:   ?   General: She is not in acute distress. ?   Appearance: She is well-developed.  ?HENT:  ?   Head: Normocephalic and atraumatic.  ?   Right Ear: External ear normal.  ?   Left Ear: External ear normal.  ?Eyes:  ?   General: No scleral icterus.    ?   Right eye: No discharge.     ?   Left eye: No discharge.  ?   Conjunctiva/sclera: Conjunctivae normal.  ?Neck:  ?   Trachea: No tracheal deviation.  ?Cardiovascular:  ?   Rate and Rhythm: Normal rate.  ?Pulmonary:  ?   Effort: Pulmonary effort is normal. No respiratory distress.  ?   Breath sounds: No stridor.  ?Abdominal:  ?   General: There is no distension.  ?Musculoskeletal:     ?   General: No swelling or deformity.  ?   Cervical back: Neck supple.  ?Skin: ?   General: Skin is warm and dry.  ?   Findings: No rash.  ?Neurological:  ?   Mental Status: She is alert and oriented to person, place, and time.  ?   Comments: A&O x3.  Mild facial droop noted at the left corner of the  mouth.  Extraocular movements intact.  Pupils equal, round, and reactive to light.  No involvement noted in the forehead or periorbital space.  Stuttering noted when speaking.  Moving all 4 extremities.  Strength appears to be 4/5 in the left arm and 5/5 in the right arm.  Grip strength intact and symmetric.  Strength is 5/5 in the bilateral lower extremities.  ? ?Procedures  ?Procedures ? ?ED Course / MDM  ?  ?Medical Decision Making ?Amount and/or Complexity of Data Reviewed ?Labs: ordered. ?Radiology: ordered. ? ?Risk ?Prescription drug management. ? ?Patient is a 41 year old female whose care was transferred me at shift change from [place] PA-C.  Please see her note below for additional information.  In summary, patient presents to the emergency department for difficulty speaking, decreased sensation on the left side and left-sided facial droop.  Patient discussed with Dr. Lorrin Goodell with neurology by the previous PA-C who recommended MRI of the brain. ? ?MRI of the brain without contrast has since resulted with findings as noted below: ? ? ?IMPRESSION:  ?No evidence of acute or recent subacute infarction.  ?   ?6 mm extra-axial dural-based mass  overlying the mid-to-posterior  ?right frontal lobe, likely reflecting a small meningioma. There is  ?no significant mass effect upon the underlying brain parenchyma, and  ?no underlying parenchymal edema.  ?   ?Unremarkable non-contrast MRI appearance of the brain itself.  ?   ?No evidence of a Chiari I malformation (as was questioned on the  ?head CT performed earlier today).  ?   ?Mild paranasal sinus disease, as described.  ?   ?Bilateral mastoid effusions.  ? ?Unsure the source of the patient's symptoms.  MRI negative for infarction.  There is a 6 mm extradural mass overlying the mid to posterior right frontal lobe which radiology believes is a small meningioma.  I discussed the patient once again with Dr. Lorrin Goodell who evaluated the patient's MRI.  Does not feel  that her meningioma is causing her symptoms.  Agrees with outpatient neurology and neurosurgery follow-up and discharge home. ? ?On my reassessment patient states that her symptoms have improved moderately since being given Ativan.  It is possible that a degree of anxiety could have a role in her presentation today.  Discussed findings with her mother, father, as well as her significant other at bedside.  They state that she has been evaluated in multiple emergency departments over the past month for various complaints including shortness of breath.  She has had multiple reassuring work-ups and was recently evaluated by pulmonology.  They state that she is also scheduled to see cardiology, ENT, as well as GI. ? ?Patient discussed with my attending physician Dr. Armandina Gemma who also evaluated the patient with the previous PA-C.  Agrees with neurology/neurosurgery and recommends discharge home.  This was discussed with the patient as well as her significant other at bedside who are agreeable.  Discussed return precautions.  Their questions were answered and they were amicable at the time of discharge. ? ? ? ? ?  ?Rayna Sexton, PA-C ?01/30/22 1905 ? ?  ?Rayna Sexton, PA-C ?01/30/22 1947 ? ?  ?Regan Lemming, MD ?01/30/22 2121 ? ?

## 2022-01-30 NOTE — ED Notes (Signed)
Pt verbalizes understanding of d/c instructions. Pt taken to vehicle via wheelchair at d/c with all belongings and with family.  ? ?

## 2022-01-30 NOTE — ED Notes (Signed)
Patient transported to MRI 

## 2022-01-30 NOTE — ED Provider Notes (Signed)
?[facility] ?Provider Note ? ? ?CSN: [id number] ?Arrival date & time: 01/30/22  1336 ? ?  ? ?History ?No chief complaint on file. ? ? ?Allison Galloway is a 41 y.o. female with a past medical history of depressio depression, anxiety and chronic constipation presenting today with complaint of strokelike symptoms.  She reports that she went to bed last night feeling normal and then she woke up this morning to some left-sided facial twitching.  She subsequently started to feel weak on her left side and said that it felt somewhat tingly.  No history of CVA, DM or hyperlipidemia.  Reports some history of elevated blood pressures not on any medications.  No visual disturbance.  Her most concerning symptom was that she was having difficulty speaking and was experiencing large amounts of stuttering. ? ? ?Home Medications ?Prior to Admission medications   ?Medication Sig Start Date End Date Taking? Authorizing Provider  ?albuterol (VENTOLIN HFA) 108 (90 Base) MCG/ACT inhaler Inhale 1-2 puffs into the lungs every 6 (six) hours as needed for wheezing or shortness of breath. 01/04/22 01/04/23 Yes [provider]  ?FLUoxetine (PROZAC) 40 MG capsule Take 40 mg by mouth every other day.   Yes [provider]  ?fluticasone-salmeterol (ADVAIR HFA) 230-21 MCG/ACT inhaler Inhale 2 puffs into the lungs 2 (two) times daily. 01/21/22  Yes Freddi Starr, MD  ?ipratropium-albuterol (DUONEB) 0.5-2.5 (3) MG/3ML SOLN Take 3 mLs by nebulization every 6 (six) hours as needed (sob/wheezing). 01/10/22  Yes [provider]  ?   ? ?Allergies    ?Patient has no known allergies.   ? ?Review of Systems   ?Review of Systems ? ?See HPI ? ?Physical Exam ?Updated Vital Signs ?BP (!) 159/95   Pulse 71   Resp 20   Ht '5\' 3"'$  (1.6 m)   Wt 81.2 kg   SpO2 98%   BMI 31.71 kg/m?  ? ?Physical Exam ?Vitals and nursing note reviewed.  ?Constitutional:   ?   Appearance: Normal appearance.  ?HENT:  ?    Head: Normocephalic and atraumatic.  ?Eyes:  ?   General: No scleral icterus. ?   Extraocular Movements: Extraocular movements intact.  ?   Conjunctiva/sclera: Conjunctivae normal.  ?   Pupils: Pupils are equal, round, and reactive to light.  ?Cardiovascular:  ?   Rate and Rhythm: Normal rate and regular rhythm.  ?Pulmonary:  ?   Effort: Pulmonary effort is normal. No respiratory distress.  ?   Breath sounds: No wheezing.  ?Skin: ?   Findings: No rash.  ?Neurological:  ?   Mental Status: She is alert.  ?   Sensory: Sensory deficit present.  ?   Coordination: Coordination abnormal.  ?   Comments: Patient with bilateral upper extremity tremor.  Difficulty with finger-nose testing bilaterally.  Left worse than right.  Reports that the left-sided sensation feels "light" when compared to the right.  4 out of 5 strength with finger grip on the left side.  Able to move all 4 extremities.  No noted pronator drift.  Cranial nerves II through XII intact.  EOMs intact ? ?  ?Psychiatric:     ?   Mood and Affect: Mood normal.  ? ? ?ED Results / Procedures / Treatments   ?Labs ?(all labs ordered are listed, but only abnormal results are displayed) ?Labs Reviewed  ?COMPREHENSIVE METABOLIC PANEL - Abnormal; Notable for the following components:  ?    Result Value  ? CO2 20 (*)   ?  Glucose, Bld 155 (*)   ? Total Protein 6.3 (*)   ? All other components within normal limits  ?CBG MONITORING, ED - Abnormal; Notable for the following components:  ? Glucose-Capillary 153 (*)   ? All other components within normal limits  ?ETHANOL  ?PROTIME-INR  ?APTT  ?CBC  ?DIFFERENTIAL  ?RAPID URINE DRUG SCREEN, HOSP PERFORMED  ?URINALYSIS, ROUTINE W REFLEX MICROSCOPIC  ?I-STAT BETA HCG BLOOD, ED ([facility], [facility], [facility] ONLY)  ? ? ?EKG ?None ? ?Radiology ?CT Head Wo Contrast ? ?Result Date: 01/30/2022 ?CLINICAL DATA:  Facial numbness, difficulty speaking EXAM: CT HEAD WITHOUT CONTRAST TECHNIQUE: Contiguous axial images were obtained from the base of the skull  through the vertex without intravenous contrast. RADIATION DOSE REDUCTION: This exam was performed according to the departmental dose-optimization program which includes automated exposure control, adjustment of the mA and/or kV according to patient size and/or use of iterative reconstruction technique. COMPARISON:  None. FINDINGS: Brain: No acute intracranial findings are seen in noncontrast CT brain. Cortical sulci are prominent. Ventricles are not dilated. There is no shift of midline structures. Cerebellar tonsils are lower than usual in the position suggesting possible Chiari 1 malformation. Vascular: Unremarkable. Skull: No fracture is seen. Sinuses/Orbits: There is mild mucosal thickening in the ethmoid sinus. Other: There is increased amount of CSF insula suggesting partial empty sella. IMPRESSION: No acute intracranial findings are seen in noncontrast CT brain. 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 Mild chronic ethmoid sinusitis. Electronically Signed   By: Elmer Picker M.D.   On: 01/30/2022 14:53   ? ?Procedures ?Procedures  ?Normal sinus rhythm ? ?Medications Ordered in ED ?Medications  ?LORazepam (ATIVAN) injection 1 mg (has no administration in time range)  ? ? ?ED Course/ Medical Decision Making/ A&P ?  ?                        ?Medical Decision Making ?Amount and/or Complexity of Data Reviewed ?Labs: ordered. ?Radiology: ordered. ? ?Risk ?Prescription drug management. ? ? ?Patient presents to the ED for concern of difficulty speaking, decreased for sensation on the left side and left-sided facial droop.  Differential includes but is not limited to CVA/TIA, seizure and brain mass. ? ?Additional history obtained from internal and external records: I was able to review patient's internal and external records where she has had presentations complaining of chest pain, shortness of breath and work-ups have always been negative.  Patient shows an increased  level of anxiety. However she has never had any visits for neurological complaints.  Has never had brain imaging.  Says she has never seen a neurologist.  Low risk for CVA however work-up initiated ? ? ?PE: Physical exam consistent with patient's history.  She reports that left-sided facial, extremity testing feel "light" when compared to the right side. ? ? ?Lab Tests: ? ?I ordered and viewed labs. The pertinent results include:  ? ?Imaging Studies ordered: ? ?I ordered and individually viewed patient's head CT and saw no abnormalities.  Radiologist read is below: ? ?No acute intracranial findings are seen in noncontrast CT brain. 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 Mild chronic ethmoid sinusitis. ? ? ?Cardiac Monitoring: ? ?The patient was maintained on a cardiac monitor.  I personally viewed and interpreted the cardiac monitored which showed normal sinus rhythm throughout patient's bedside evaluations. ? ? ?Consultations Obtained: ? ?I consulted Dr. Lorrin Goodell with neurology who suggested an MRI.  If MRI is negative the plan will be for her to follow-up with him in clinic. ? ?Treatment: ? ?Medications: ? ?I ordered Ativan for patient's anxiety.  She reports that this helped her. ? ?Dispo: ? ?MRI brain pending at this time.  Patient signed out to [place], [place], who will follow up on the results. ? ?  ?Rhae Hammock, PA-C ?01/30/22 1610 ? ?  ?Regan Lemming, MD ?01/30/22 2119 ? ?

## 2022-02-02 ENCOUNTER — Encounter (HOSPITAL_COMMUNITY): Payer: Self-pay

## 2022-02-02 ENCOUNTER — Other Ambulatory Visit: Payer: Self-pay

## 2022-02-02 ENCOUNTER — Emergency Department (HOSPITAL_COMMUNITY): Payer: BC Managed Care – PPO

## 2022-02-02 ENCOUNTER — Emergency Department (HOSPITAL_COMMUNITY)
Admission: EM | Admit: 2022-02-02 | Discharge: 2022-02-03 | Disposition: A | Discharge status: Home / Self Care | Payer: BC Managed Care – PPO | Attending: Emergency Medicine | Admitting: Emergency Medicine

## 2022-02-02 DIAGNOSIS — I10 Essential (primary) hypertension: Secondary | ICD-10-CM | POA: Diagnosis not present

## 2022-02-02 DIAGNOSIS — R531 Weakness: Secondary | ICD-10-CM | POA: Insufficient documentation

## 2022-02-02 DIAGNOSIS — Z20822 Contact with and (suspected) exposure to covid-19: Secondary | ICD-10-CM | POA: Diagnosis not present

## 2022-02-02 DIAGNOSIS — R0689 Other abnormalities of breathing: Secondary | ICD-10-CM | POA: Diagnosis not present

## 2022-02-02 DIAGNOSIS — R29898 Other symptoms and signs involving the musculoskeletal system: Secondary | ICD-10-CM

## 2022-02-02 DIAGNOSIS — R29810 Facial weakness: Secondary | ICD-10-CM | POA: Diagnosis not present

## 2022-02-02 DIAGNOSIS — G459 Transient cerebral ischemic attack, unspecified: Secondary | ICD-10-CM | POA: Diagnosis not present

## 2022-02-02 DIAGNOSIS — R791 Abnormal coagulation profile: Secondary | ICD-10-CM | POA: Insufficient documentation

## 2022-02-02 LAB — CBC
HCT: 40.2 % (ref 36.0–46.0)
Hemoglobin: 13.3 g/dL (ref 12.0–15.0)
MCH: 27.9 pg (ref 26.0–34.0)
MCHC: 33.1 g/dL (ref 30.0–36.0)
MCV: 84.5 fL (ref 80.0–100.0)
Platelets: 230 10*3/uL (ref 150–400)
RBC: 4.76 MIL/uL (ref 3.87–5.11)
RDW: 12.8 % (ref 11.5–15.5)
WBC: 7.8 10*3/uL (ref 4.0–10.5)
nRBC: 0 % (ref 0.0–0.2)

## 2022-02-02 LAB — DIFFERENTIAL
Abs Immature Granulocytes: 0.02 10*3/uL (ref 0.00–0.07)
Basophils Absolute: 0 10*3/uL (ref 0.0–0.1)
Basophils Relative: 0 %
Eosinophils Absolute: 0.2 10*3/uL (ref 0.0–0.5)
Eosinophils Relative: 2 %
Immature Granulocytes: 0 %
Lymphocytes Relative: 35 %
Lymphs Abs: 2.7 10*3/uL (ref 0.7–4.0)
Monocytes Absolute: 0.6 10*3/uL (ref 0.1–1.0)
Monocytes Relative: 8 %
Neutro Abs: 4.2 10*3/uL (ref 1.7–7.7)
Neutrophils Relative %: 55 %

## 2022-02-02 LAB — PROTIME-INR
INR: 0.9 (ref 0.8–1.2)
Prothrombin Time: 12 seconds (ref 11.4–15.2)

## 2022-02-02 LAB — APTT: aPTT: 28 seconds (ref 24–36)

## 2022-02-02 IMAGING — CT CT HEAD W/O CM
5 of 6 series · 17 of 47 positions shown, 18 images · non-contrast
Comparison: [DATE]

CLINICAL DATA: Weakness for several days increased today with
facial droop



[Series 2: head w o · axial · 0.42mm/px · z∈[+124,+174]mm · 2 of 32 slices shown, 3 images]
[im 11/32  brain]
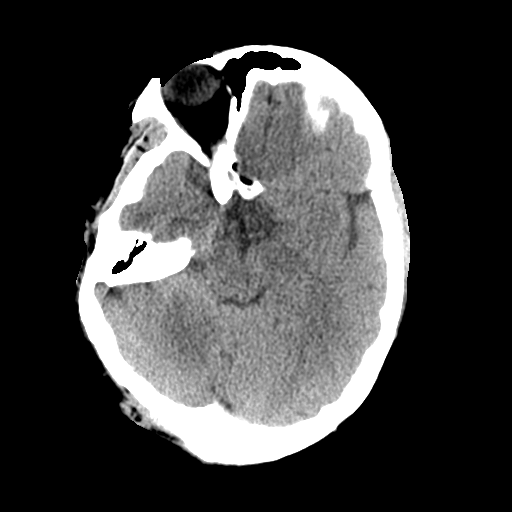
[im 11/32  bone]
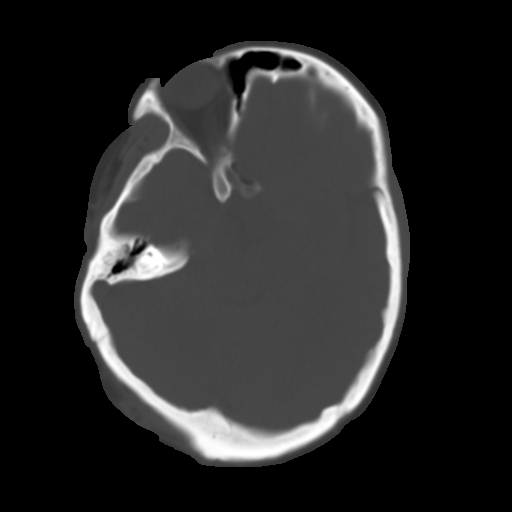
[im 21/32  brain]
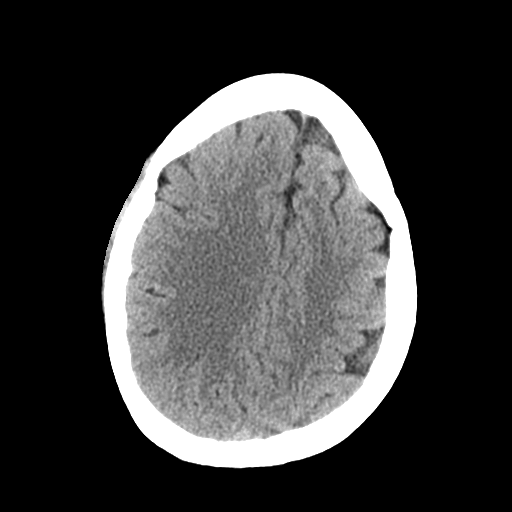

[Series 3: head bone · axial · 0.42mm/px · z∈[+88,+200]mm · 7 of 80 slices shown]
[im 8/80  bone]
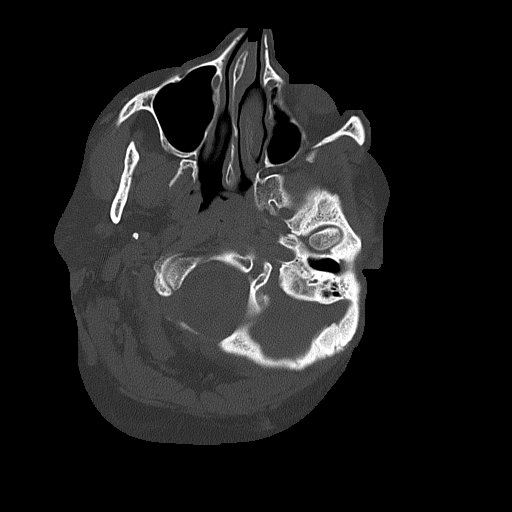
[im 16/80  bone]
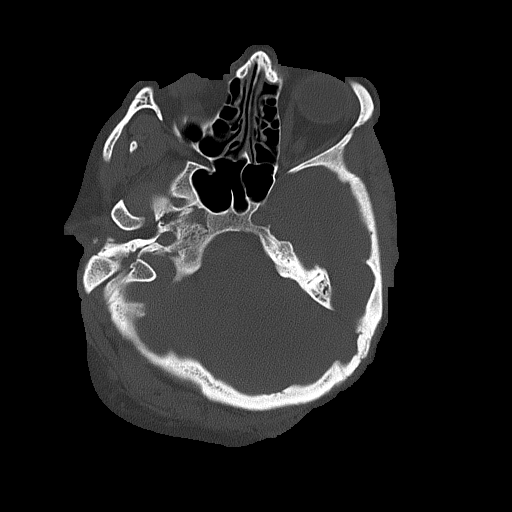
[im 24/80  bone]
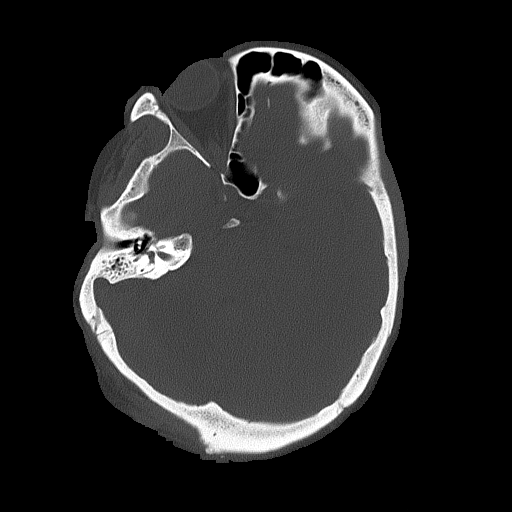
[im 32/80  bone]
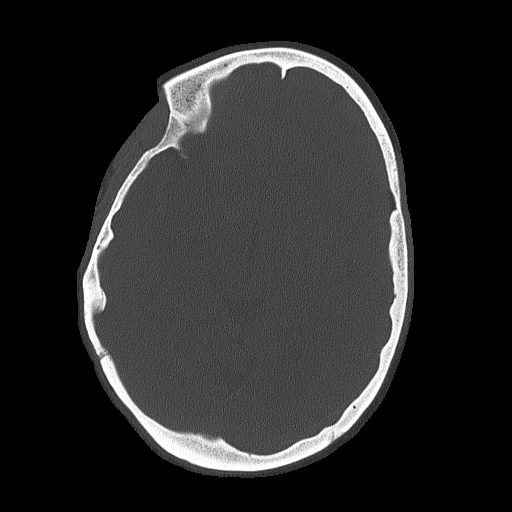
[im 48/80  bone]
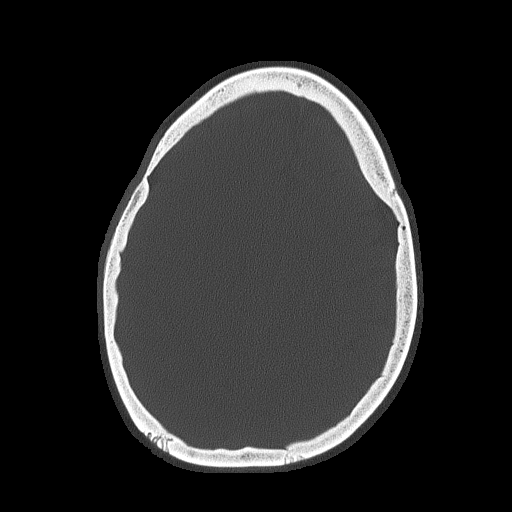
[im 56/80  bone]
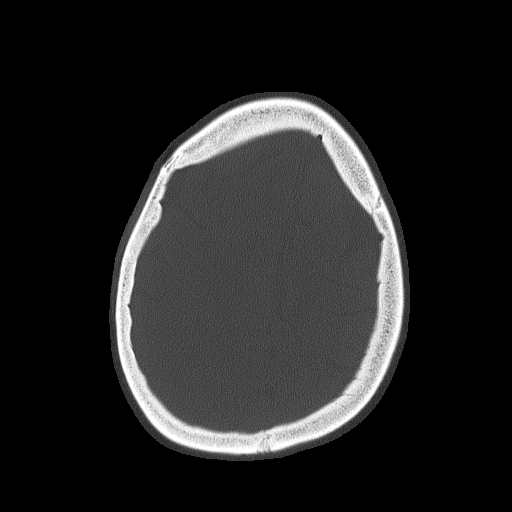
[im 64/80  bone]
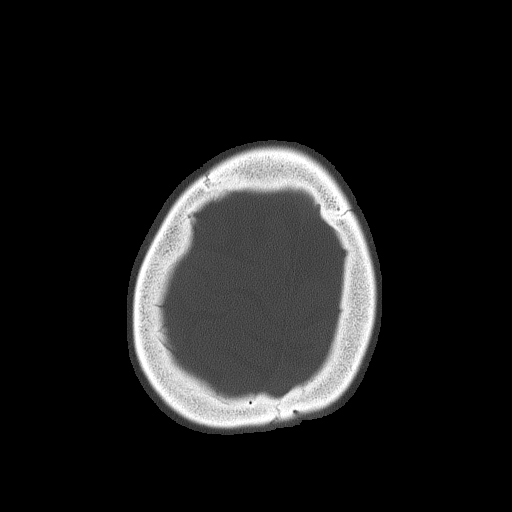

[Series 4: coronal soft · coronal · 0.32mm/px · 3 of 69 slices shown]
[im 23/69  brain]
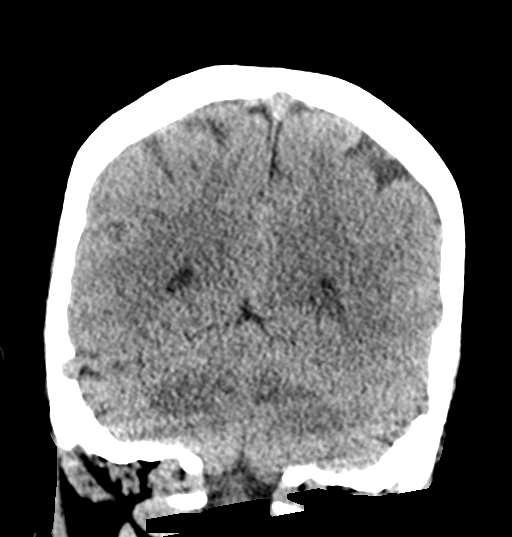
[im 31/69  brain]
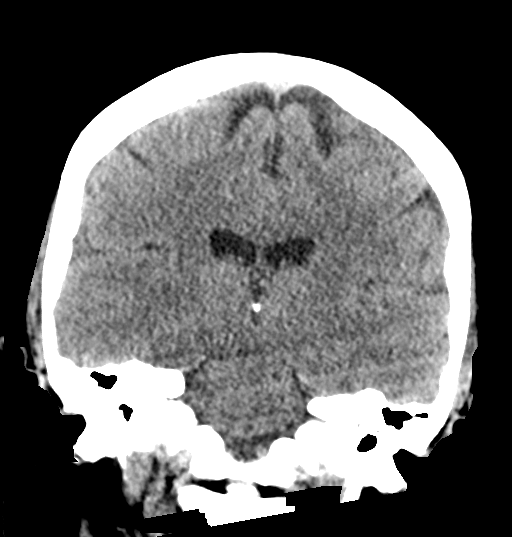
[im 38/69  brain]
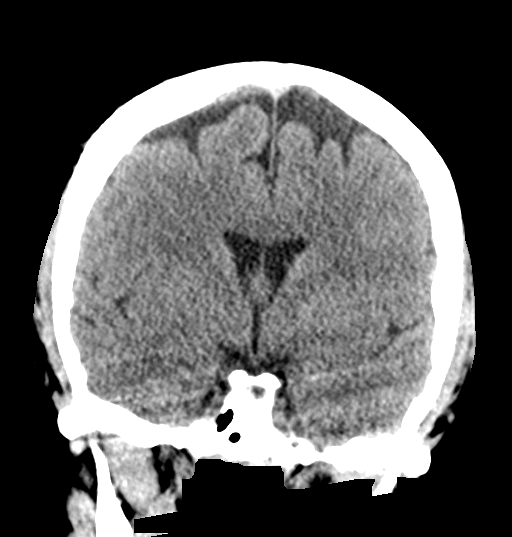

[Series 5: sagittal soft · sagittal · 0.33mm/px · 3 of 53 slices shown]
[im 22/53  brain]
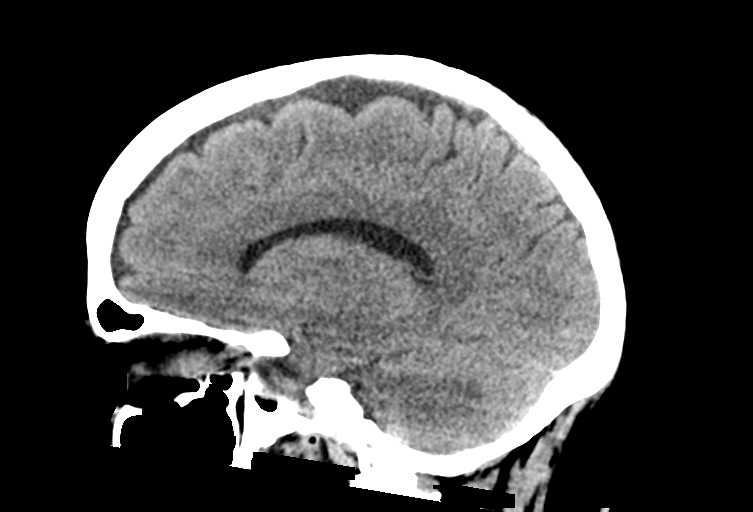
[im 27/53  brain]
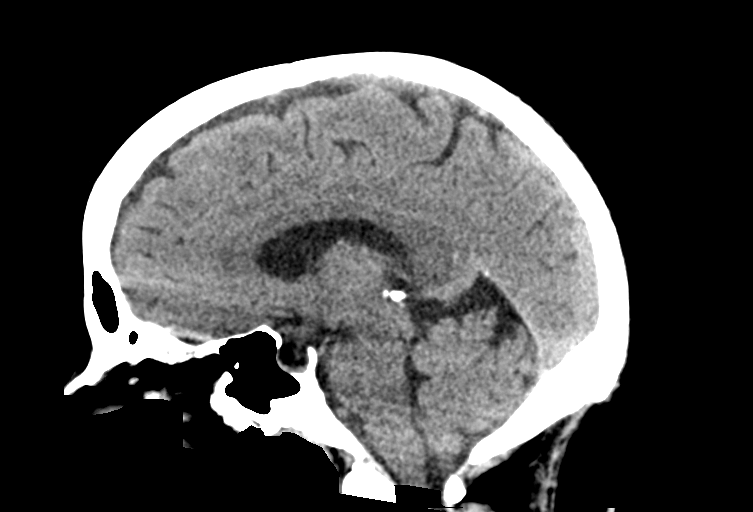
[im 32/53  brain]
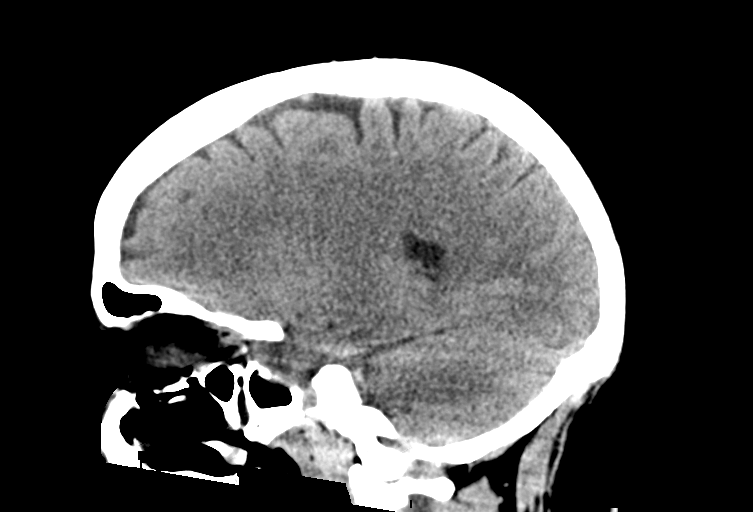

[Series 6: head ax w o · axial · 0.33mm/px · z∈[+139,+186]mm · 2 of 31 slices shown]
[im 11/31  brain]
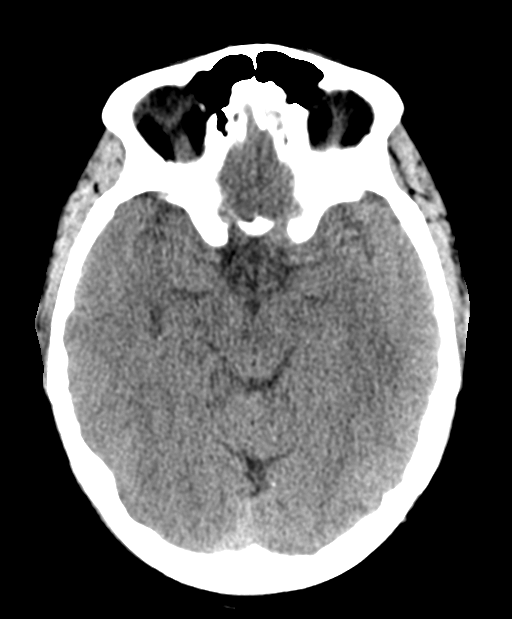
[im 21/31  brain]
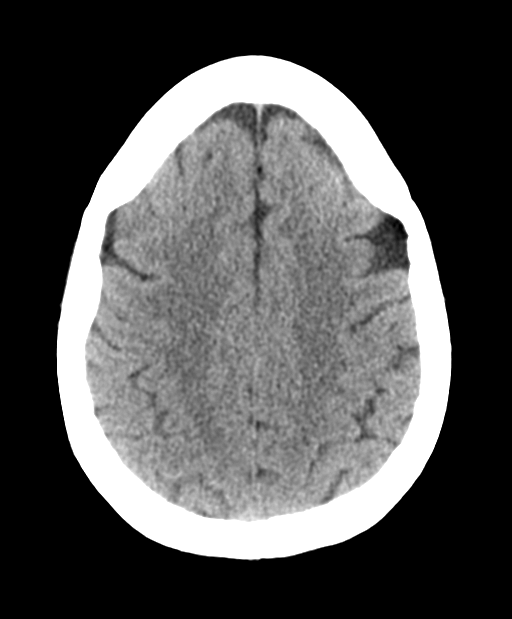

[17 of 47 positions shown; findings below may reference images not displayed]

FINDINGS: Brain: No evidence of acute infarction, hemorrhage, hydrocephalus,
extra-axial collection or mass lesion/mass effect.

Vascular: No hyperdense vessel or unexpected calcification.

Skull: Normal. Negative for fracture or focal lesion.

Sinuses/Orbits: No acute finding.

Other: None.
IMPRESSION: No acute intracranial abnormality noted. No significant interval
change is noted.

## 2022-02-02 MED ORDER — LACTATED RINGERS IV BOLUS
1000.0000 mL | Freq: Once | INTRAVENOUS | Status: AC
  Administered 2022-02-02: 1000 mL via INTRAVENOUS

## 2022-02-02 MED ORDER — DIAZEPAM 5 MG/ML IJ SOLN
5.0000 mg | Freq: Once | INTRAMUSCULAR | Status: AC
  Administered 2022-02-02: 5 mg via INTRAVENOUS
  Filled 2022-02-02: qty 2

## 2022-02-02 NOTE — ED Provider Notes (Signed)
[facility] EMERGENCY DEPARTMENT Provider Note   CSN: [id number] Arrival date & time: 02/02/22  2150     History  Chief Complaint  Patient presents with   Weakness    Allison Galloway is a 41 y.o. female.   Weakness Patient presents to the ED for left leg weakness.  Recent history is as follows: Late last week she developed a stutter, facial droop, and left-sided weakness.  She presented to [facility] on Saturday.  At that time, she underwent imaging, including MRI of brain.  Neurology was consulted.  MRI brain showed a meningioma but no other acute findings to explain her symptoms.  She was advised to follow-up with neurology and neurosurgery in the outpatient setting.  The patient's symptoms of stuttering, facial droop, left-sided weakness have continued since she was previously seen.  Additionally, she developed worsening severity of left leg weakness today.  This morning, she was able to walk.  Following this worsening of leg weakness, she has been nonambulatory.  For this reason, she presents to the ED.    Home Medications Prior to Admission medications   Medication Sig Start Date End Date Taking? Authorizing Provider  albuterol (VENTOLIN HFA) 108 (90 Base) MCG/ACT inhaler Inhale 1-2 puffs into the lungs every 6 (six) hours as needed for wheezing or shortness of breath. 01/04/22 01/04/23  [provider]  FLUoxetine (PROZAC) 40 MG capsule Take 40 mg by mouth every other day.    [provider]  fluticasone-salmeterol (ADVAIR HFA) 230-21 MCG/ACT inhaler Inhale 2 puffs into the lungs 2 (two) times daily. 01/21/22   Freddi Starr, MD  ipratropium-albuterol (DUONEB) 0.5-2.5 (3) MG/3ML SOLN Take 3 mLs by nebulization every 6 (six) hours as needed (sob/wheezing). 01/10/22   [provider]      Allergies    Patient has no known allergies.    Review of Systems   Review of Systems  Neurological:  Positive for facial asymmetry, speech difficulty and weakness.   All other systems reviewed and are negative.  Physical Exam Updated Vital Signs BP (!) 168/88 (BP Location: Right Arm)    Pulse 64    Temp 98.4 F (36.9 C) (Oral)    Resp 18    Ht '5\' 3"'$  (1.6 m)    Wt 83.5 kg    LMP  (LMP Unknown)    SpO2 99%    BMI 32.59 kg/m  Physical Exam Vitals and nursing note reviewed.  Constitutional:      General: She is not in acute distress.    Appearance: She is well-developed. She is not ill-appearing, toxic-appearing or diaphoretic.  HENT:     Head: Normocephalic and atraumatic.     Right Ear: External ear normal.     Left Ear: External ear normal.     Nose: Nose normal.     Mouth/Throat:     Mouth: Mucous membranes are moist.     Pharynx: Oropharynx is clear.  Eyes:     Extraocular Movements: Extraocular movements intact.     Conjunctiva/sclera: Conjunctivae normal.  Cardiovascular:     Rate and Rhythm: Normal rate and regular rhythm.     Heart sounds: No murmur heard. Pulmonary:     Effort: Pulmonary effort is normal. No respiratory distress.  Abdominal:     Palpations: Abdomen is soft.     Tenderness: There is no abdominal tenderness.  Musculoskeletal:        General: No swelling. Normal range of motion.     Cervical back:  Normal range of motion and neck supple.     Right lower leg: No edema.     Left lower leg: No edema.  Skin:    General: Skin is warm and dry.     Capillary Refill: Capillary refill takes less than 2 seconds.     Coloration: Skin is not jaundiced or pale.  Neurological:     Mental Status: She is alert and oriented to person, place, and time.     Comments: Stuttering speech; facial asymmetry in area of mouth; endorses diminished sensation on left leg; has equal strength in bilateral upper extremities; left leg weakness; negative Hoover sign suggesting factitious disorder    ED Results / Procedures / Treatments   Labs (all labs ordered are listed, but only abnormal results are displayed) Labs Reviewed  COMPREHENSIVE  METABOLIC PANEL - Abnormal; Notable for the following components:      Result Value   Glucose, Bld 131 (*)    All other components within normal limits  URINALYSIS, ROUTINE W REFLEX MICROSCOPIC - Abnormal; Notable for the following components:   Color, Urine STRAW (*)    All other components within normal limits  RESP PANEL BY RT-PCR (FLU A&B, COVID) ARPGX2  ETHANOL  PROTIME-INR  APTT  CBC  DIFFERENTIAL  RAPID URINE DRUG SCREEN, HOSP PERFORMED  POC URINE PREG, ED    EKG None  Radiology CT HEAD WO CONTRAST  Result Date: 02/02/2022 CLINICAL DATA:  Weakness for several days increased today with facial droop EXAM: CT HEAD WITHOUT CONTRAST TECHNIQUE: Contiguous axial images were obtained from the base of the skull through the vertex without intravenous contrast. RADIATION DOSE REDUCTION: This exam was performed according to the departmental dose-optimization program which includes automated exposure control, adjustment of the mA and/or kV according to patient size and/or use of iterative reconstruction technique. COMPARISON:  01/30/2022 FINDINGS: Brain: No evidence of acute infarction, hemorrhage, hydrocephalus, extra-axial collection or mass lesion/mass effect. Vascular: No hyperdense vessel or unexpected calcification. Skull: Normal. Negative for fracture or focal lesion. Sinuses/Orbits: No acute finding. Other: None. IMPRESSION: No acute intracranial abnormality noted. No significant interval change is noted. Electronically Signed   By: Inez Catalina M.D.   On: 02/02/2022 23:46    Procedures Procedures    Medications Ordered in ED Medications  diazepam (VALIUM) injection 5 mg (5 mg Intravenous Given 02/02/22 2328)  lactated ringers bolus 1,000 mL (0 mLs Intravenous Stopped 02/03/22 0018)    ED Course/ Medical Decision Making/ A&P                           Medical Decision Making Amount and/or Complexity of Data Reviewed Labs: ordered. Radiology: ordered.  Risk Prescription drug  management.   This patient presents to the ED for concern of worsening left leg weakness, this involves an extensive number of treatment options, and is a complaint that carries with it a high risk of complications and morbidity.  The differential diagnosis includes CVA, neoplasm, factitious disorder   Co morbidities that complicate the patient evaluation  Anxiety, depression, constipation   Additional history obtained:  Additional history obtained from patient's sister-in-law External records from outside source obtained and reviewed including EMR   Lab Tests:  I Ordered, and personally interpreted labs.  The pertinent results include: Pending at time of signout   Imaging Studies ordered:  I ordered imaging studies including CT head I independently visualized and interpreted imaging which showed pending at time of signout  I agree with the radiologist interpretation   Cardiac Monitoring:  The patient was maintained on a cardiac monitor.  I personally viewed and interpreted the cardiac monitored which showed an underlying rhythm of: Sinus rhythm   Medicines ordered and prescription drug management:  I ordered medication including Valium for anxiety   Problem List / ED Course:  Patient is a 41 year old female with history of anxiety and depression, presenting for worsening left leg weakness.  She developed left-sided weakness late last week and was seen in [facility] for the symptoms, in addition to new onset stuttering and facial asymmetry.  She did undergo imaging, including MRI brain which was negative for any acute findings to explain these neurologic deficits.  Since her discharge 3 days ago, she has had persistent symptoms as well as development of worsening left leg weakness today.  Patient's vital signs are normal upon arrival.  On neurologic testing, patient has equal strength in bilateral upper extremities.  On testing of strength in lower extremities, patient has a  negative Hoover sign suggestive of factitious disorder.  Lab work and CT scan of head were ordered.  Valium was given.  Care of patient was signed out to oncoming ED provider.    Social Determinants of Health:  Normally able to live independently           Final Clinical Impression(s) / ED Diagnoses Final diagnoses:  Left leg weakness    Rx / DC Orders ED Discharge Orders     None         Godfrey Pick, MD 02/03/22 1552

## 2022-02-02 NOTE — ED Triage Notes (Signed)
Weakness started in feburary, this past Saturday she started having left sided facial droop and stuttering, was dx with a brain tumor.  ? ?Today she started having left sided leg weakness, which is the reason she is here ?

## 2022-02-02 NOTE — Telephone Encounter (Signed)
Pt aware ppw was faxed on 01/29/22 ?

## 2022-02-03 LAB — COMPREHENSIVE METABOLIC PANEL
ALT: 26 U/L (ref 0–44)
AST: 20 U/L (ref 15–41)
Albumin: 3.7 g/dL (ref 3.5–5.0)
Alkaline Phosphatase: 52 U/L (ref 38–126)
Anion gap: 8 (ref 5–15)
BUN: 11 mg/dL (ref 6–20)
CO2: 23 mmol/L (ref 22–32)
Calcium: 8.9 mg/dL (ref 8.9–10.3)
Chloride: 106 mmol/L (ref 98–111)
Creatinine, Ser: 0.95 mg/dL (ref 0.44–1.00)
GFR, Estimated: >60 mL/min (ref >60)
Glucose, Bld: 131 mg/dL — ABNORMAL HIGH (ref 70–99)
Potassium: 3.7 mmol/L (ref 3.5–5.1)
Sodium: 137 mmol/L (ref 135–145)
Total Bilirubin: 0.4 mg/dL (ref 0.3–1.2)
Total Protein: 7.1 g/dL (ref 6.5–8.1)

## 2022-02-03 LAB — URINALYSIS, ROUTINE W REFLEX MICROSCOPIC
Bilirubin Urine: NEGATIVE
Glucose, UA: NEGATIVE mg/dL
Hgb urine dipstick: NEGATIVE
Ketones, ur: NEGATIVE mg/dL
Leukocytes,Ua: NEGATIVE
Nitrite: NEGATIVE
Protein, ur: NEGATIVE mg/dL
Specific Gravity, Urine: 1.008 (ref 1.005–1.030)
pH: 5 (ref 5.0–8.0)

## 2022-02-03 LAB — RESP PANEL BY RT-PCR (FLU A&B, COVID) ARPGX2
Influenza A by PCR: NEGATIVE
Influenza B by PCR: NEGATIVE
SARS Coronavirus 2 by RT PCR: NEGATIVE

## 2022-02-03 LAB — RAPID URINE DRUG SCREEN, HOSP PERFORMED
Amphetamines: NOT DETECTED
Barbiturates: NOT DETECTED
Benzodiazepines: NOT DETECTED
Cocaine: NOT DETECTED
Opiates: NOT DETECTED
Tetrahydrocannabinol: NOT DETECTED

## 2022-02-03 LAB — ETHANOL: Alcohol, Ethyl (B): <10 mg/dL (ref <10)

## 2022-02-03 LAB — POC URINE PREG, ED: Preg Test, Ur: NEGATIVE

## 2022-02-03 NOTE — Discharge Instructions (Addendum)
At this time, all of your testing has been reassuring.  You have not had a stroke.  The small meningioma seen on your MRI is not causing your symptoms and is not likely to cause any symptoms.  You can follow-up with neurosurgery as they will monitor this, but no treatment will be necessary at this time. ? ?Some of your symptoms may be due to increased stress, it may be beneficial for you to talk to psychiatry and/or therapist to help with some of your anxiety about your health. ?

## 2022-02-03 NOTE — ED Provider Notes (Signed)
Patient signed out to me by Dr. Doren Custard to follow-up on work-up.  Patient seen with left-sided weakness. ? ?Patient has developed a left facial droop over the last couple of weeks.  She was seen at [facility] and had an MRI for these findings that showed an incidental small meningioma, no stroke.  Since that time she has developed stuttering speech patterns and a left leg weakness. ? ?Exam findings are atypical.  Neurology consultation has been performed and is appreciated.  They did not appreciate any findings on the exam or imaging that would warrant further neurology work-up.  Recommendation is for psychiatric evaluation.  Patient does not require inpatient psychiatric treatment at this time, ?  ?Orpah Greek, MD ?02/03/22 [phone] ? ?

## 2022-02-03 NOTE — Consult Note (Signed)
[facility] ?[facility] Tele[facility] ? ?Stat Consult ? ?Patient Name:   Allison Galloway, Allison Galloway ?Date of Birth:   03-30-1981 ?Identification Number:   MRN - [id number] ?Date of Service:   02/03/2022 02:05:37 ? ?Diagnosis: ?      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      R29.810 - Facial numbness/ Facial weakness ?      R53.1 - Weakness ? ?Impression ?41 yo female with a hx of anxiety , depression, migraines (1-2/ month)who reports multiple symptoms over 3 weeks with Reported SOB, difficulty swallowing (needs to take small bites of food in order to get it down effectively), and stuttering speech All started in the past 3 to 4 weeks according to patient. She subsequently reports that she woke up last Saturday with left facial weaknes, and yesterday am left leg started giving out on her.She also reports that her left side feels numb. Also complaining of HA in left posterior head for 5 days, different than her usual migraines. ?Denies increased stress other than normal.She does not feel her depression and anxiety are out of control. As a result of her difficulties, she is on leave from work. She did apparently visit another emergency department a few days ago. She had an MRI brain which was negative except for an incidental tiny right 6 mm meningioma in the frontal region without mass effect or significant edema. ?Etiology for her multiple complaints including focal neurologic complaints surfacing over the past few weeks is somewhat unclear. Her exam is suspicious for possible nonorganicity. Imaging without evidence to suggest stroke or structural cause for her symptoms. Incidental small suspected right frontal meningioma unlikely to be contributing to the symptoms. Suspect possible stroke mimic, possible nonorganic in etiology. Suggest psychiatry input for suspected conversion disorder ? ?Our recommendations are outlined below. ? ?Laboratory Studies: ?Hemoglobin  A1c ? ?Antithrombotic Medications: ?Aspirin 81 mg PO daily ? ?Consultations: ?Recommend Speech therapy if failed dysphagia screen ?Physical therapy/Occupational therapy ? ?Disposition: ?No further recommendations ?Outpatient Neurology follow up in 1-3 weeks ? ? ? ?---------------------------------------------------------------------------------------------------- ? ?Metrics: ?[facility] Notification Time: 02/03/2022 02:04:00 ?Stamp Time: 02/03/2022 02:05:37 ?Callback Response Time: 02/03/2022 02:06:05 ? ? ?CT HEAD: ?As Per Radiologist CT Head Showed No Acute Hemorrhage or Acute Core Infarct ?Reviewed ? ? ?Imaging ?MRI brain 01/30/2022, negative except for small suspected meningioma 6 mm in right mid posterior frontal lobe without significant mass effect on underlying brain parenchyma and no significant edema ? ?Labs ?Labs reviewed, negative pregnancy testing, normal CBC, normal CMP ?Negative urine drug screen and EtOH level ?Normal PT/PTT ? ? ?---------------------------------------------------------------------------------------------------- ? ?Chief Complaint: ?Problem swallowing multiple complaints including shortness of breath, stuttering speech, balance issues, left-sided weakness and leg giving out, left facial weakness, swallowing difficultie ? ?History of Present Illness: ?Patient is a 41 year old Female. ?41 yo female with a hx of anxiety , depression, migraines (1-2/ month)who reports multiple symptoms over 3 weeks with Reported SOB, difficulty swallowing (needs to take small bites of food in order to get it down effectively), and stuttering speech All started in the past 3 to 4 weeks according to patient. She subsequently reports that she woke up last Saturday with left facial weaknes, and yesterday am left leg started giving out on her.She also reports that her left side feels numb. Also complaining of HA in left posterior head for 5 days, different than her usual migraines. ?Denies increased stress  other than normal.She does not feel her depression and anxiety are out of control. As  a result of her difficulties, she is on leave from work. She did apparently visit another emergency department a few days ago. She had an MRI brain which was negative except for an incidental tiny right 6 mm meningioma in the frontal region without mass effect or significant edema. ?  ? ?Past Medical History: ?     Covid-19 ?     Migraine Headaches ?     There is no history of Hypertension ?     There is no history of Diabetes Mellitus ?     There is no history of Hyperlipidemia ?     There is no history of Atrial Fibrillation ?     There is no history of Coronary Artery Disease ?     There is no history of Stroke ?     There is no history of Seizures ?Other PMH:  Depression/ Anxiety ? Asthma ? ?Medications: ? ?No Anticoagulant use  ?No Antiplatelet use ?Reviewed EMR for current medications ?Other Medications Pertinent To Assessment Include: Prozac ? Albuterol ? Advair ? ?Allergies:  ?Reviewed,NKDA ? ?Social History: ?Patient Is: Married ?Current Employee : On leave - manufacturing ?  ?Smoking: Former ?Alcohol Use: No ?Drug Use: No ? ?Family History: ? ?There Is Family History Of: CAD ? Seizures- daughter, aunts ?There is no family history of premature cerebrovascular disease pertinent to this consultation ? ?ROS : ?14 Points Review of Systems was performed and was negative except mentioned in HPI. ? ?Past Surgical History: ?There Is No Surgical History Contributory To Today?s Visit ?There Is Surgical History of:  Left knee surgery X3 ? ?  ?Examination: ?BP(137/101), Pulse(64), Blood Glucose(131) ?1A: Level of Consciousness - Alert; keenly responsive + 0 ?1B: Ask Month and Age - Both Questions Right + 0 ?1C: Blink Eyes & Squeeze Hands - Performs Both Tasks + 0 ?2: Test Horizontal Extraocular Movements - Normal + 0 ?3: Test Visual Fields - No Visual Loss + 0 ?4: Test Facial Palsy (Use Grimace if Obtunded) - Partial paralysis (lower  face) + 2 ?5A: Test Left Arm Motor Drift - Drift, but doesn't hit bed + 1 ?5B: Test Right Arm Motor Drift - No Drift for 10 Seconds + 0 ?6A: Test Left Leg Motor Drift - Drift, hits bed + 2 ?6B: Test Right Leg Motor Drift - No Drift for 5 Seconds + 0 ?7: Test Limb Ataxia (FNF/Heel-Shin) - Ataxia in 1 Limb + 1 ?8: Test Sensation - Mild-Moderate Loss: Less Sharp/More Dull + 1 ?9: Test Language/Aphasia - Mild-Moderate Aphasia: Some Obvious Changes, Without Significant Limitation + 1 ?10: Test Dysarthria - Normal + 0 ?11: Test Extinction/Inattention - No abnormality + 0 ? ?NIHSS Score: 8 ?NIHSS Free Text : Atypical stuttering speech ?lower facial weakness left ?Left arm and leg weakness (inconsistent) ?Diminished left face, arm, and leg ? ? ? ? ?Patient / Family was informed the Neurology Consult would occur via TeleHealth consult by way of interactive audio and video telecommunications and consented to receiving care in this manner. ? ?Patient is being evaluated for possible acute neurologic impairment and high probability of imminent or life - threatening deterioration.I spent total of 35 minutes providing care to this patient, including time for face to face visit via telemedicine, review of medical records, imaging studies and discussion of findings with providers, the patient and / or family. ? ? ?Dr Daria Pastures ? ? ?[facility] ?[phone] ? ?Case [id number] ?

## 2022-02-03 NOTE — ED Notes (Signed)
Teleneurology called in and assessed patient. Will talk to ERMD on next steps. ?

## 2022-02-04 ENCOUNTER — Ambulatory Visit: Payer: BC Managed Care – PPO | Admitting: Family Medicine

## 2022-02-04 DIAGNOSIS — D329 Benign neoplasm of meninges, unspecified: Secondary | ICD-10-CM | POA: Diagnosis not present

## 2022-02-05 ENCOUNTER — Encounter: Payer: Self-pay | Admitting: Family Medicine

## 2022-02-09 DIAGNOSIS — D329 Benign neoplasm of meninges, unspecified: Secondary | ICD-10-CM | POA: Diagnosis not present

## 2022-02-09 DIAGNOSIS — R0602 Shortness of breath: Secondary | ICD-10-CM | POA: Diagnosis not present

## 2022-02-09 DIAGNOSIS — Z8709 Personal history of other diseases of the respiratory system: Secondary | ICD-10-CM | POA: Diagnosis not present

## 2022-02-09 DIAGNOSIS — J3489 Other specified disorders of nose and nasal sinuses: Secondary | ICD-10-CM | POA: Diagnosis not present

## 2022-02-17 ENCOUNTER — Other Ambulatory Visit: Payer: Self-pay | Admitting: Student

## 2022-02-17 ENCOUNTER — Other Ambulatory Visit (HOSPITAL_COMMUNITY): Payer: Self-pay | Admitting: Student

## 2022-02-17 DIAGNOSIS — D329 Benign neoplasm of meninges, unspecified: Secondary | ICD-10-CM

## 2022-02-22 ENCOUNTER — Ambulatory Visit: Payer: BC Managed Care – PPO | Admitting: Pulmonary Disease

## 2022-02-22 ENCOUNTER — Encounter: Payer: Self-pay | Admitting: Pulmonary Disease

## 2022-02-22 ENCOUNTER — Other Ambulatory Visit: Payer: Self-pay

## 2022-02-22 VITALS — BP 130/82 | HR 83 | Ht 63.0 in | Wt 180.0 lb

## 2022-02-22 DIAGNOSIS — R0602 Shortness of breath: Secondary | ICD-10-CM

## 2022-02-22 NOTE — Progress Notes (Signed)
? ?Synopsis: Referred in February 2023 for Shortness of Breath ? ?Subjective:  ? ?PATIENT ID: Allison Galloway GENDER: female DOB: Sep 28, 1981, MRN: [id number] ? ?HPI ? ?Chief Complaint  ?Patient presents with  ? Follow-up  ?  4 wk f/u for SOB. States she has been doing well since last visit.  ?  ? ?Allison Galloway is a 41 year old woman, former smoker who returns to pulmonary clinic for shortness of breath.  ? ?She saw ENT 02/09/22, no vocal cord issues or lesions noted on flexible laryngoscopy.  ? ?Spirometry from last visit was suggestive of a restrictive process.  ? ?She notes some improvement in her breathing since starting advair HFA 115-29mg 2 puffs twice daily.  ? 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 She has follow up MRI and evaluation with Neurosurgery in early April. She continues to have speech difficulty with stuttering and is complaining of left sided weakness. ? ?OV 01/21/22 ?She went to [facility] ER on 01/17/2022 for chest pain.  He reports being treated with bronchitis 3 weeks ago and completed a course of prednisone along with azithromycin.  She is having persistent shortness of breath with exertion.  She has been using albuterol nebulizer treatments without much improvement. ? ?CT chest with contrast on 01/17/2022 is without hilar or mediastinal adenopathy.  No dominant or suspicious nodules.  No focal consolidation, infiltrate or effusions present.  No pericardial effusion present.  CT of the neck showed no lymphadenopathy and no mass along her upper airway.  There is air-fluid level of the left maxillary sinus.  She had a serum bicarb level of 20 on 01/07/2022 ER visit.  EKG 01/08/2022 showed normal sinus rhythm. ? ?The shortness of breath overall began at the beginning of the month fairly abruptly.  She reports some wheezing when active with the shortness of breath.  She has intermittent cough that is dry.  She has rare heartburn.  She  denies any fevers chills or sweats.  She has not had shortness of breath like this in the past.  She denies seasonal allergies.  She has pet dogs and cats and has never had issues with allergies to them in the past.  She was previously smoking cigarettes daily and then switched over to vaping.  She has not smoked or vaped in the past month due to the shortness of breath.  She currently works at [employer] denies any dust or chemical exposures.  ? ?She has a headache she has history of anxiety and depression.  She had previously stopped any medications for treatment of these conditions and had been doing okay managing on her own.  She was recently started on fluoxetine and is taking this every other day at this time.  She was recently started on lubiprostone for issues of constipation.  She started this medication around the time her shortness of breath also started and is concerned her breathing is being affected by the new medication. ? ?She denies any specific stressors or trauma in her life that could be leading to increased anxiety. ? ?Past Medical History:  ?Diagnosis Date  ? Anxiety   ? Constipation   ? Depression   ?  ? ?Family History  ?Problem Relation Age of Onset  ? Diabetes Mother   ? Depression Mother   ? Arthritis Mother   ? Asthma Mother   ? Learning disabilities Mother   ? Mental illness Mother   ? Depression Sister   ?  Asthma Sister   ? Mental illness Sister   ? Seizures Daughter   ? Hypertension Other   ? Bipolar disorder Other   ? Anxiety disorder Other   ? Depression Other   ?  ? ?Social History  ? ?Socioeconomic History  ? Marital status: Single  ?  Spouse name: Not on file  ? Number of children: Not on file  ? Years of education: Not on file  ? Highest education level: Not on file  ?Occupational History  ? Not on file  ?Tobacco Use  ? Smoking status: Former  ?  Packs/day: 0.25  ?  Types: Cigarettes  ?  Quit date: 08/2021  ?  Years since quitting: 0.4  ? Smokeless tobacco: Never  ?Vaping Use  ?  Vaping Use: Never used  ?Substance and Sexual Activity  ? Alcohol use: No  ? Drug use: No  ? Sexual activity: Yes  ?  Birth control/protection: None  ?Other Topics Concern  ? Not on file  ?Social History Narrative  ? Not on file  ? ?Social Determinants of Health  ? ?Financial Resource Strain: Not on file  ?Food Insecurity: Not on file  ?Transportation Needs: Not on file  ?Physical Activity: Not on file  ?Stress: Not on file  ?Social Connections: Not on file  ?Intimate Partner Violence: Not on file  ?  ? ?No Known Allergies  ? ?Outpatient Medications Prior to Visit  ?Medication Sig Dispense Refill  ? albuterol (VENTOLIN HFA) 108 (90 Base) MCG/ACT inhaler Inhale 1-2 puffs into the lungs every 6 (six) hours as needed for wheezing or shortness of breath.    ? FLUoxetine (PROZAC) 40 MG capsule Take 40 mg by mouth every other day.    ? fluticasone-salmeterol (ADVAIR HFA) 230-21 MCG/ACT inhaler Inhale 2 puffs into the lungs 2 (two) times daily. 12 g 12  ? ipratropium-albuterol (DUONEB) 0.5-2.5 (3) MG/3ML SOLN Take 3 mLs by nebulization every 6 (six) hours as needed (sob/wheezing).    ? ?No facility-administered medications prior to visit.  ? ?Review of Systems  ?Constitutional:  Negative for chills, fever, malaise/fatigue and weight loss.  ?HENT:  Negative for congestion, sinus pain and sore throat.   ?Eyes: Negative.   ?Respiratory:  Positive for shortness of breath. Negative for cough, hemoptysis, sputum production and wheezing.   ?Cardiovascular:  Negative for chest pain, palpitations, orthopnea, claudication and leg swelling.  ?Gastrointestinal:  Negative for abdominal pain, heartburn, nausea and vomiting.  ?Genitourinary: Negative.   ?Musculoskeletal:  Negative for joint pain and myalgias.  ?Skin:  Negative for rash.  ?Neurological:  Positive for weakness.  ?Endo/Heme/Allergies: Negative.   ?Psychiatric/Behavioral:  Positive for depression. The patient is nervous/anxious.   ? ?Objective:  ? ?Vitals:  ? 02/22/22 0916   ?BP: 130/82  ?Pulse: 83  ?SpO2: 99%  ?Weight: 180 lb (81.6 kg)  ?Height: '5\' 3"'$  (1.6 m)  ? ?Physical Exam ?Constitutional:   ?   General: She is not in acute distress. ?   Appearance: She is not ill-appearing.  ?HENT:  ?   Head: Normocephalic and atraumatic.  ?   Mouth/Throat:  ?   Palate: No mass and lesions.  ?   Pharynx: No pharyngeal swelling or posterior oropharyngeal erythema.  ?   Tonsils: No tonsillar exudate.  ?   Comments: No stridor ?Eyes:  ?   General: No scleral icterus. ?   Conjunctiva/sclera: Conjunctivae normal.  ?Cardiovascular:  ?   Rate and Rhythm: Normal rate and regular rhythm.  ?  Pulses: Normal pulses.  ?   Heart sounds: Normal heart sounds. No murmur heard. ?Pulmonary:  ?   Effort: Pulmonary effort is normal.  ?   Breath sounds: Normal breath sounds. No wheezing, rhonchi or rales.  ?   Comments: Erratic Breathing Pattern ?Musculoskeletal:  ?   Right lower leg: No edema.  ?   Left lower leg: No edema.  ?Skin: ?   General: Skin is warm and dry.  ?Neurological:  ?   Mental Status: She is alert and oriented to person, place, and time.  ?   Motor: Weakness (left hand grip strength and left lower extremity) present.  ?Psychiatric:     ?   Mood and Affect: Mood normal.     ?   Behavior: Behavior normal.     ?   Thought Content: Thought content normal.     ?   Judgment: Judgment normal.  ? ?CBC ?   ?Component Value Date/Time  ? WBC 7.8 02/02/2022 2321  ? RBC 4.76 02/02/2022 2321  ? HGB 13.3 02/02/2022 2321  ? HCT 40.2 02/02/2022 2321  ? PLT 230 02/02/2022 2321  ? MCV 84.5 02/02/2022 2321  ? MCH 27.9 02/02/2022 2321  ? MCHC 33.1 02/02/2022 2321  ? RDW 12.8 02/02/2022 2321  ? LYMPHSABS 2.7 02/02/2022 2321  ? MONOABS 0.6 02/02/2022 2321  ? EOSABS 0.2 02/02/2022 2321  ? BASOSABS 0.0 02/02/2022 2321  ? ? ?  Latest Ref Rng & Units 02/02/2022  ? 11:21 PM 01/30/2022  ?  2:13 PM 01/21/2022  ? 10:08 AM  ?BMP  ?Glucose 70 - 99 mg/dL 131   155   127    ?BUN 6 - 20 mg/dL '11   9   14    '$ ?Creatinine 0.44 - 1.00 mg/dL  0.95   0.95   1.01    ?Sodium 135 - 145 mmol/L 137   136   138    ?Potassium 3.5 - 5.1 mmol/L 3.7   3.8   3.9    ?Chloride 98 - 111 mmol/L 106   106   108    ?CO2 22 - 32 mmol/L '23   20   20    '$ ?Calcium

## 2022-02-22 NOTE — Patient Instructions (Addendum)
Your breathing tests from last visit are suggestive of a restrictive process which could be due to the neurological process that is possibly leading to your symptoms. ? ?Continue advair HFA inhaler 2 puffs twice daily ?- rinse mouth out after each use ?  ?Continue to use albuterol inhaler as needed ? ?Follow up in 6 months with pulmonary function tests ? ? ?

## 2022-02-24 ENCOUNTER — Telehealth: Payer: Self-pay | Admitting: Pulmonary Disease

## 2022-02-24 NOTE — Telephone Encounter (Signed)
Received new Physician Medical Statement from [employer] for short term disability - emailed to [facility] for review.  ?

## 2022-02-25 NOTE — Progress Notes (Signed)
?Cardiology Office Note:   ? ?Date:  02/26/2022  ? ?ID:  Allison Galloway, DOB Oct 04, 1981, MRN [id number] ? ?PCP:  Werner Lean, MD ?  ?[facility] Providers ?Cardiologist:  None    ? ?Referring MD: Milton Ferguson, MD  ? ?CC: Feels ok ?Consulted for the evaluation of SOB at the behest of Werner Lean, MD ? ?History of Present Illness:   ? ?Allison Galloway is a 41 y.o. female with a hx of Anxiety and SOB who has see Dr. Erin Fulling (pulmonology) for the evaluation of SOB with unclear etiology. ? ?Patient notes that she is feeling a new shortness of breath.   ?Notes that her breathing issues started in February. No clear events in January that precipitated; save for that she was started on lubiprostone. ?Recently diagnosed as a small meningioma.   ? ?Notes that she has new balance issues with left arm and leg issues. ? ?Has had no chest pain, chest pressure, chest tightness, chest stinging .  Patient exertion notable for shortness of breath with activity (going back into the back yard with the kids to play).  No shortness of breath, DOE mainly with exertion.  No PND or orthopnea.  No weight gain, leg swelling , or abdominal swelling.  No syncope or near syncope . Notes  no palpitations or funny heart beats.    ? ? ?Past Medical History:  ?Diagnosis Date  ? Anxiety   ? Constipation   ? Depression   ? ? ?Past Surgical History:  ?Procedure Laterality Date  ? ARTHROSCOPIC REPAIR ACL    ? CESAREAN SECTION    ? KNEE SURGERY    ? TUBAL LIGATION    ? ? ?Current Medications: ?Current Meds  ?Medication Sig  ? albuterol (VENTOLIN HFA) 108 (90 Base) MCG/ACT inhaler Inhale 1-2 puffs into the lungs every 6 (six) hours as needed for wheezing or shortness of breath.  ? FLUoxetine (PROZAC) 40 MG capsule Take 40 mg by mouth every other day.  ? fluticasone-salmeterol (ADVAIR HFA) 230-21 MCG/ACT inhaler Inhale 2 puffs into the lungs 2 (two) times daily.  ? ipratropium-albuterol (DUONEB) 0.5-2.5 (3) MG/3ML SOLN Take 3 mLs by  nebulization every 6 (six) hours as needed (sob/wheezing).  ?  ? ?Allergies:   Patient has no known allergies.  ? ?Social History  ? ?Socioeconomic History  ? Marital status: Single  ?  Spouse name: Not on file  ? Number of children: Not on file  ? Years of education: Not on file  ? Highest education level: Not on file  ?Occupational History  ? Not on file  ?Tobacco Use  ? Smoking status: Former  ?  Packs/day: 0.25  ?  Types: Cigarettes  ?  Quit date: 08/2021  ?  Years since quitting: 0.4  ? Smokeless tobacco: Never  ?Vaping Use  ? Vaping Use: Former  ?Substance and Sexual Activity  ? Alcohol use: No  ? Drug use: No  ? Sexual activity: Yes  ?  Birth control/protection: None  ?Other Topics Concern  ? Not on file  ?Social History Narrative  ? Not on file  ? ?Social Determinants of Health  ? ?Financial Resource Strain: Not on file  ?Food Insecurity: Not on file  ?Transportation Needs: Not on file  ?Physical Activity: Not on file  ?Stress: Not on file  ?Social Connections: Not on file  ?  ?Social: comes with family slight speech impediment, has a daughter ? ?Family History: ?The patient's family history includes Anxiety disorder in  an other family member; Arthritis in her mother; Asthma in her mother and sister; Bipolar disorder in an other family member; Depression in her mother, sister, and another family member; Diabetes in her mother; Hypertension in an other family member; Learning disabilities in her mother; Mental illness in her mother and sister; Seizures in her daughter. ? ?ROS:   ?Please see the history of present illness.    ? All other systems reviewed and are negative. ? ?EKGs/Labs/Other Studies Reviewed:   ? ? ?Recent Labs: ?01/07/2022: B Natriuretic Peptide 123.0 ?01/21/2022: TSH 1.40 ?02/02/2022: ALT 26; BUN 11; Creatinine, Ser 0.95; Hemoglobin 13.3; Platelets 230; Potassium 3.7; Sodium 137  ?Recent Lipid Panel ?No results found for: CHOL, TRIG, HDL, CHOLHDL, VLDL, LDLCALC, LDLDIRECT ? ?    ? ?Physical Exam:    ? ?VS:  BP (!) 138/94   Pulse 84   Ht '5\' 3"'$  (1.6 m)   Wt 181 lb (82.1 kg)   LMP  (LMP Unknown)   SpO2 97%   BMI 32.06 kg/m?    ? ?Wt Readings from Last 3 Encounters:  ?02/26/22 181 lb (82.1 kg)  ?02/22/22 180 lb (81.6 kg)  ?02/02/22 184 lb (83.5 kg)  ?  ? ?Gen: Mild  distress ?Neck: No JVD  ?Cardiac: No Rubs or Gallops, no Murmur, RRR +2 radial pulses ?Respiratory: Clear to auscultation bilaterally, normal effort, normal  respiratory rate ?GI: Soft, nontender, non-distended  ?MS: trace non pitting edema; Decreased left left movement ?Integument: Skin feels warm ?Neuro:  At time of evaluation, alert and oriented to person/place/time/situation ?Has a stuttering speech, left facial droop ?Psych: Anxious affect, patient feels stressed ? ? ?ASSESSMENT:   ? ?1. SOB (shortness of breath)   ?2. Anxiety   ?3. History of tobacco abuse   ? ?PLAN:   ? ?SOB ?Anxiety ?Hx of Tobacco abuse/Vaping ?Meningioma ?- No clear cardiac cause on exam ?- will get echo and BNP ?- no medication changes at this time ? ?APP in the fall;  if improving with normal cardiac testing will change to PRN ? ?   ? ? ?Medication Adjustments/Labs and Tests Ordered: ?Current medicines are reviewed at length with the patient today.  Concerns regarding medicines are outlined above.  ?Orders Placed This Encounter  ?Procedures  ? B Nat Peptide  ? ECHOCARDIOGRAM COMPLETE  ? ?No orders of the defined types were placed in this encounter. ? ? ?Patient Instructions  ?Medication Instructions: Your physician recommends that you continue on your current medications as directed. Please refer to the Current Medication list given to you today. ? ? ? ?Labwork: ?BNP ? ?Testing/Procedures: ?Your physician has requested that you have an echocardiogram. Echocardiography is a painless test that uses sound waves to create images of your heart. It provides your doctor with information about the size and shape of your heart and how well your heart?s chambers and valves are  working. This procedure takes approximately one hour. There are no restrictions for this procedure. ? ? ?Follow-Up: 6 months ? ? ?Any Other Special Instructions Will Be Listed Below (If Applicable). ? ? ? ? ?If you need a refill on your cardiac medications before your next appointment, please call your pharmacy. ?  ? ?Signed, ?Werner Lean, MD  ?02/26/2022 9:17 AM    ?[place] ? ?

## 2022-02-26 ENCOUNTER — Encounter: Payer: Self-pay | Admitting: Internal Medicine

## 2022-02-26 ENCOUNTER — Ambulatory Visit: Payer: BC Managed Care – PPO | Admitting: Internal Medicine

## 2022-02-26 ENCOUNTER — Telehealth: Payer: Self-pay

## 2022-02-26 ENCOUNTER — Other Ambulatory Visit (HOSPITAL_COMMUNITY)
Admission: RE | Admit: 2022-02-26 | Discharge: 2022-02-26 | Disposition: A | Discharge status: Home / Self Care | Payer: BC Managed Care – PPO | Source: Ambulatory Visit | Attending: Internal Medicine | Admitting: Internal Medicine

## 2022-02-26 VITALS — BP 138/94 | HR 84 | Ht 63.0 in | Wt 181.0 lb

## 2022-02-26 DIAGNOSIS — F419 Anxiety disorder, unspecified: Secondary | ICD-10-CM | POA: Insufficient documentation

## 2022-02-26 DIAGNOSIS — R0602 Shortness of breath: Secondary | ICD-10-CM | POA: Insufficient documentation

## 2022-02-26 DIAGNOSIS — Z87891 Personal history of nicotine dependence: Secondary | ICD-10-CM | POA: Diagnosis not present

## 2022-02-26 LAB — BRAIN NATRIURETIC PEPTIDE: B Natriuretic Peptide: 32 pg/mL (ref 0.0–100.0)

## 2022-02-26 NOTE — Telephone Encounter (Signed)
-----   Message from Werner Lean, MD sent at 02/26/2022 12:12 PM EDT ----- ?Testing Results WNL.  No change to diagnostic or therapeutic plan. ?

## 2022-02-26 NOTE — Patient Instructions (Addendum)
Medication Instructions: Your physician recommends that you continue on your current medications as directed. Please refer to the Current Medication list given to you today. ? ? ? ?Labwork: ?BNP ? ?Testing/Procedures: ?Your physician has requested that you have an echocardiogram. Echocardiography is a painless test that uses sound waves to create images of your heart. It provides your doctor with information about the size and shape of your heart and how well your heart?s chambers and valves are working. This procedure takes approximately one hour. There are no restrictions for this procedure. ? ? ?Follow-Up: 6 months ? ? ?Any Other Special Instructions Will Be Listed Below (If Applicable). ? ? ? ? ?If you need a refill on your cardiac medications before your next appointment, please call your pharmacy. ? ?

## 2022-02-26 NOTE — Telephone Encounter (Signed)
Patient notified and verbalized understanding. Pt had no questions or concerns at this time 

## 2022-03-02 ENCOUNTER — Ambulatory Visit (HOSPITAL_COMMUNITY)
Admission: RE | Admit: 2022-03-02 | Discharge: 2022-03-02 | Disposition: A | Discharge status: Home / Self Care | Payer: BC Managed Care – PPO | Source: Ambulatory Visit | Attending: Student | Admitting: Student

## 2022-03-02 DIAGNOSIS — D329 Benign neoplasm of meninges, unspecified: Secondary | ICD-10-CM | POA: Diagnosis not present

## 2022-03-02 DIAGNOSIS — R220 Localized swelling, mass and lump, head: Secondary | ICD-10-CM | POA: Diagnosis not present

## 2022-03-02 DIAGNOSIS — G932 Benign intracranial hypertension: Secondary | ICD-10-CM | POA: Diagnosis not present

## 2022-03-02 IMAGING — MR MR HEAD WO/W CM
9 of 13 series · 34 of 48 positions shown · IV contrast (gadavist)
Comparison: Prior head CT examinations [DATE] and earlier.
Brain MRI [DATE].

CLINICAL DATA: Provided history: Meningioma.

EXAM:
MRI HEAD WITHOUT AND WITH CONTRAST
TECHNIQUE: Multiplanar, multiecho pulse sequences of the brain and surrounding
structures were obtained without and with intravenous contrast.
CONTRAST:  8mL GADAVIST GADOBUTROL 1 MMOL/ML IV SOLN

[Series 3: DWI · axial · 3.0mm · 1.09mm/px · z∈[-38,+106]mm · 9 of 98 slices shown (1 of 4)]
[im 1/98]
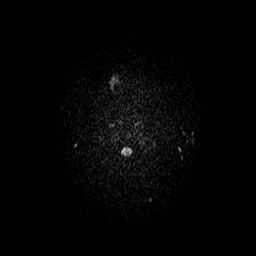
[im 13/98]
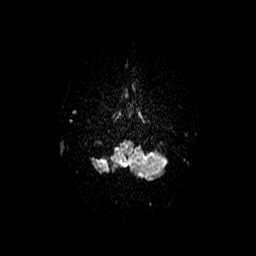
[im 25/98]
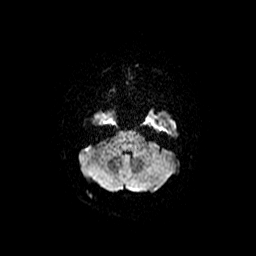
[im 37/98]
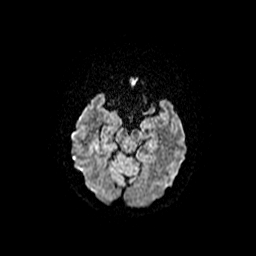
[im 49/98]
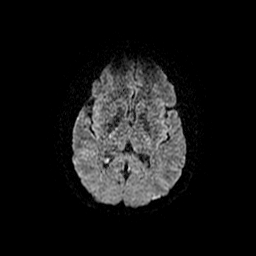
[im 61/98]
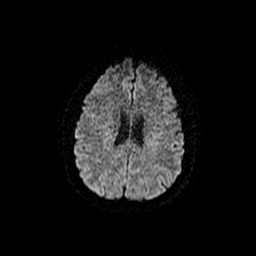
[im 73/98]
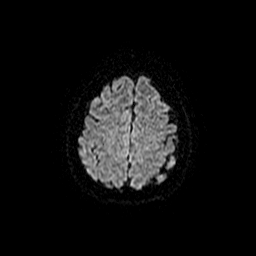
[im 85/98]
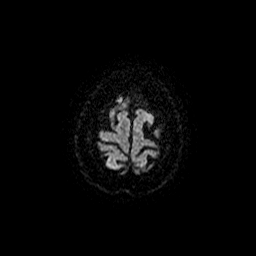
[im 98/98]
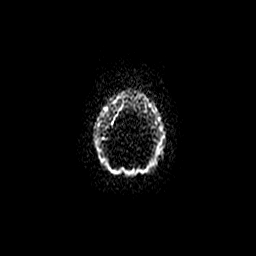

[Series 4: DWI · coronal · 5.0mm · 1.09mm/px · 6 of 72 slices shown (2 of 4)]
[im 1/72]
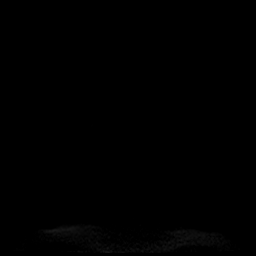
[im 15/72]
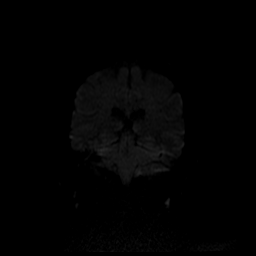
[im 29/72]
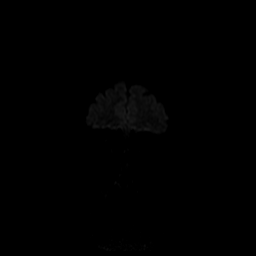
[im 43/72]
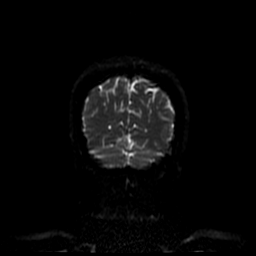
[im 57/72]
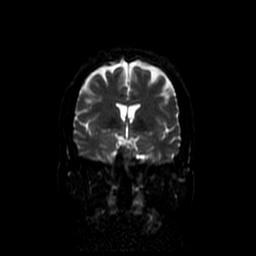
[im 72/72]
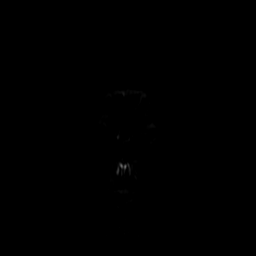

[Series 6: T2 · axial · 5.0mm · 0.45mm/px · z∈[-38,+112]mm · 2 of 26 slices shown]
[im 1/26]
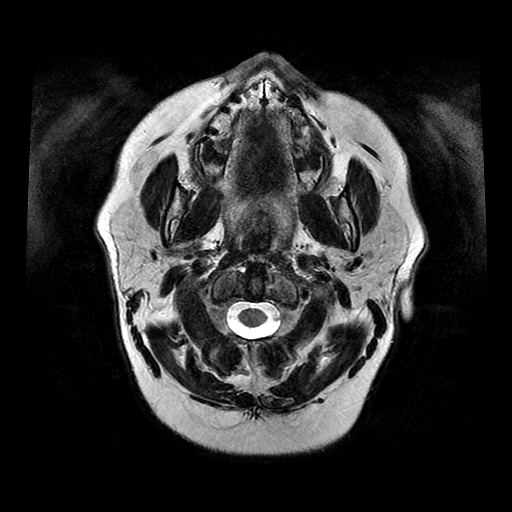
[im 26/26]
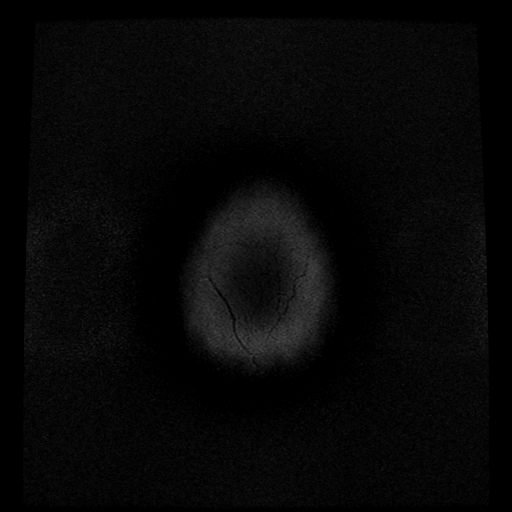

[Series 7: FLAIR · axial · 3.0mm · 0.45mm/px · z∈[-38,+112]mm · 2 of 26 slices shown]
[im 1/26]
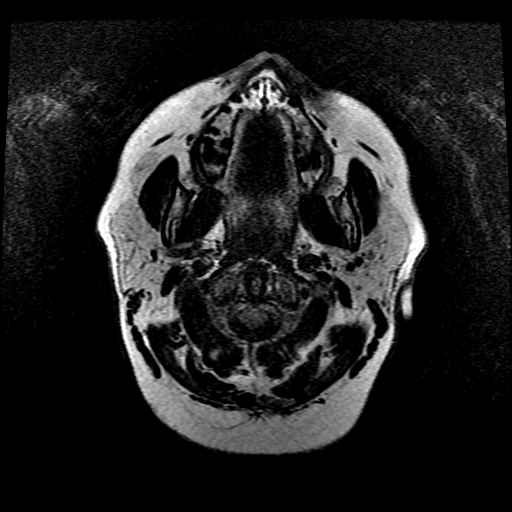
[im 26/26]
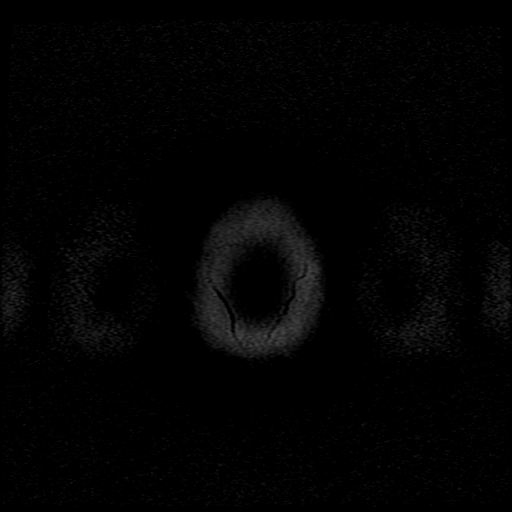

[Series 11: T1 post-contrast · axial · 3.0mm · 0.45mm/px · z∈[-40,+113]mm · 4 of 52 slices shown (1 of 3)]
[im 1/52]
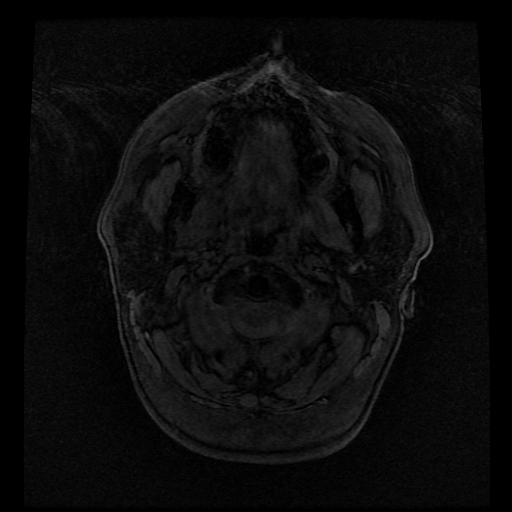
[im 18/52]
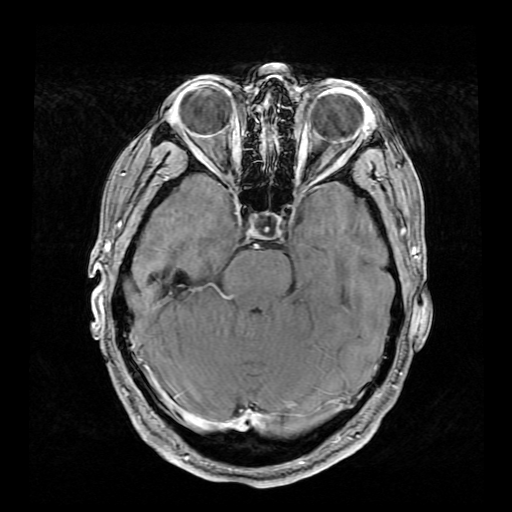
[im 35/52]
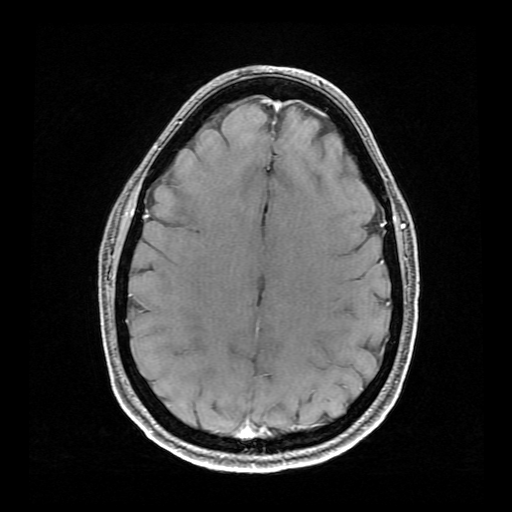
[im 52/52]
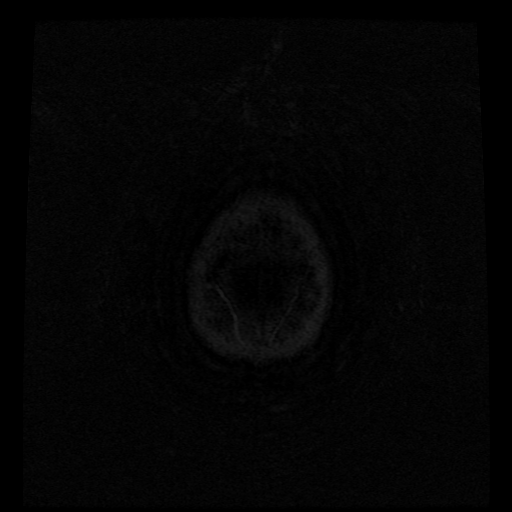

[Series 12: T1 post-contrast · coronal · 5.0mm · 0.39mm/px · 2 of 28 slices shown (2 of 3)]
[im 1/28]
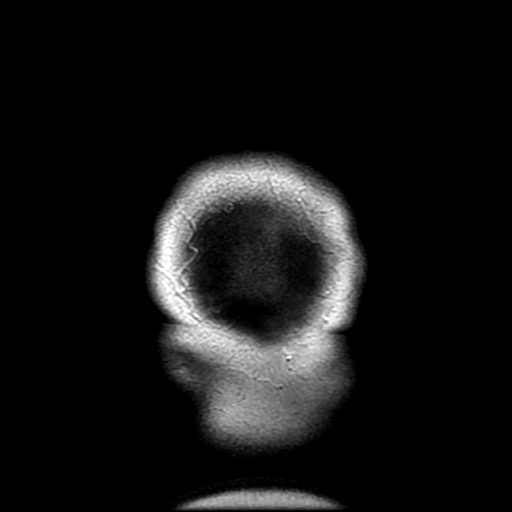
[im 28/28]
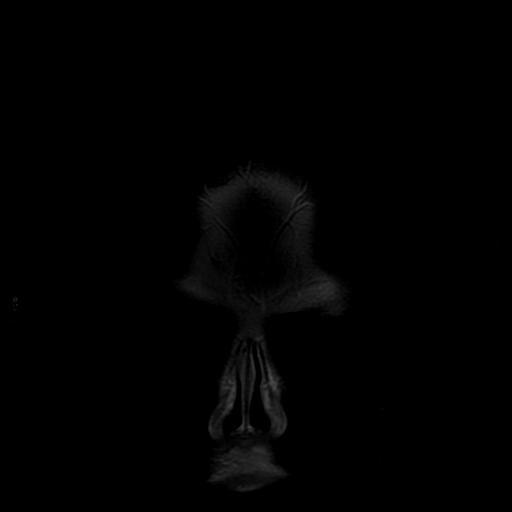

[Series 13: T1 post-contrast · sagittal · 5.0mm · 0.47mm/px · 2 of 25 slices shown (3 of 3)]
[im 1/25]
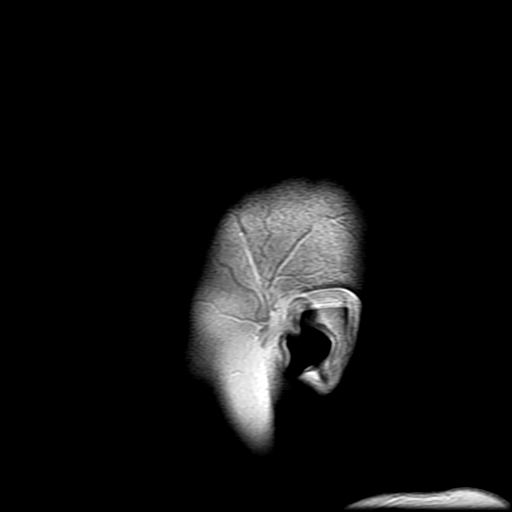
[im 25/25]
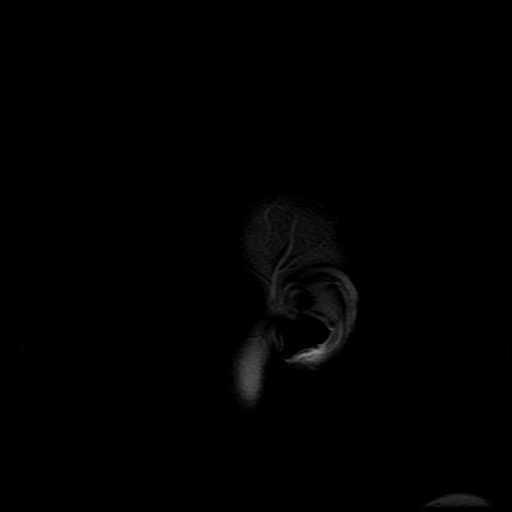

[Series 300: DWI · axial · 3.0mm · 1.09mm/px · z∈[-38,+106]mm · 4 of 49 slices shown (3 of 4)]
[im 1/49]
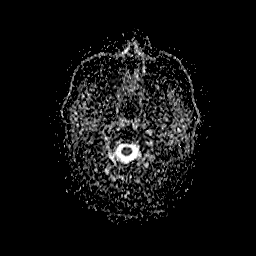
[im 17/49]
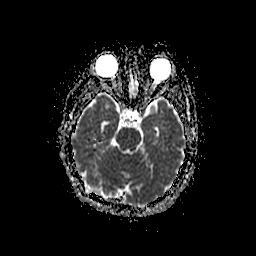
[im 33/49]
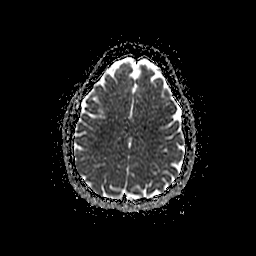
[im 49/49]
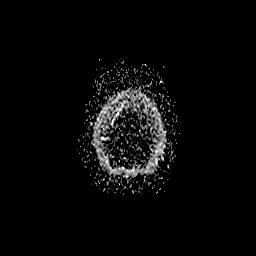

[Series 400: DWI · coronal · 5.0mm · 1.09mm/px · 3 of 36 slices shown (4 of 4)]
[im 1/36]
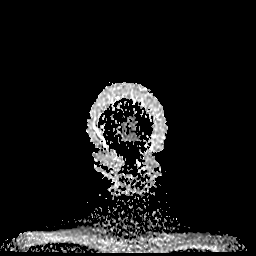
[im 18/36]
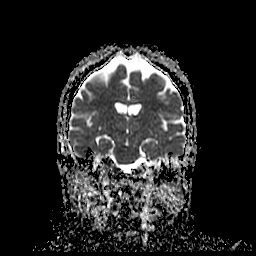
[im 36/36]
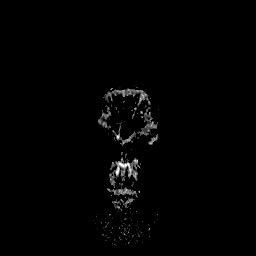

[34 of 48 positions shown; findings below may reference images not displayed]

FINDINGS: Brain:

No age-advanced or lobar predominant parenchymal atrophy.

6 mm enhancing dural-based mass overlying the mid right frontal
lobe. The imaging features are most compatible with a meningioma.
There is contact upon the underlying right frontal lobe without
underlying parenchymal edema.

Partially empty sella turcica.

No cortical encephalomalacia is identified. No significant cerebral
white matter disease.

There is no acute infarct.

No chronic intracranial blood products.

No extra-axial fluid collection.

No midline shift.

Vascular: Maintained flow voids within the proximal large arterial
vessels.

Skull and upper cervical spine: No focal suspicious marrow lesion.

Sinuses/Orbits: Visualized orbits show no acute finding. Mild
mucosal thickening within the bilateral ethmoid and maxillary
sinuses.

Other: Bilateral mastoid effusions.
IMPRESSION: 6 mm enhancing dural-based mass overlying the mid right frontal
lobe, unchanged in size from the recent prior brain MRI of
[DATE]. The imaging features are most compatible with a
meningioma. There is contact upon the underlying right frontal lobe
without underlying parenchymal edema.

Partially empty sella turcica. While this finding often reflects
incidental anatomic variation, it can also be associated with
idiopathic intracranial hypertension (pseudotumor cerebri).

Mild mucosal thickening within the bilateral ethmoid and maxillary
sinuses.

Bilateral mastoid effusions.

## 2022-03-02 MED ORDER — GADOBUTROL 1 MMOL/ML IV SOLN
8.0000 mL | Freq: Once | INTRAVENOUS | Status: AC | PRN
  Administered 2022-03-02: 8 mL via INTRAVENOUS

## 2022-03-03 ENCOUNTER — Ambulatory Visit (HOSPITAL_COMMUNITY)
Admission: RE | Admit: 2022-03-03 | Discharge: 2022-03-03 | Disposition: A | Discharge status: Home / Self Care | Payer: BC Managed Care – PPO | Source: Ambulatory Visit | Attending: Internal Medicine | Admitting: Internal Medicine

## 2022-03-03 DIAGNOSIS — R0602 Shortness of breath: Secondary | ICD-10-CM | POA: Insufficient documentation

## 2022-03-03 LAB — ECHOCARDIOGRAM COMPLETE
AR max vel: 1.71 cm2
AV Area VTI: 1.83 cm2
AV Area mean vel: 1.71 cm2
AV Mean grad: 3 mmHg
AV Peak grad: 5.7 mmHg
Ao pk vel: 1.19 m/s
Area-P 1/2: 3.1 cm2
Calc EF: 66.5 %
MV VTI: 2.54 cm2
S' Lateral: 2.4 cm
Single Plane A2C EF: 64.8 %
Single Plane A4C EF: 67 %

## 2022-03-03 NOTE — Progress Notes (Signed)
*  PRELIMINARY RESULTS* ?Echocardiogram ?2D Echocardiogram has been performed. ? ?Elpidio Anis ?03/03/2022, 2:49 PM ?

## 2022-03-04 DIAGNOSIS — D329 Benign neoplasm of meninges, unspecified: Secondary | ICD-10-CM | POA: Diagnosis not present

## 2022-03-11 NOTE — Telephone Encounter (Signed)
Forms completed by Allison Maize, NP and faxed to [facility] along with office notes. Patient aware, copy placed in scan. ?

## 2022-03-15 ENCOUNTER — Ambulatory Visit: Payer: BC Managed Care – PPO | Admitting: Neurology

## 2022-03-15 VITALS — BP 139/93 | HR 80 | Ht 63.0 in | Wt 181.0 lb

## 2022-03-15 DIAGNOSIS — G259 Extrapyramidal and movement disorder, unspecified: Secondary | ICD-10-CM | POA: Diagnosis not present

## 2022-03-15 DIAGNOSIS — R269 Unspecified abnormalities of gait and mobility: Secondary | ICD-10-CM

## 2022-03-15 DIAGNOSIS — F444 Conversion disorder with motor symptom or deficit: Secondary | ICD-10-CM

## 2022-03-15 NOTE — Patient Instructions (Signed)
I had a long discussion with the patient and her husband regarding multifocal subacute symptoms speech difficulty, tremors, gait difficulty and abnormal jerking of unclear etiology.  Most likely nonorganic functional etiology probably related to underlying anxiety and stress and conversion disorder.  She has had 2 brain MRI scans which have been unremarkable  except for small right frontal 6 mm meningioma which is a incidental and unrelated finding.  He has had stents.  Cardiac, pulmonary neurological and neurosurgical work-up without adequate explanation for her symptoms.  I recommend she see her primary care physician for more optimal treatment of her anxiety and stress.  Consider urgent referral to psychiatrist.  Follow-up with me only if needed and no schedule appointment was made. ?Conversion Disorder ?Conversion disorder is also known as functional neurological symptom disorder. It is a mental (psychiatric) condition in which a person shows symptoms of a problem with the brain, spinal cord, or nerves (nervous system), such as a stroke or seizure. However, these symptoms cannot be explained by a medical condition. This means that the symptoms are real, but the condition is not caused by an injury to the nervous system. ?It is important to note that the symptoms are not faked. Rather, they point to a person's underlying depression, stress, or anxiety. These symptoms are often started (triggered) by a recent or past stressful event. ?Conversion disorder may begin anytime from late childhood through the young adult years. This disorder is more common in females than males. ?What are the causes? ?The exact cause of this disorder is not known. ?What increases the risk? ?You are more likely to develop this condition if: ?You are in a very stressful situation. ?You have a psychiatric condition, such as depression, anxiety, or bipolar disorder. ?You have a personality disorder, such as histrionic personality disorder,  borderline personality disorder, or narcissistic personality disorder. ?You have a history of childhood abuse. ?What are the signs or symptoms? ?Symptoms of this condition may include: ?Muscle weakness or paralysis. If this involves the bladder muscle, it can cause difficulty urinating or loss of bladder control. ?Spells of uncontrollable shaking or spells that look like seizures (pseudoseizures). ?Abnormal movements, such as tremors, jerks, muscle spasms, loss of balance, or difficulty walking. ?Difficulty swallowing, or a feeling of having a lump in the throat. ?Loss of voice (aphonia), stuttering, or a struggle with the muscles of speech (dysarthria). ?Loss of the sensation of touch or pain. ?Changes in vision, such as blindness or double vision. ?Seeing or hearing things that are not there (hallucinations). ?Changes in hearing, including deafness. ?Symptoms usually occur suddenly, often around times of intense stress. ?How is this diagnosed? ?This condition is diagnosed based on an exam by your health care provider. Exams and tests will also be done to make sure you do not have serious physical health problems. These exams and tests will vary depending on your specific symptoms. They may include: ?A physical exam. This involves tests to check for problems with the brain or another part of the nervous system (neurological exam). ?Blood and urine tests. ?X-rays or other imaging studies. ?An electroencephalogram (EEG). This monitors brain waves to look for seizures. ?Your health care provider may refer you to a mental health specialist for more tests. ?How is this treated? ?The main goal of treatment is to get the symptoms to go away as soon as possible. For most people, this condition may be treated with: ?Relaxation techniques. ?Reassurance from the health care provider that the symptoms are stress-related. ?For people with  symptoms that do not go away, treatment options may include: ?Counseling. Acceptance of  the diagnosis is the first step toward successful treatment of the disorder. ?Medicine. Antidepressant medicine may be helpful even if a person with conversion symptoms is not depressed. ?Cognitive behavioral therapy (CBT). This involves discussing stressful life events that may have triggered the symptoms and talking about ways to cope with the stress. ?Physical therapy (PT) and occupational therapy (OT). OT can help to restore strength, and PT can improve walking if your strength and the way that you walk (gait) have been affected. ?Amobarbital interview (rare). This involves discussing stressful life events that may have triggered the symptoms. The mental health specialist asks you questions after you take a short-acting medicine called amobarbital. This medicine puts a person in a hypnotic state. ?Follow these instructions at home: ?Take over-the-counter and prescription medicines only as told by your health care provider. ?Work with your health care provider to identify and reduce stress (use stress management). ?Keep all follow-up visits as told by your health care provider. This is important. Follow-up visits may include counseling or physical therapy sessions. ?Contact a health care provider if: ?Your symptoms do not go away or they become worse. ?You develop new symptoms. ?Get help right away if: ?You have serious thoughts about hurting yourself or someone else. ?If you ever feel like you may hurt yourself or others, or have thoughts about taking your own life, get help right away. You can go to your nearest emergency department or call: ?Your local emergency services (911 in the [place] ?A suicide crisis helpline, such as the [facility] at 986-851-7594 or 988 in the [place]. This is open 24 hours a day. ?Summary ?Conversion disorder is a mental (psychiatric) disorder with symptoms that wrongly suggest an injury to the brain, spinal cord, or nerves (nervous system). ?Symptoms of  conversion disorder may include weakness, numbness, and spells that look like seizures. ?Treatment may involve counseling for stress management, talk therapy, medicines, and physical or occupational therapy. ?If you ever feel like you may hurt yourself or others, or have thoughts about taking your own life, get help right away. ?This information is not intended to replace advice given to you by your health care provider. Make sure you discuss any questions you have with your health care provider. ?Document Revised: 06/10/2021 Document Reviewed: 08/01/2020 ?Elsevier Patient Education ? [place]. ? ?

## 2022-03-15 NOTE — Progress Notes (Signed)
?[facility] ?[address] ?. [place] [address] ?[phone] ? ?     OFFICE CONSULT NOTE ? ?[name] ?Date of Birth:  November 07, 1981 ?Medical Record Number:  [id number]  ? ?Referring MD: Regan Lemming ? ?Reason for Referral: Neurological problem ? ?HPI: [name] is a 41 year old Caucasian lady seen today for initial office consultation visit.  She is accompanied by husband.  History is obtained from them and review of electronic medical records.  I personally reviewed pertinent available imaging films in PACS.  She has past medical history of anxiety, depression, chronic constipation.  Patient states that on February 6 she did notice some speech difficulties as well as others multifocal symptoms of chest tightness and shortness of breath.  She was supplementally saw pulmonologist Dr. Erin Fulling and work-up was unremarkable.  She also saw cardiologist Dr. Glenford Bayley who also did an echocardiogram which was unremarkable BNP was normal.  Patient feels quite frustrated and anxious has no specific onset has been found for symptoms.  She went to the ER on 02/04/1992 and was seen by telemetry neurologist Dr. Daria Pastures for symptoms of facial twitching as well as subjective left-sided weakness.  His note states that symptoms have been going on for more than 3 weeks and a multifocal consisting of shortness of breath, difficulty swallowing taking small bites of food in order to get it down and stuttering speech.  Patient reported that she woke up with some left facial weakness and left leg started giving away and also felt numb.  She had a mild posterior headache for 5 days but they were different from her usual migraines.  MRI scan of the brain was obtained which I personally reviewed showed no acute abnormality.  Small incidental 5 mm right mid frontal convexity meningioma was noted.  Neurological exam was felt to be suspicious for known organicity and psychiatry referral was recommended for  conversion disorder.  Patient in fact subsequently 1 saw neurosurgery who repeated an MRI scan of the brain on 03/02/2022 which showed no significant change from the previous MRI and stable appearance of the small right frontal meningioma.  Patient is also complains of intermittent tremulousness and jerking his which appears to be less when she is distracted.  Patient is able to sleep well and does not have any involuntary movements during her sleep.  She is still able to ambulate though she has to walk slowly as she does not trust her left leg which may give out.  Patient denies any increased recent stress, change in her home situation though she states that her daughter was in the 20s has moved in with them and has 3 grandchildren which are mostly looked after by the patient.  She does have prior history of depression with suicidal attempt as a teenager and she has not seen a psychiatrist for a long time.  She is currently on Prozac 40 mg every other day and the dose has not been recently increased .  Patient denies any prior history of strokes, TIAs, seizures and significant neurological problems.  No family history of strokes in a young age. ?ROS:   ?14 system review of systems is positive for speech difficulty, involuntary movements, gait difficulty, leg weakness, shortness of breath, chest tightness all other systems negative ? ?PMH:  ?Past Medical History:  ?Diagnosis Date  ? Anxiety   ? Constipation   ? Depression   ? ? ?Social History:  ?Social History  ? ?Socioeconomic History  ? Marital status: Single  ?  Spouse name: Not on file  ? Number of children: Not on file  ? Years of education: Not on file  ? Highest education level: Not on file  ?Occupational History  ? Not on file  ?Tobacco Use  ? Smoking status: Former  ?  Packs/day: 0.25  ?  Types: Cigarettes  ?  Quit date: 08/2021  ?  Years since quitting: 0.5  ? Smokeless tobacco: Never  ?Vaping Use  ? Vaping Use: Former  ?Substance and Sexual Activity  ?  Alcohol use: No  ? Drug use: No  ? Sexual activity: Yes  ?  Birth control/protection: None  ?Other Topics Concern  ? Not on file  ?Social History Narrative  ? Not on file  ? ?Social Determinants of Health  ? ?Financial Resource Strain: Not on file  ?Food Insecurity: Not on file  ?Transportation Needs: Not on file  ?Physical Activity: Not on file  ?Stress: Not on file  ?Social Connections: Not on file  ?Intimate Partner Violence: Not on file  ? ? ?Medications:   ?Current Outpatient Medications on File Prior to Visit  ?Medication Sig Dispense Refill  ? albuterol (VENTOLIN HFA) 108 (90 Base) MCG/ACT inhaler Inhale 1-2 puffs into the lungs every 6 (six) hours as needed for wheezing or shortness of breath.    ? FLUoxetine (PROZAC) 40 MG capsule Take 40 mg by mouth every other day.    ? fluticasone-salmeterol (ADVAIR HFA) 230-21 MCG/ACT inhaler Inhale 2 puffs into the lungs 2 (two) times daily. 12 g 12  ? ipratropium-albuterol (DUONEB) 0.5-2.5 (3) MG/3ML SOLN Take 3 mLs by nebulization every 6 (six) hours as needed (sob/wheezing).    ? ?No current facility-administered medications on file prior to visit.  ? ? ?Allergies:  No Known Allergies ? ?Physical Exam ?General: Mildly obese young Caucasian lady, seated, in no evident distress ?Head: head normocephalic and atraumatic.   ?Neck: supple with no carotid or supraclavicular bruits ?Cardiovascular: regular rate and rhythm, no murmurs ?Musculoskeletal: no deformity ?Skin:  no rash/petichiae ?Vascular:  Normal pulses all extremities ? ?Neurologic Exam ?Mental Status: Awake and fully alert. Oriented to place and time. Recent and remote memory intact. Attention span, concentration and fund of knowledge appropriate. Mood and affect appropriate.  Speech is bizarre at times.  And clear occasionally with word finding difficulties and apparent anxiety while talking.  Good comprehension, naming and repetition. ?Cranial Nerves: Fundoscopic exam reveals sharp disc margins. Pupils  equal, briskly reactive to light. Extraocular movements full without nystagmus. Visual fields full to confrontation. Hearing intact. Facial sensation intact. Face, tongue, palate moves normally and symmetrically.  ?Motor: Normal bulk and tone. Normal strength in all tested extremity muscles.  But favors the left leg while walking with great effort but able to stand on either foot but needs to hold on for balance.  Intermittent coarse tremulousness of the head, jaw, all 4 extremities more prominent on the left than the right but decreased with distraction.   Intermittent twitching's on the left nasolabial fold which also involved at times the left palpebral fissure and these appear diminished with distraction. ?Sensory.: intact to touch , pinprick , position and vibratory sensation.  ?Coordination: Rapid alternating movements normal in all extremities. Finger-to-nose and heel-to-shin performed accurately bilaterally. ?Gait and Station: Arises from chair without difficulty. Stance is normal. Gait is bizarre with stiffness of the left leg and favoring it while walking but can walk unassisted though very carefully measured gait  ?reflexes: 1+ and symmetric. Toes downgoing.  ? ?  ? ? ?  ASSESSMENT: 41 year old Caucasian lady with 2-1/2 months old with history of multifocal pain or shortness of breath, chest tightness speech difficulties, tremors gait difficulty with negative cardiac, pulmonary and neuro imaging work-up of unclear etiology.  Neurological exam shows bizarre speech, involuntary movements and gait with nonorganic features. Probably conversion disorder due to underlying anxiety/stress. ? ? ? ? ? ?PLAN:I had a long discussion with the patient and her husband regarding multifocal subacute symptoms speech difficulty, tremors, gait difficulty and abnormal jerking of unclear etiology.  Most likely nonorganic functional etiology probably related to underlying anxiety and stress and conversion disorder.  She has had 2  brain MRI scans which have been unremarkable  except for small right frontal 6 mm meningioma which is a incidental and unrelated finding.  He has had stents.  Cardiac, pulmonary neurological and neurosurgica

## 2022-03-19 ENCOUNTER — Ambulatory Visit: Payer: BC Managed Care – PPO | Admitting: Family Medicine

## 2022-03-19 ENCOUNTER — Encounter: Payer: Self-pay | Admitting: Family Medicine

## 2022-03-19 VITALS — BP 137/90 | HR 105 | Temp 97.8°F | Resp 20 | Ht 63.0 in | Wt 179.0 lb

## 2022-03-19 DIAGNOSIS — F449 Dissociative and conversion disorder, unspecified: Secondary | ICD-10-CM

## 2022-03-19 MED ORDER — TIZANIDINE HCL 4 MG PO TABS: 4.0000 mg | ORAL_TABLET | Freq: Three times a day (TID) | ORAL | 2 refills | Status: DC | PRN

## 2022-03-19 MED ORDER — FLUOXETINE HCL 40 MG PO CAPS: 40.0000 mg | ORAL_CAPSULE | Freq: Every day | ORAL | 1 refills | Status: DC

## 2022-03-19 NOTE — Patient Instructions (Signed)
Prozac 40 mg daily. ? ?Start Tizanidine to relax muscles and get to sleep. ?

## 2022-03-19 NOTE — Progress Notes (Signed)
? ?Assessment & Plan:  ?1. Conversion disorder ?Increase Prozac to 40 mg daily from every other day. Try tizanidine to calm muscles so that she can sleep. STAT referral to psychiatry. If tizanidine is not effective - possibly try Cogentin or Requip. Education provided on conversion disorder.  ?- tiZANidine (ZANAFLEX) 4 MG tablet; Take 1 tablet (4 mg total) by mouth every 8 (eight) hours as needed for muscle spasms.  Dispense: 90 tablet; Refill: 2 ?- FLUoxetine (PROZAC) 40 MG capsule; Take 1 capsule (40 mg total) by mouth daily.  Dispense: 90 capsule; Refill: 1 ?- Ambulatory referral to Psychiatry ? ? ?Return in about 4 weeks (around 04/16/2022) for follow-up of chronic medication conditions. ? ?[name], MSN, APRN, FNP-C ?[place] ? ?Subjective:  ? ? Patient ID: Allison Galloway, female    DOB: 20-Jan-1981, 41 y.o.   MRN: [id number] ? ?Patient Care Team: ?Loman Brooklyn, FNP as PCP - General (Family Medicine)  ? ?Chief Complaint:  ?Chief Complaint  ?Patient presents with  ? Medical Management of Chronic Issues  ? ? ?HPI: ?Allison Galloway is a 41 y.o. female presenting on 03/19/2022 for Medical Management of Chronic Issues ? ?Patient is accompanied by her daughter who she is okay with being present. ? ?Patient has been seen by pulmonology, cardiology, and neurology due to speech difficulties, chest tightness, shortness of breath, facial twitching, left-sided weakness, and gait difficulty.  Her work-up has not shown any cause for her symptoms.  Neurology suspects she has conversion disorder.  She reports prior to the onset of all of the symptoms she had been caring for her aunt.  She does recognize that this was causing an increased amount of stress and flooding back memories of taking care of her Lillia Abed, who raised her.  Patient was working at [employer] prior to the onset of the symptoms.  She is currently unable to work as she is physically unable to do her job due to her symptoms.   She reports normal activities completely drain her.  She gives the example of feeling exhausted after just cooking dinner.  She is not able to sleep as she reports her body just will not relax.  She has tried taking melatonin 20 mg which was not helpful at all.  She enjoys photography and reports her parents have bought her a tripod and accessories that allow her to still photograph with her current symptoms.  She has also about to start horse therapy.  She is currently taking Prozac 40 mg every other day which she reports has been helpful, as she feels she would be worse without it. ? ? ?  03/19/2022  ?  2:08 PM 12/10/2021  ?  1:29 PM  ?Depression screen PHQ 2/9  ?Decreased Interest 2 1  ?Down, Depressed, Hopeless 3 2  ?PHQ - 2 Score 5 3  ?Altered sleeping 3 1  ?Tired, decreased energy 3 3  ?Change in appetite 2 1  ?Feeling bad or failure about yourself  3 1  ?Trouble concentrating 1 2  ?Moving slowly or fidgety/restless 3 3  ?Suicidal thoughts 0 0  ?PHQ-9 Score 20 14  ?Difficult doing work/chores Extremely dIfficult Somewhat difficult  ? ? ?  03/19/2022  ?  2:08 PM 12/10/2021  ?  1:30 PM  ?GAD 7 : Generalized Anxiety Score  ?Nervous, Anxious, on Edge 1 2  ?Control/stop worrying 1 1  ?Worry too much - different things 2 2  ?Trouble relaxing 3 2  ?Restless  3 0  ?Easily annoyed or irritable 3 1  ?Afraid - awful might happen 0 0  ?Total GAD 7 Score 13 8  ?Anxiety Difficulty Very difficult Somewhat difficult  ? ? ?New complaints: ?None ? ? ?Social history: ? ?Relevant past medical, surgical, family and social history reviewed and updated as indicated. Interim medical history since our last visit reviewed. ? ?Allergies and medications reviewed and updated. ? ?DATA REVIEWED: CHART IN EPIC ? ?ROS: Negative unless specifically indicated above in HPI.  ? ? ?Current Outpatient Medications:  ?  albuterol (VENTOLIN HFA) 108 (90 Base) MCG/ACT inhaler, Inhale 1-2 puffs into the lungs every 6 (six) hours as needed for wheezing or  shortness of breath., Disp: , Rfl:  ?  FLUoxetine (PROZAC) 40 MG capsule, Take 40 mg by mouth every other day., Disp: , Rfl:  ?  fluticasone-salmeterol (ADVAIR HFA) 230-21 MCG/ACT inhaler, Inhale 2 puffs into the lungs 2 (two) times daily., Disp: 12 g, Rfl: 12 ?  ipratropium-albuterol (DUONEB) 0.5-2.5 (3) MG/3ML SOLN, Take 3 mLs by nebulization every 6 (six) hours as needed (sob/wheezing)., Disp: , Rfl:   ? ?No Known Allergies ?Past Medical History:  ?Diagnosis Date  ? Anxiety   ? Constipation   ? Depression   ?  ?Past Surgical History:  ?Procedure Laterality Date  ? ARTHROSCOPIC REPAIR ACL    ? CESAREAN SECTION    ? KNEE SURGERY    ? TUBAL LIGATION    ?  ?Social History  ? ?Socioeconomic History  ? Marital status: Single  ?  Spouse name: Not on file  ? Number of children: Not on file  ? Years of education: Not on file  ? Highest education level: Not on file  ?Occupational History  ? Not on file  ?Tobacco Use  ? Smoking status: Former  ?  Packs/day: 0.25  ?  Types: Cigarettes  ?  Quit date: 08/2021  ?  Years since quitting: 0.5  ? Smokeless tobacco: Never  ?Vaping Use  ? Vaping Use: Former  ?Substance and Sexual Activity  ? Alcohol use: No  ? Drug use: No  ? Sexual activity: Yes  ?  Birth control/protection: None  ?Other Topics Concern  ? Not on file  ?Social History Narrative  ? Not on file  ? ?Social Determinants of Health  ? ?Financial Resource Strain: Not on file  ?Food Insecurity: Not on file  ?Transportation Needs: Not on file  ?Physical Activity: Not on file  ?Stress: Not on file  ?Social Connections: Not on file  ?Intimate Partner Violence: Not on file  ?  ? ?   ?Objective:  ?  ?BP 137/90   Pulse (!) 105   Temp 97.8 ?F (36.6 ?C)   Resp 20   Ht '5\' 3"'$  (1.6 m)   Wt 179 lb (81.2 kg)   LMP 03/12/2022 (Exact Date)   SpO2 97%   BMI 31.71 kg/m?  ? ?Wt Readings from Last 3 Encounters:  ?03/19/22 179 lb (81.2 kg)  ?03/15/22 181 lb (82.1 kg)  ?02/26/22 181 lb (82.1 kg)  ? ? ?Physical Exam ?Vitals reviewed.   ?Constitutional:   ?   General: She is not in acute distress. ?   Appearance: Normal appearance. She is not ill-appearing, toxic-appearing or diaphoretic.  ?HENT:  ?   Head: Normocephalic and atraumatic.  ?Eyes:  ?   General: No scleral icterus.    ?   Right eye: No discharge.     ?   Left eye: No  discharge.  ?   Conjunctiva/sclera: Conjunctivae normal.  ?Cardiovascular:  ?   Rate and Rhythm: Normal rate.  ?Pulmonary:  ?   Effort: Pulmonary effort is normal. No respiratory distress.  ?Musculoskeletal:     ?   General: Normal range of motion.  ?   Cervical back: Normal range of motion.  ?Skin: ?   General: Skin is warm and dry.  ?   Capillary Refill: Capillary refill takes less than 2 seconds.  ?Neurological:  ?   Mental Status: She is alert and oriented to person, place, and time. Mental status is at baseline.  ?   Gait: Gait abnormal.  ?   Comments: Twitching.  ?Psychiatric:     ?   Mood and Affect: Mood normal.     ?   Behavior: Behavior normal.     ?   Thought Content: Thought content normal.     ?   Judgment: Judgment normal.  ?   Comments: Speech impaired.  ? ? ?Lab Results  ?Component Value Date  ? TSH 1.40 01/21/2022  ? ?Lab Results  ?Component Value Date  ? WBC 7.8 02/02/2022  ? HGB 13.3 02/02/2022  ? HCT 40.2 02/02/2022  ? MCV 84.5 02/02/2022  ? PLT 230 02/02/2022  ? ?Lab Results  ?Component Value Date  ? NA 137 02/02/2022  ? K 3.7 02/02/2022  ? CO2 23 02/02/2022  ? GLUCOSE 131 (H) 02/02/2022  ? BUN 11 02/02/2022  ? CREATININE 0.95 02/02/2022  ? BILITOT 0.4 02/02/2022  ? ALKPHOS 52 02/02/2022  ? AST 20 02/02/2022  ? ALT 26 02/02/2022  ? PROT 7.1 02/02/2022  ? ALBUMIN 3.7 02/02/2022  ? CALCIUM 8.9 02/02/2022  ? ANIONGAP 8 02/02/2022  ? GFR 69.24 01/21/2022  ? ?No results found for: CHOL ?No results found for: HDL ?No results found for: [place] ?No results found for: TRIG ?No results found for: CHOLHDL ?No results found for: HGBA1C ? ?   ? ? ? ? ?

## 2022-03-23 ENCOUNTER — Telehealth: Payer: Self-pay | Admitting: Family Medicine

## 2022-03-23 NOTE — Telephone Encounter (Signed)
Pt called to let PCP know that the place we sent her referral only sees pts for drug and alcohol. Says referral needs to be sent somewhere else. ? ?Wants to know if we can send her referral to [facility].. Phone # [phone] ?

## 2022-03-29 NOTE — Telephone Encounter (Signed)
Does that place have a psychiatrist? She will need someone to manage medications as well as counseling.  ?

## 2022-04-07 ENCOUNTER — Ambulatory Visit (INDEPENDENT_AMBULATORY_CARE_PROVIDER_SITE_OTHER): Payer: BC Managed Care – PPO | Admitting: Gastroenterology

## 2022-04-07 ENCOUNTER — Encounter: Payer: Self-pay | Admitting: Gastroenterology

## 2022-04-07 VITALS — BP 158/90 | HR 81 | Temp 97.5°F | Ht 63.0 in | Wt 180.0 lb

## 2022-04-07 DIAGNOSIS — K5909 Other constipation: Secondary | ICD-10-CM

## 2022-04-07 MED ORDER — TRULANCE 3 MG PO TABS: 3.0000 mg | ORAL_TABLET | Freq: Every day | ORAL | 5 refills | Status: DC

## 2022-04-07 MED ORDER — MAGNESIUM CITRATE PO SOLN: 1.0000 | Freq: Once | ORAL | 0 refills | Status: AC

## 2022-04-07 NOTE — Patient Instructions (Addendum)
Start Trulance '3mg'$  daily. We may have to do a prior authorization to get approved. RX sent to your drug store and we will complete any necessary paper work.  ?Samples of Trulance provided.  ?If you want, you can take a mini colon cleanse with Magnesium Citrate to help jump start your stools. Drink one bottle and plenty of liquids to maintain hydration.  ?Increase dietary fiber.  ?Try to drink 90 ounces of water daily. ?Call if your bowels do not improve. ?Return to the office in 3 months for follow up.  ? ?Constipation, Adult ?Constipation is when a person has fewer than three bowel movements in a week, has difficulty having a bowel movement, or has stools (feces) that are dry, hard, or larger than normal. Constipation may be caused by an underlying condition. It may become worse with age if a person takes certain medicines and does not take in enough fluids. ?Follow these instructions at home: ?Eating and drinking ? ?Eat foods that have a lot of fiber, such as beans, whole grains, and fresh fruits and vegetables. ?Limit foods that are low in fiber and high in fat and processed sugars, such as fried or sweet foods. These include french fries, hamburgers, cookies, candies, and soda. ?Drink enough fluid to keep your urine pale yellow. ?General instructions ?Exercise regularly or as told by your health care provider. Try to do 150 minutes of moderate exercise each week. ?Use the bathroom when you have the urge to go. Do not hold it in. ?Take over-the-counter and prescription medicines only as told by your health care provider. This includes any fiber supplements. ?During bowel movements: ?Practice deep breathing while relaxing the lower abdomen. ?Practice pelvic floor relaxation. ?Watch your condition for any changes. Let your health care provider know about them. ?Keep all follow-up visits as told by your health care provider. This is important. ?Contact a health care provider if: ?You have pain that gets worse. ?You  have a fever. ?You do not have a bowel movement after 4 days. ?You vomit. ?You are not hungry or you lose weight. ?You are bleeding from the opening between the buttocks (anus). ?You have thin, pencil-like stools. ?Get help right away if: ?You have a fever and your symptoms suddenly get worse. ?You leak stool or have blood in your stool. ?Your abdomen is bloated. ?You have severe pain in your abdomen. ?You feel dizzy or you faint. ?Summary ?Constipation is when a person has fewer than three bowel movements in a week, has difficulty having a bowel movement, or has stools (feces) that are dry, hard, or larger than normal. ?Eat foods that have a lot of fiber, such as beans, whole grains, and fresh fruits and vegetables. ?Drink enough fluid to keep your urine pale yellow. ?Take over-the-counter and prescription medicines only as told by your health care provider. This includes any fiber supplements. ?This information is not intended to replace advice given to you by your health care provider. Make sure you discuss any questions you have with your health care provider. ?Document Revised: 10/03/2019 Document Reviewed: 10/03/2019 ?Elsevier Patient Education ? [place]. ? ? ? ?High-Fiber Eating Plan ?Fiber, also called dietary fiber, is a type of carbohydrate. It is found foods such as fruits, vegetables, whole grains, and beans. A high-fiber diet can have many health benefits. Your health care provider may recommend a high-fiber diet to help: ?Prevent constipation. Fiber can make your bowel movements more regular. ?Lower your cholesterol. ?Relieve the following conditions: ?Inflammation of veins  in the anus (hemorrhoids). ?Inflammation of specific areas of the digestive tract (uncomplicated diverticulosis). ?A problem of the large intestine, also called the colon, that sometimes causes pain and diarrhea (irritable bowel syndrome, or IBS). ?Prevent overeating as part of a weight-loss plan. ?Prevent heart disease,  type 2 diabetes, and certain cancers. ?What are tips for following this plan? ?Reading food labels ? ?Check the nutrition facts label on food products for the amount of dietary fiber. Choose foods that have 5 grams of fiber or more per serving. ?The goals for recommended daily fiber intake include: ?Men (age 79 or younger): 34-38 g. ?Men (over age 17): 28-34 g. ?Women (age 54 or younger): 25-28 g. ?Women (over age 95): 22-25 g. ?Your daily fiber goal is _____________ g. ?Shopping ?Choose whole fruits and vegetables instead of processed forms, such as apple juice or applesauce. ?Choose a wide variety of high-fiber foods such as avocados, lentils, oats, and kidney beans. ?Read the nutrition facts label of the foods you choose. Be aware of foods with added fiber. These foods often have high sugar and sodium amounts per serving. ?Cooking ?Use whole-grain flour for baking and cooking. ?Cook with brown rice instead of white rice. ?Meal planning ?Start the day with a breakfast that is high in fiber, such as a cereal that contains 5 g of fiber or more per serving. ?Eat breads and cereals that are made with whole-grain flour instead of refined flour or white flour. ?Eat brown rice, bulgur wheat, or millet instead of white rice. ?Use beans in place of meat in soups, salads, and pasta dishes. ?Be sure that half of the grains you eat each day are whole grains. ?General information ?You can get the recommended daily intake of dietary fiber by: ?Eating a variety of fruits, vegetables, grains, nuts, and beans. ?Taking a fiber supplement if you are not able to take in enough fiber in your diet. It is better to get fiber through food than from a supplement. ?Gradually increase how much fiber you consume. If you increase your intake of dietary fiber too quickly, you may have bloating, cramping, or gas. ?Drink plenty of water to help you digest fiber. ?Choose high-fiber snacks, such as berries, raw vegetables, nuts, and popcorn. ?What  foods should I eat? ?Fruits ?Berries. Pears. Apples. Oranges. Avocado. Prunes and raisins. Dried figs. ?Vegetables ?Sweet potatoes. Spinach. Kale. Artichokes. Cabbage. Broccoli. Cauliflower. Green peas. Carrots. Squash. ?Grains ?Whole-grain breads. Multigrain cereal. Oats and oatmeal. Brown rice. Barley. Bulgur wheat. [place]. Quinoa. Bran muffins. Popcorn. Rye wafer crackers. ?Meats and other proteins ?Navy beans, kidney beans, and pinto beans. Soybeans. Split peas. Lentils. Nuts and seeds. ?Dairy ?Fiber-fortified yogurt. ?Beverages ?Fiber-fortified soy milk. Fiber-fortified orange juice. ?Other foods ?Fiber bars. ?The items listed above may not be a complete list of recommended foods and beverages. Contact a dietitian for more information. ?What foods should I avoid? ?Fruits ?Fruit juice. Cooked, strained fruit. ?Vegetables ?Fried potatoes. Canned vegetables. Well-cooked vegetables. ?Grains ?White bread. Pasta made with refined flour. White rice. ?Meats and other proteins ?Fatty cuts of meat. Fried chicken or fried fish. ?Dairy ?Milk. Yogurt. Cream cheese. Sour cream. ?Fats and oils ?Butters. ?Beverages ?Soft drinks. ?Other foods ?Cakes and pastries. ?The items listed above may not be a complete list of foods and beverages to avoid. Talk with your dietitian about what choices are best for you. ?Summary ?Fiber is a type of carbohydrate. It is found in foods such as fruits, vegetables, whole grains, and beans. ?A high-fiber diet has many benefits.  It can help to prevent constipation, lower blood cholesterol, aid weight loss, and reduce your risk of heart disease, diabetes, and certain cancers. ?Increase your intake of fiber gradually. Increasing fiber too quickly may cause cramping, bloating, and gas. Drink plenty of water while you increase the amount of fiber you consume. ?The best sources of fiber include whole fruits and vegetables, whole grains, nuts, seeds, and beans. ?This information is not intended to  replace advice given to you by your health care provider. Make sure you discuss any questions you have with your health care provider. ?Document Revised: 03/20/2020 Document Reviewed: 03/20/2020 ?Elsevier P

## 2022-04-07 NOTE — Progress Notes (Signed)
? ? ? ?GI Office Note   ? ?Referring Provider: Loman Brooklyn, FNP ?Primary Care Physician:  Loman Brooklyn, FNP  ?Primary Gastroenterologist: Elon Alas. Abbey Chatters, DO ? ? ?Chief Complaint  ? ?Chief Complaint  ?Patient presents with  ? Constipation  ?  Last bm was a week ago and it was hard and she had to strain.   ? ? ? ?History of Present Illness  ? ?Allison Galloway is a 41 y.o. female presenting today at the request of [name] FNP for further evaluation of constipation.  ? ?Patient states she has had constipation ever since her first pregnancy, her youngest child is 30 years old.  She states she is tried every over-the-counter regimen.  Nothing has ever helped.  She goes a week without a BM.  In January her PCP started her on Amitiza 47mg bid, started 11/2021.  To 3 days of medication before she had her first BM.  She was on the medication for approximately 1 month and felt like she was doing reasonably well with regards to her constipation.  She subsequently came off the medication because she developed multitude of unexplained symptoms. ? ?On Feb 6 she noted speech difficulties as well as chest tightness and SOB.  Over the course of several weeks she developed additional symptoms.  She has had left facial weakness/twitching and left leg weakness, stuttering speech. Has seen pulmonology and cardiology with unremarkable work up. Neurosurgery/neurology work up as well. 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  Numerous labs also gained without etiology for symptoms.  See below.  Ultimately it is felt that she has conversion disorder due to underlying anxiety/stress.  Patient states she continues to have involuntary movements of her face, arms.  She has weakness dominantly the left side, lower extremity.  Speech continues to be abnormal with significant stuttering.  At times her swallowing is affected by this as well.  She finds it very hard to  sleep because her body relax.  She is no longer able to work or drive.  This has worsened her anxiety and depression.  She has had medication adjustments, stating she initially started on Prozac in January when she felt like her anxiety and depression was worse.  States her dose has been increased over the past few months.  She has an initial visit with a psychiatrist soon. ? ?From a GI standpoint, she has a bowel movement about once per week.  Associated with hard stools, straining.  Complains of lower abdominal pain related to constipation.  She has nausea but no vomiting.  No melena or rectal bleeding. ? ?   ?Medications  ? ?Current Outpatient Medications  ?Medication Sig Dispense Refill  ? albuterol (VENTOLIN HFA) 108 (90 Base) MCG/ACT inhaler Inhale 1-2 puffs into the lungs every 6 (six) hours as needed for wheezing or shortness of breath.    ? FLUoxetine (PROZAC) 40 MG capsule Take 1 capsule (40 mg total) by mouth daily. 90 capsule 1  ? fluticasone-salmeterol (ADVAIR HFA) 230-21 MCG/ACT inhaler Inhale 2 puffs into the lungs 2 (two) times daily. 12 g 12  ? ipratropium-albuterol (DUONEB) 0.5-2.5 (3) MG/3ML SOLN Take 3 mLs by nebulization every 6 (six) hours as needed (sob/wheezing).    ? tiZANidine (ZANAFLEX) 4 MG tablet Take 1 tablet (4 mg total) by mouth every 8 (eight) hours as needed for muscle spasms. 90 tablet 2  ? ?No current facility-administered medications for this visit.  ? ? ?  Allergies  ? ?Allergies as of 04/07/2022  ? (No Known Allergies)  ? ? ?Past Medical History  ? ?Past Medical History:  ?Diagnosis Date  ? Anxiety   ? Constipation   ? Depression   ? ? ?Past Surgical History  ? ?Past Surgical History:  ?Procedure Laterality Date  ? ARTHROSCOPIC REPAIR ACL    ? CESAREAN SECTION    ? 3  ? KNEE SURGERY    ? TUBAL LIGATION    ? ? ?Past Family History  ? ?Family History  ?Problem Relation Age of Onset  ? Diabetes Mother   ? Depression Mother   ? Arthritis Mother   ? Asthma Mother   ? Learning  disabilities Mother   ? Mental illness Mother   ? Depression Sister   ? Asthma Sister   ? Mental illness Sister   ? Seizures Daughter   ? Hypertension Other   ? Bipolar disorder Other   ? Anxiety disorder Other   ? Depression Other   ? ? ?Past Social History  ? ?Social History  ? ?Socioeconomic History  ? Marital status: Single  ?  Spouse name: Not on file  ? Number of children: Not on file  ? Years of education: Not on file  ? Highest education level: Not on file  ?Occupational History  ? Not on file  ?Tobacco Use  ? Smoking status: Former  ?  Packs/day: 0.25  ?  Types: Cigarettes  ?  Quit date: 08/2021  ?  Years since quitting: 0.6  ? Smokeless tobacco: Never  ?Vaping Use  ? Vaping Use: Former  ?Substance and Sexual Activity  ? Alcohol use: No  ? Drug use: No  ? Sexual activity: Yes  ?  Birth control/protection: None  ?Other Topics Concern  ? Not on file  ?Social History Narrative  ? Not on file  ? ?Social Determinants of Health  ? ?Financial Resource Strain: Not on file  ?Food Insecurity: Not on file  ?Transportation Needs: Not on file  ?Physical Activity: Not on file  ?Stress: Not on file  ?Social Connections: Not on file  ?Intimate Partner Violence: Not on file  ? ? ?Review of Systems  ? ?General: Negative for anorexia, weight loss, fever, chills, fatigue, weakness. ?Eyes: Negative for vision changes.  ?ENT: Negative for hoarseness, difficulty swallowing , nasal congestion. ?CV: Negative for chest pain, angina, palpitations, dyspnea on exertion, peripheral edema.  See HPI ?Respiratory: Negative for dyspnea at rest, dyspnea on exertion, cough, sputum, wheezing.  See HPI ?GI: See history of present illness. ?GU:  Negative for dysuria, hematuria, urinary incontinence, urinary frequency, nocturnal urination.  ?MS: Negative for joint pain, low back pain.  ?Derm: Negative for rash or itching.  ?Neuro: Negative for weakness, seizure, frequent headaches, memory loss,  ?confusion.  See HPI ?Psych: Negative for  suicidal  ideation, hallucinations.  See HPI ?Endo: Negative for unusual weight change.  ?Heme: Negative for bruising or bleeding. ?Allergy: Negative for rash or hives. ? ?Physical Exam  ? ?BP (!) 158/90 (BP Location: Right Arm, Patient Position: Sitting, Cuff Size: Normal)   Pulse 81   Temp (!) 97.5 ?F (36.4 ?C) (Temporal)   Ht '5\' 3"'$  (1.6 m)   Wt 180 lb (81.6 kg)   LMP 03/12/2022 (Exact Date)   SpO2 98%   BMI 31.89 kg/m?  ?  ?General: Well-nourished, well-developed in no acute distress.  She appears frustrated.  Speech is stuttered, repetitious.  Sometimes repeats the entire word 2-3 times for  the next word started.  Facial twitching noted.  Tic-like motion of the right arm which is repetitive. ?Head: Normocephalic, atraumatic.   ?Eyes: Conjunctiva pink, no icterus. ?Mouth: Oropharyngeal mucosa moist and pink , no lesions erythema or exudate. ?Neck: Supple without thyromegaly, masses, or lymphadenopathy.  ?Lungs: Clear to auscultation bilaterally.  ?Heart: Regular rate and rhythm, no murmurs rubs or gallops.  ?Abdomen: Bowel sounds are normal, nontender, nondistended, no hepatosplenomegaly or masses,  ?no abdominal bruits or hernia, no rebound or guarding.   ?Rectal: Formed ?Extremities: No lower extremity edema. No clubbing or deformities.  ?Neuro: Alert and oriented x 4 , grossly normal neurologically.  See general ?Skin: Warm and dry, no rash or jaundice.   ?Psych: Alert and cooperative, depressed mood and affect. ? ?Labs  ? ?Lab Results  ?Component Value Date  ? CREATININE 0.95 02/02/2022  ? BUN 11 02/02/2022  ? NA 137 02/02/2022  ? K 3.7 02/02/2022  ? CL 106 02/02/2022  ? CO2 23 02/02/2022  ? ?Lab Results  ?Component Value Date  ? ALT 26 02/02/2022  ? AST 20 02/02/2022  ? ALKPHOS 52 02/02/2022  ? BILITOT 0.4 02/02/2022  ? ?Lab Results  ?Component Value Date  ? WBC 7.8 02/02/2022  ? HGB 13.3 02/02/2022  ? HCT 40.2 02/02/2022  ? MCV 84.5 02/02/2022  ? PLT 230 02/02/2022  ? ?Lab Results  ?Component Value Date  ? TSH  1.40 01/21/2022  ? ?Lab Results  ?Component Value Date  ? INR 0.9 02/02/2022  ? INR 1.0 01/30/2022  ? ? ? ?Imaging Studies  ? ?No results found. ? ?Assessment  ? ?Chronic constipation: Poorly managed.  Reports fai

## 2022-04-08 DIAGNOSIS — F411 Generalized anxiety disorder: Secondary | ICD-10-CM | POA: Diagnosis not present

## 2022-04-13 ENCOUNTER — Encounter (INDEPENDENT_AMBULATORY_CARE_PROVIDER_SITE_OTHER): Payer: Self-pay

## 2022-04-15 ENCOUNTER — Telehealth: Payer: Self-pay

## 2022-04-15 NOTE — Telephone Encounter (Signed)
PA for Trulance 3 mg tablets has been approved. The approval letter will be scanned into patient's chart.

## 2022-04-20 ENCOUNTER — Ambulatory Visit: Payer: BC Managed Care – PPO | Admitting: Family Medicine

## 2022-04-20 ENCOUNTER — Encounter: Payer: Self-pay | Admitting: Family Medicine

## 2022-04-20 VITALS — BP 112/77 | HR 70 | Temp 98.2°F | Ht 63.0 in | Wt 181.8 lb

## 2022-04-20 DIAGNOSIS — F419 Anxiety disorder, unspecified: Secondary | ICD-10-CM

## 2022-04-20 DIAGNOSIS — K5909 Other constipation: Secondary | ICD-10-CM | POA: Diagnosis not present

## 2022-04-20 DIAGNOSIS — F331 Major depressive disorder, recurrent, moderate: Secondary | ICD-10-CM | POA: Diagnosis not present

## 2022-04-20 DIAGNOSIS — F449 Dissociative and conversion disorder, unspecified: Secondary | ICD-10-CM | POA: Diagnosis not present

## 2022-04-20 NOTE — Progress Notes (Signed)
Assessment & Plan:  1-3. Anxiety/Moderate episode of recurrent major depressive disorder (HCC)/Conversion disorder Managed by psychiatry.  Encourage patient to go ahead and apply for disability.  She is unable to physically work due to her symptoms, normal activity completely drains her, she is unable to sleep.  4. Chronic constipation Well controlled on current regimen.  Managed by gastroenterology.   Return in about 6 months (around 10/21/2022) for annual physical w. pap.  [name], MSN, APRN, FNP-C [name]  Subjective:    Patient ID: Allison Galloway, female    DOB: 11-20-1981, 41 y.o.   MRN: [id number]  Patient Care Team: Loman Brooklyn, FNP as PCP - General (Family Medicine)   Chief Complaint:  Chief Complaint  Patient presents with   Medical Management of Chronic Issues    4 week chronic follow up     HPI: Allison Galloway is a 41 y.o. female presenting on 04/20/2022 for Medical Management of Chronic Issues (4 week chronic follow up )  Depression/anxiety/conversion disorder: Now managed by psychiatry.  Patient does report since medications were changed she is not jerking and shaking as much as previously, but it does get worse if she feels overwhelmed or stressed out.  She is scheduled to follow-up 06/03/2022.     04/20/2022    4:38 PM 03/19/2022    2:08 PM 12/10/2021    1:29 PM  Depression screen PHQ 2/9  Decreased Interest '1 2 1  '$ Down, Depressed, Hopeless '1 3 2  '$ PHQ - 2 Score '2 5 3  '$ Altered sleeping '1 3 1  '$ Tired, decreased energy '3 3 3  '$ Change in appetite '2 2 1  '$ Feeling bad or failure about yourself  '1 3 1  '$ Trouble concentrating '2 1 2  '$ Moving slowly or fidgety/restless '3 3 3  '$ Suicidal thoughts 0 0 0  PHQ-9 Score '14 20 14  '$ Difficult doing work/chores Very difficult Extremely dIfficult Somewhat difficult      04/20/2022    4:38 PM 03/19/2022    2:08 PM 12/10/2021    1:30 PM  GAD 7 : Generalized Anxiety Score  Nervous, Anxious, on Edge  '3 1 2  '$ Control/stop worrying '1 1 1  '$ Worry too much - different things '3 2 2  '$ Trouble relaxing '1 3 2  '$ Restless 0 3 0  Easily annoyed or irritable '3 3 1  '$ Afraid - awful might happen 0 0 0  Total GAD 7 Score '11 13 8  '$ Anxiety Difficulty Very difficult Very difficult Somewhat difficult   Constipation: Taking Trulance daily, which is prescribed by gastroenterology.  New complaints: None   Social history:  Relevant past medical, surgical, family and social history reviewed and updated as indicated. Interim medical history since our last visit reviewed.  Allergies and medications reviewed and updated.  DATA REVIEWED: CHART IN EPIC  ROS: Negative unless specifically indicated above in HPI.    Current Outpatient Medications:    albuterol (VENTOLIN HFA) 108 (90 Base) MCG/ACT inhaler, Inhale 1-2 puffs into the lungs every 6 (six) hours as needed for wheezing or shortness of breath., Disp: , Rfl:    fluticasone-salmeterol (ADVAIR HFA) 230-21 MCG/ACT inhaler, Inhale 2 puffs into the lungs 2 (two) times daily., Disp: 12 g, Rfl: 12   hydrOXYzine (VISTARIL) 50 MG capsule, Take 50 mg by mouth 3 (three) times daily., Disp: , Rfl:    ipratropium-albuterol (DUONEB) 0.5-2.5 (3) MG/3ML SOLN, Take 3 mLs by nebulization every 6 (six) hours as needed (sob/wheezing)., Disp: ,  Rfl:    Plecanatide (TRULANCE) 3 MG TABS, Take 3 mg by mouth daily., Disp: 30 tablet, Rfl: 5   tiZANidine (ZANAFLEX) 4 MG tablet, Take 1 tablet (4 mg total) by mouth every 8 (eight) hours as needed for muscle spasms., Disp: 90 tablet, Rfl: 2   traZODone (DESYREL) 100 MG tablet, Take 50 mg by mouth at bedtime., Disp: , Rfl:    FLUoxetine (PROZAC) 40 MG capsule, Take 1 capsule (40 mg total) by mouth daily. (Patient not taking: Reported on 04/20/2022), Disp: 90 capsule, Rfl: 1   No Known Allergies Past Medical History:  Diagnosis Date   Anxiety    Constipation    Depression     Past Surgical History:  Procedure Laterality Date    ARTHROSCOPIC REPAIR ACL     CESAREAN SECTION     3   KNEE SURGERY     TUBAL LIGATION      Social History   Socioeconomic History   Marital status: Single    Spouse name: Not on file   Number of children: Not on file   Years of education: Not on file   Highest education level: Not on file  Occupational History   Not on file  Tobacco Use   Smoking status: Former    Packs/day: 0.25    Types: Cigarettes    Quit date: 08/2021    Years since quitting: 0.6   Smokeless tobacco: Never  Vaping Use   Vaping Use: Former  Substance and Sexual Activity   Alcohol use: No   Drug use: No   Sexual activity: Yes    Birth control/protection: None  Other Topics Concern   Not on file  Social History Narrative   Not on file   Social Determinants of Health   Financial Resource Strain: Not on file  Food Insecurity: Not on file  Transportation Needs: Not on file  Physical Activity: Not on file  Stress: Not on file  Social Connections: Not on file  Intimate Partner Violence: Not on file        Objective:    BP 112/77   Pulse 70   Temp 98.2 F (36.8 C) (Temporal)   Ht '5\' 3"'$  (1.6 m)   Wt 181 lb 12.8 oz (82.5 kg)   SpO2 92%   BMI 32.20 kg/m   Wt Readings from Last 3 Encounters:  04/20/22 181 lb 12.8 oz (82.5 kg)  04/07/22 180 lb (81.6 kg)  03/19/22 179 lb (81.2 kg)    Physical Exam Vitals reviewed.  Constitutional:      General: She is not in acute distress.    Appearance: Normal appearance. She is obese. She is not ill-appearing, toxic-appearing or diaphoretic.  HENT:     Head: Normocephalic and atraumatic.  Eyes:     General: No scleral icterus.       Right eye: No discharge.        Left eye: No discharge.     Conjunctiva/sclera: Conjunctivae normal.  Cardiovascular:     Rate and Rhythm: Normal rate and regular rhythm.     Heart sounds: Normal heart sounds. No murmur heard.   No friction rub. No gallop.  Pulmonary:     Effort: Pulmonary effort is normal. No  respiratory distress.     Breath sounds: Normal breath sounds. No stridor. No wheezing, rhonchi or rales.  Musculoskeletal:        General: Normal range of motion.     Cervical back: Normal range of motion.  Skin:    General: Skin is warm and dry.     Capillary Refill: Capillary refill takes less than 2 seconds.  Neurological:     General: No focal deficit present.     Mental Status: She is alert and oriented to person, place, and time. Mental status is at baseline.     Gait: Gait abnormal.     Comments: Twitching.  Psychiatric:        Mood and Affect: Mood normal.        Behavior: Behavior normal.        Thought Content: Thought content normal.        Judgment: Judgment normal.     Comments: Speech impaired.    Lab Results  Component Value Date   TSH 1.40 01/21/2022   Lab Results  Component Value Date   WBC 7.8 02/02/2022   HGB 13.3 02/02/2022   HCT 40.2 02/02/2022   MCV 84.5 02/02/2022   PLT 230 02/02/2022   Lab Results  Component Value Date   NA 137 02/02/2022   K 3.7 02/02/2022   CO2 23 02/02/2022   GLUCOSE 131 (H) 02/02/2022   BUN 11 02/02/2022   CREATININE 0.95 02/02/2022   BILITOT 0.4 02/02/2022   ALKPHOS 52 02/02/2022   AST 20 02/02/2022   ALT 26 02/02/2022   PROT 7.1 02/02/2022   ALBUMIN 3.7 02/02/2022   CALCIUM 8.9 02/02/2022   ANIONGAP 8 02/02/2022   GFR 69.24 01/21/2022   No results found for: CHOL No results found for: HDL No results found for: LDLCALC No results found for: TRIG No results found for: CHOLHDL No results found for: HGBA1C

## 2022-04-21 ENCOUNTER — Other Ambulatory Visit: Payer: Self-pay | Admitting: Family Medicine

## 2022-04-21 DIAGNOSIS — Z1231 Encounter for screening mammogram for malignant neoplasm of breast: Secondary | ICD-10-CM

## 2022-04-26 ENCOUNTER — Encounter: Payer: Self-pay | Admitting: Family Medicine

## 2022-04-26 DIAGNOSIS — F449 Dissociative and conversion disorder, unspecified: Secondary | ICD-10-CM | POA: Insufficient documentation

## 2022-05-05 ENCOUNTER — Ambulatory Visit
Admission: RE | Admit: 2022-05-05 | Discharge: 2022-05-05 | Disposition: A | Discharge status: Home / Self Care | Payer: BC Managed Care – PPO | Source: Ambulatory Visit | Attending: Family Medicine | Admitting: Family Medicine

## 2022-05-05 DIAGNOSIS — Z1231 Encounter for screening mammogram for malignant neoplasm of breast: Secondary | ICD-10-CM | POA: Diagnosis not present

## 2022-05-05 IMAGING — MG MM DIGITAL SCREENING BILAT W/ TOMO AND CAD
8 series · 8 of 24 positions shown · non-contrast
Comparison: None

CLINICAL DATA: Screening. Baseline examination.

EXAM:
DIGITAL SCREENING BILATERAL MAMMOGRAM WITH TOMOSYNTHESIS AND CAD
TECHNIQUE: Bilateral screening digital craniocaudal and mediolateral oblique
mammograms were obtained. Bilateral screening digital breast
tomosynthesis was performed. The images were evaluated with
computer-aided detection.

[R CC synth-2D]
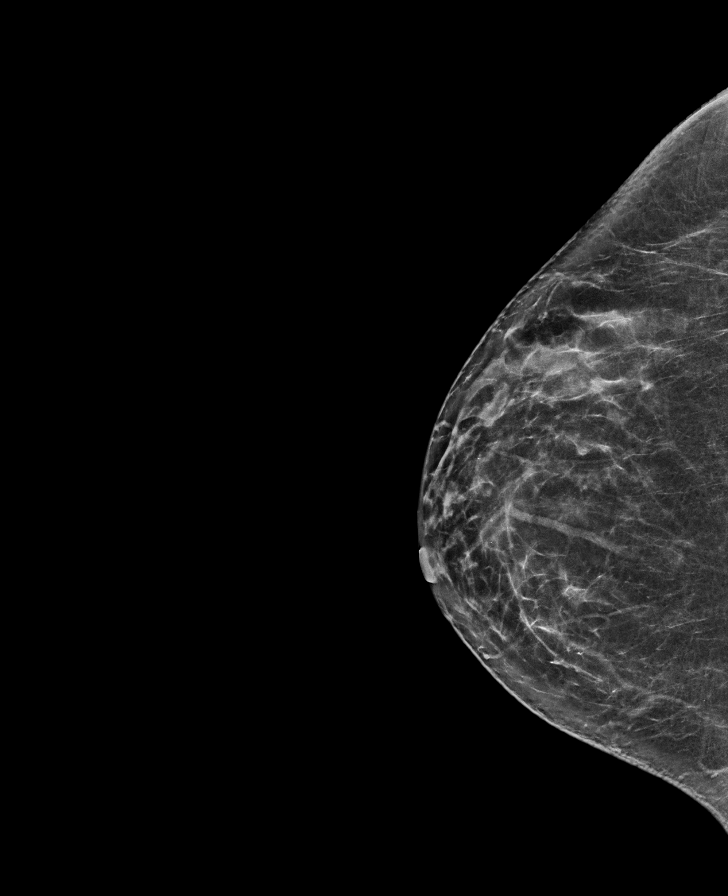

[L MLO synth-2D]
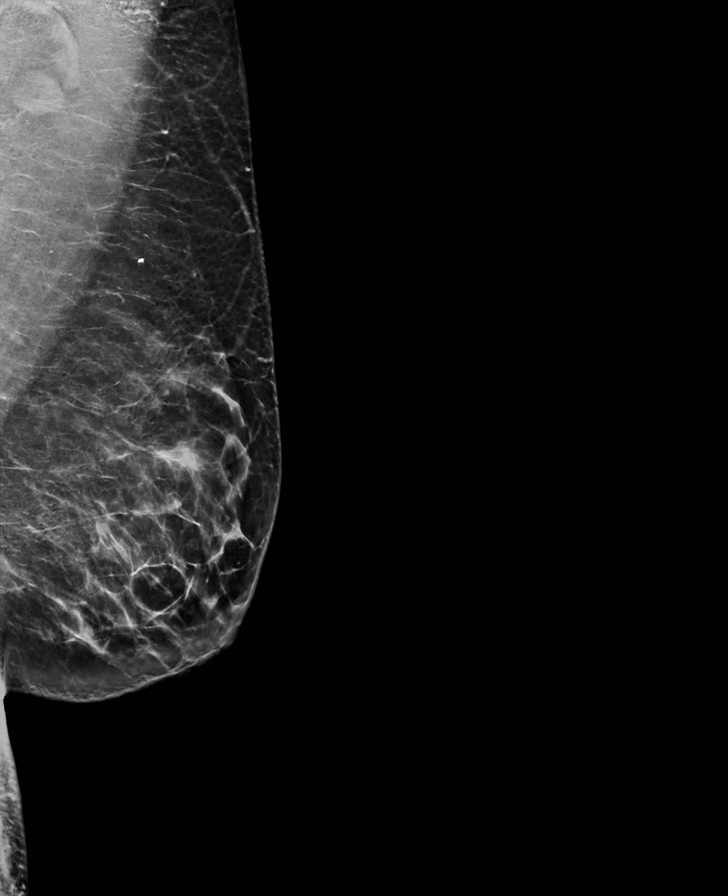

[L CC synth-2D]
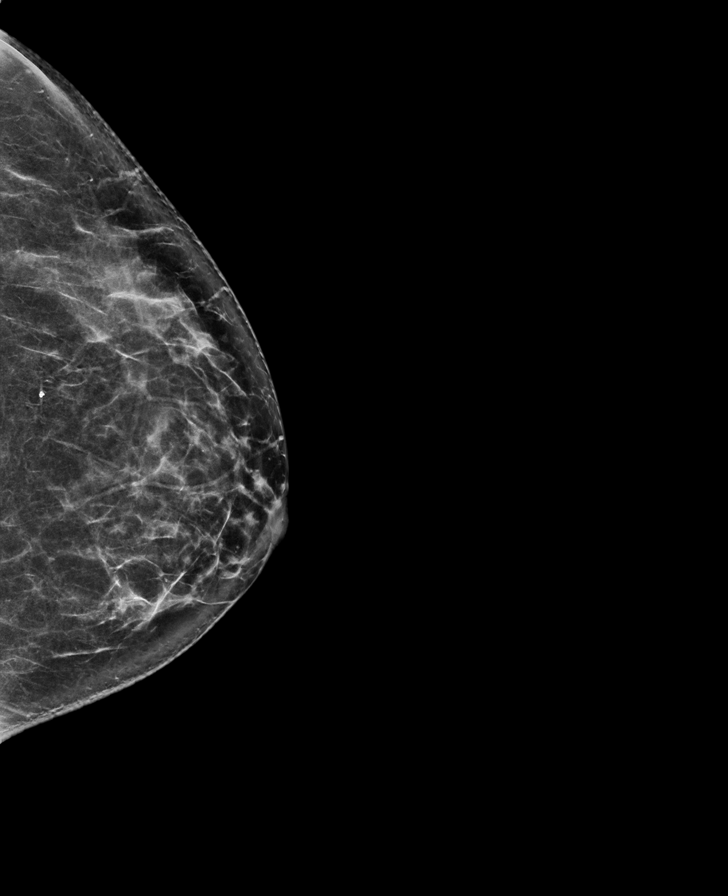

[R MLO synth-2D]
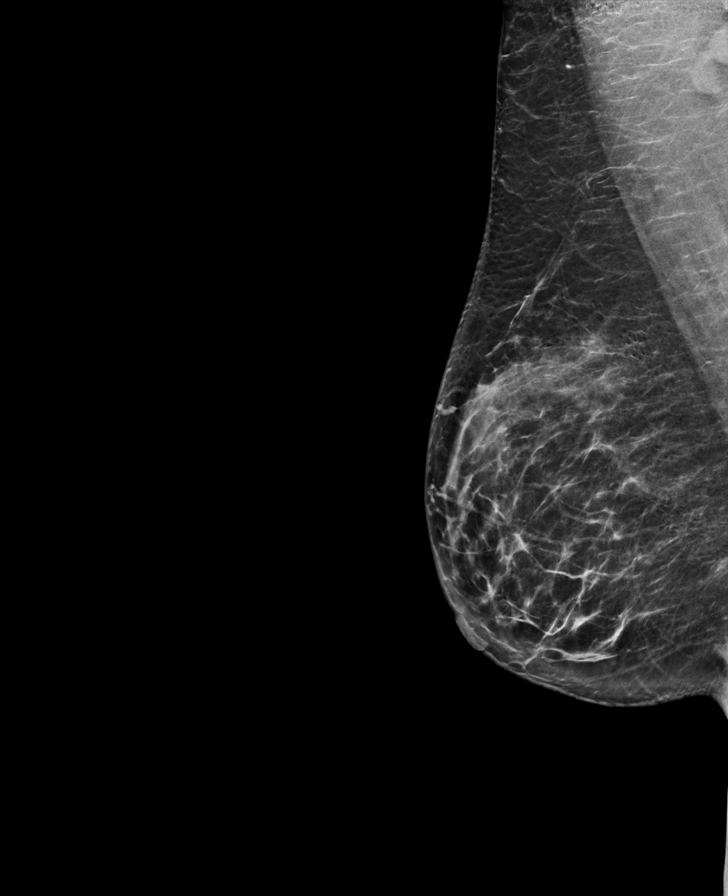

[R MLO tomo · tomo slice 39/78.0]
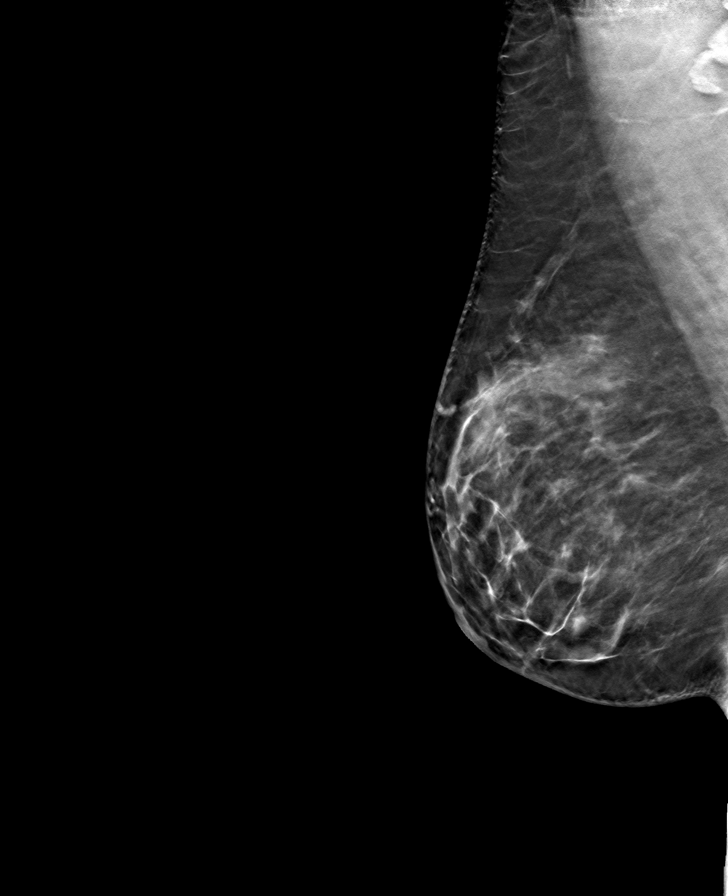

[L CC tomo · tomo slice 38/75.0]
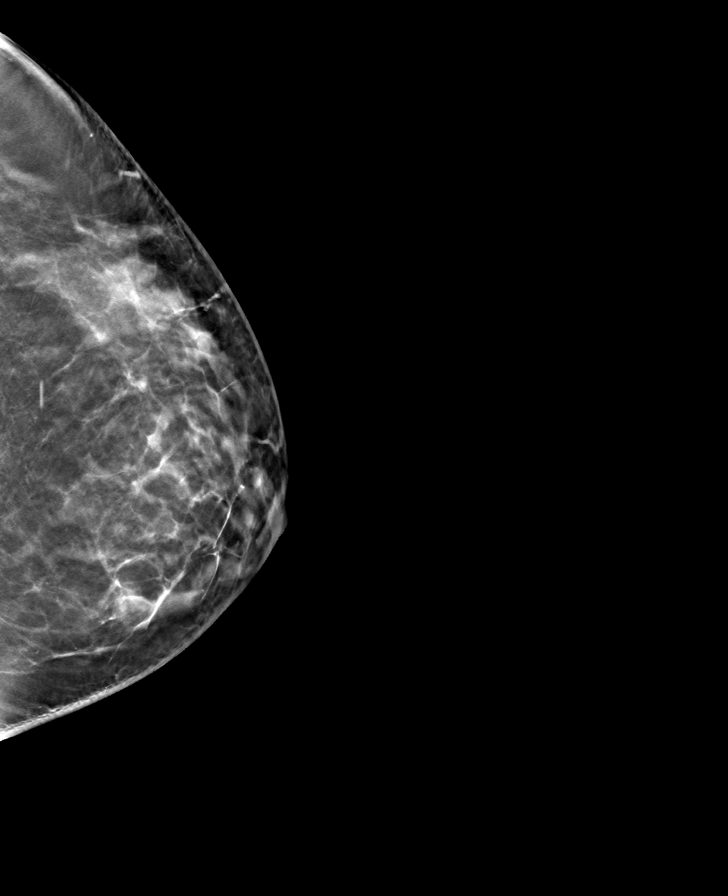

[L MLO tomo · tomo slice 43/84.0]
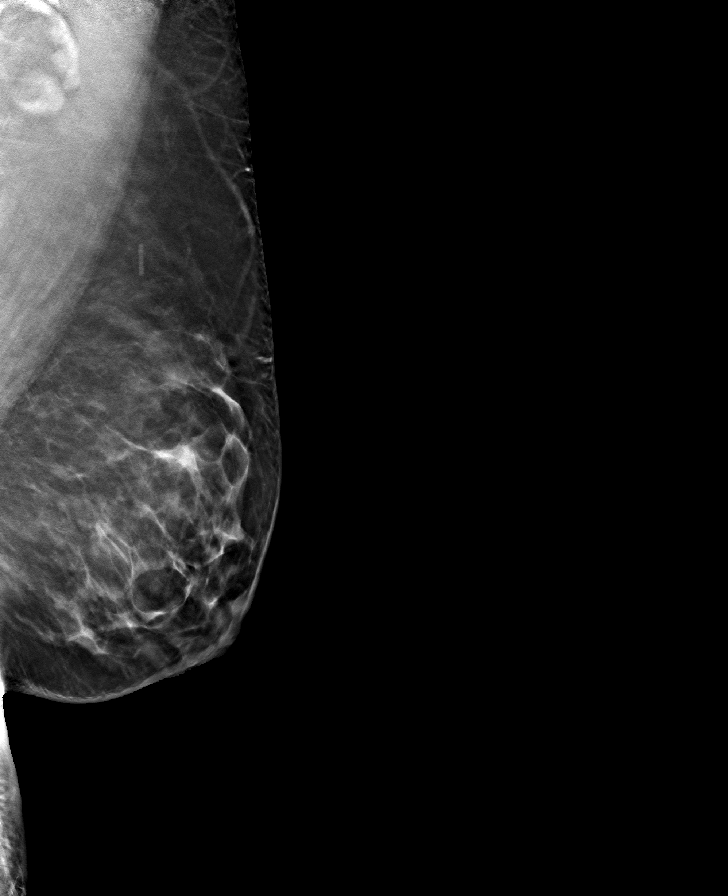

[R CC tomo · tomo slice 37/72.0]
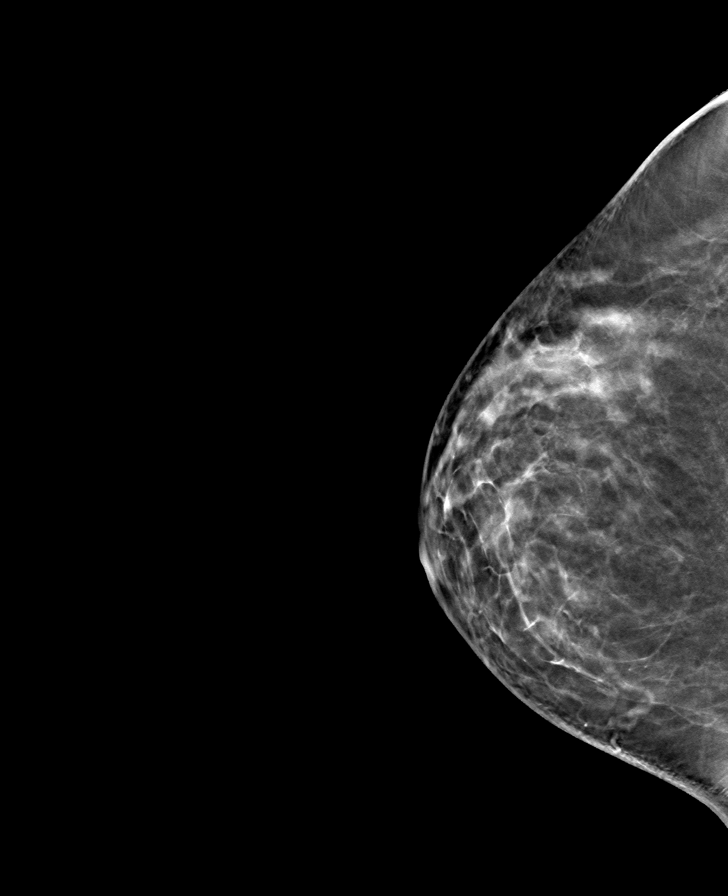

[8 of 24 positions shown; findings below may reference images not displayed]

ACR Breast Density Category b: There are scattered areas of
fibroglandular density.
FINDINGS: In the left breast, a possible asymmetry warrants further
evaluation. In the right breast, no findings suspicious for
malignancy.
IMPRESSION: Further evaluation is suggested for possible asymmetry in the left
breast.

RECOMMENDATION:
Diagnostic mammogram and possibly ultrasound of the left breast.
(Code:[0N])

The patient will be contacted regarding the findings, and additional
imaging will be scheduled.

BI-RADS CATEGORY  0: Incomplete. Need additional imaging evaluation
and/or prior mammograms for comparison.

## 2022-05-07 ENCOUNTER — Other Ambulatory Visit: Payer: Self-pay | Admitting: Family Medicine

## 2022-05-07 DIAGNOSIS — R928 Other abnormal and inconclusive findings on diagnostic imaging of breast: Secondary | ICD-10-CM

## 2022-05-13 ENCOUNTER — Ambulatory Visit
Admission: RE | Admit: 2022-05-13 | Discharge: 2022-05-13 | Disposition: A | Discharge status: Home / Self Care | Payer: BC Managed Care – PPO | Source: Ambulatory Visit | Attending: Family Medicine | Admitting: Family Medicine

## 2022-05-13 ENCOUNTER — Ambulatory Visit: Admission: RE | Admit: 2022-05-13 | Payer: BC Managed Care – PPO | Source: Ambulatory Visit

## 2022-05-13 DIAGNOSIS — R928 Other abnormal and inconclusive findings on diagnostic imaging of breast: Secondary | ICD-10-CM | POA: Diagnosis not present

## 2022-05-13 IMAGING — MG MM DIGITAL DIAGNOSTIC UNILAT*L* W/ TOMO W/ CAD
4 series · 4 of 12 positions shown · non-contrast
Comparison: Previous exam(s).

CLINICAL DATA: 40-year-old female presenting as a recall from
screening for possible left breast asymmetry.

EXAM:
DIGITAL DIAGNOSTIC UNILATERAL LEFT MAMMOGRAM WITH TOMOSYNTHESIS AND
CAD
TECHNIQUE: Left digital diagnostic mammography and breast tomosynthesis was
performed. The images were evaluated with computer-aided detection.

[L ML synth-2D]
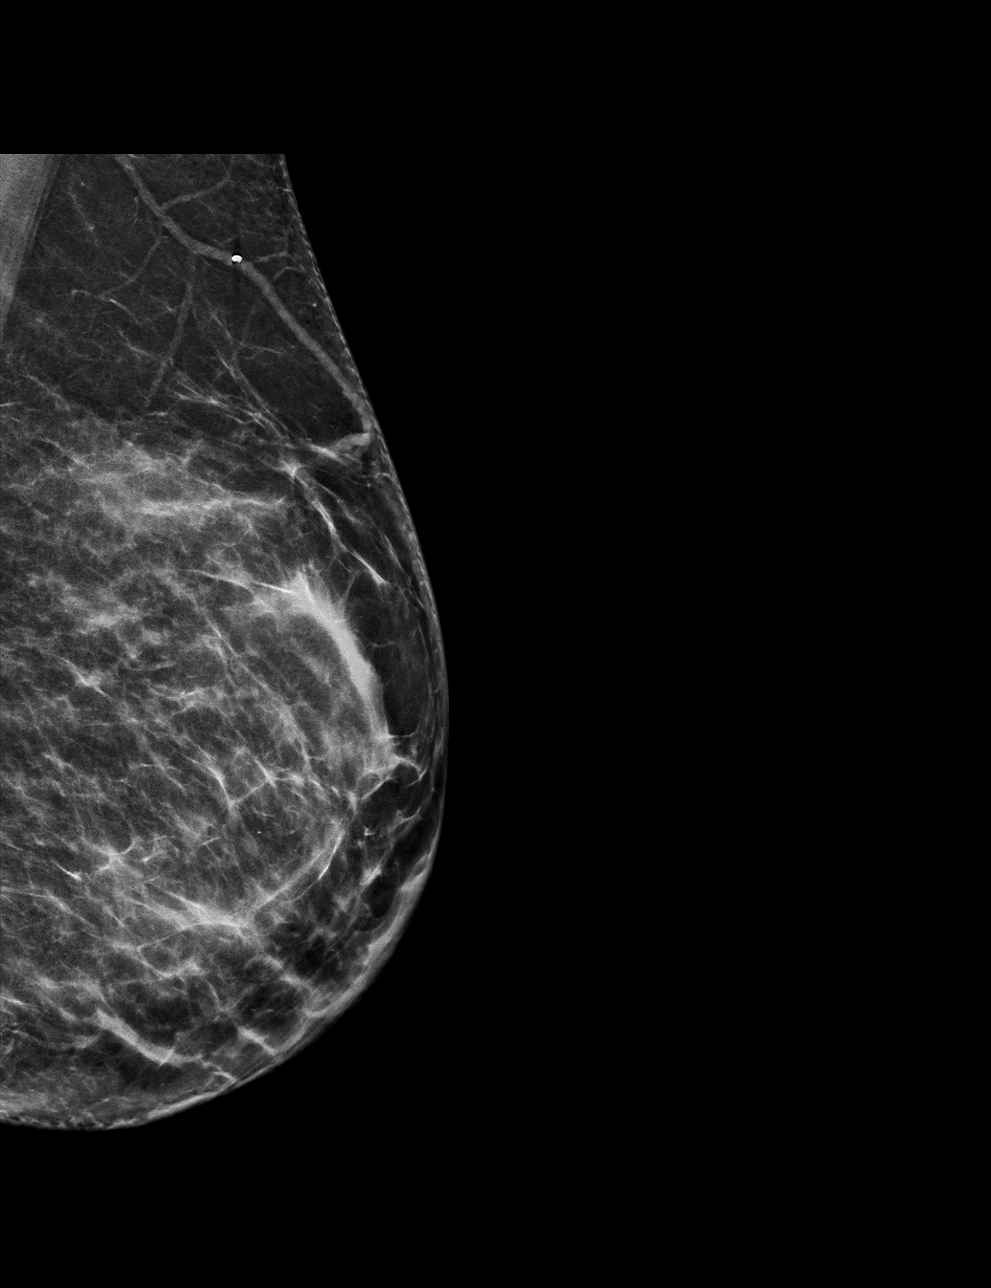

[L MLO synth-2D]
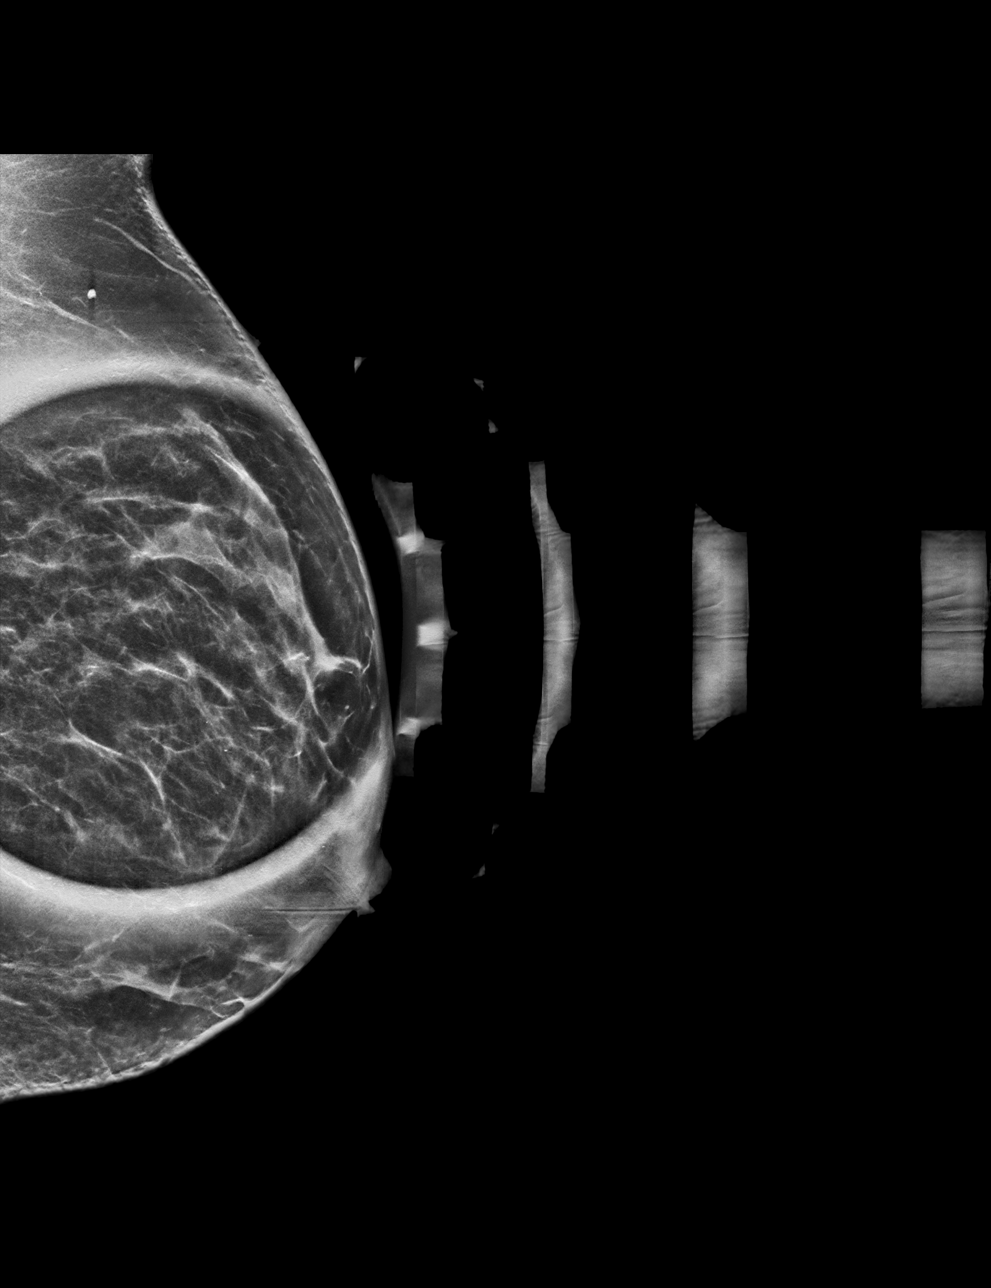

[L ML tomo · tomo slice 33/64.0]
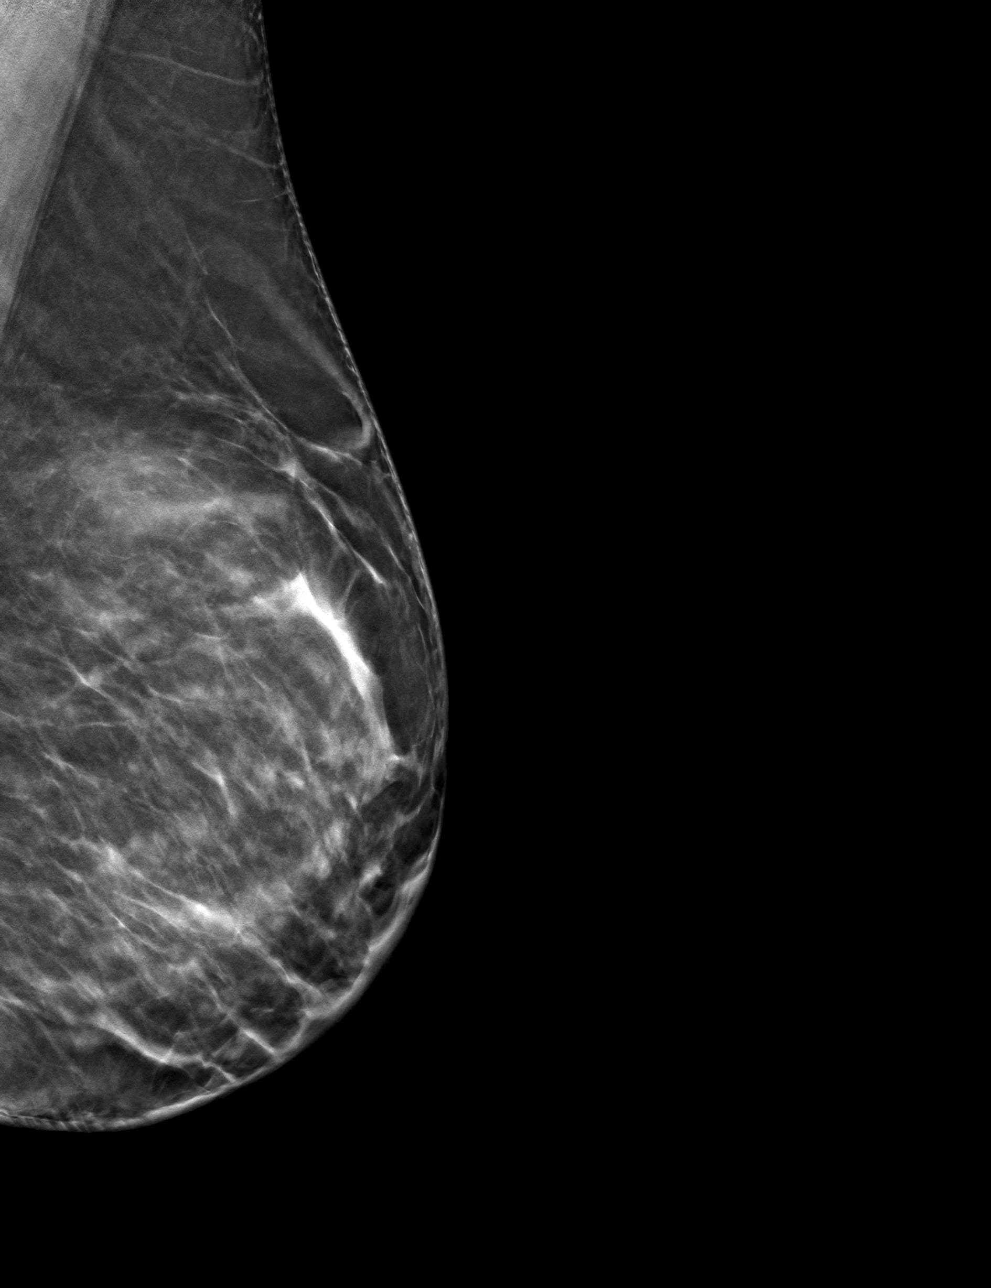

[L MLO tomo · tomo slice 31/60.0]
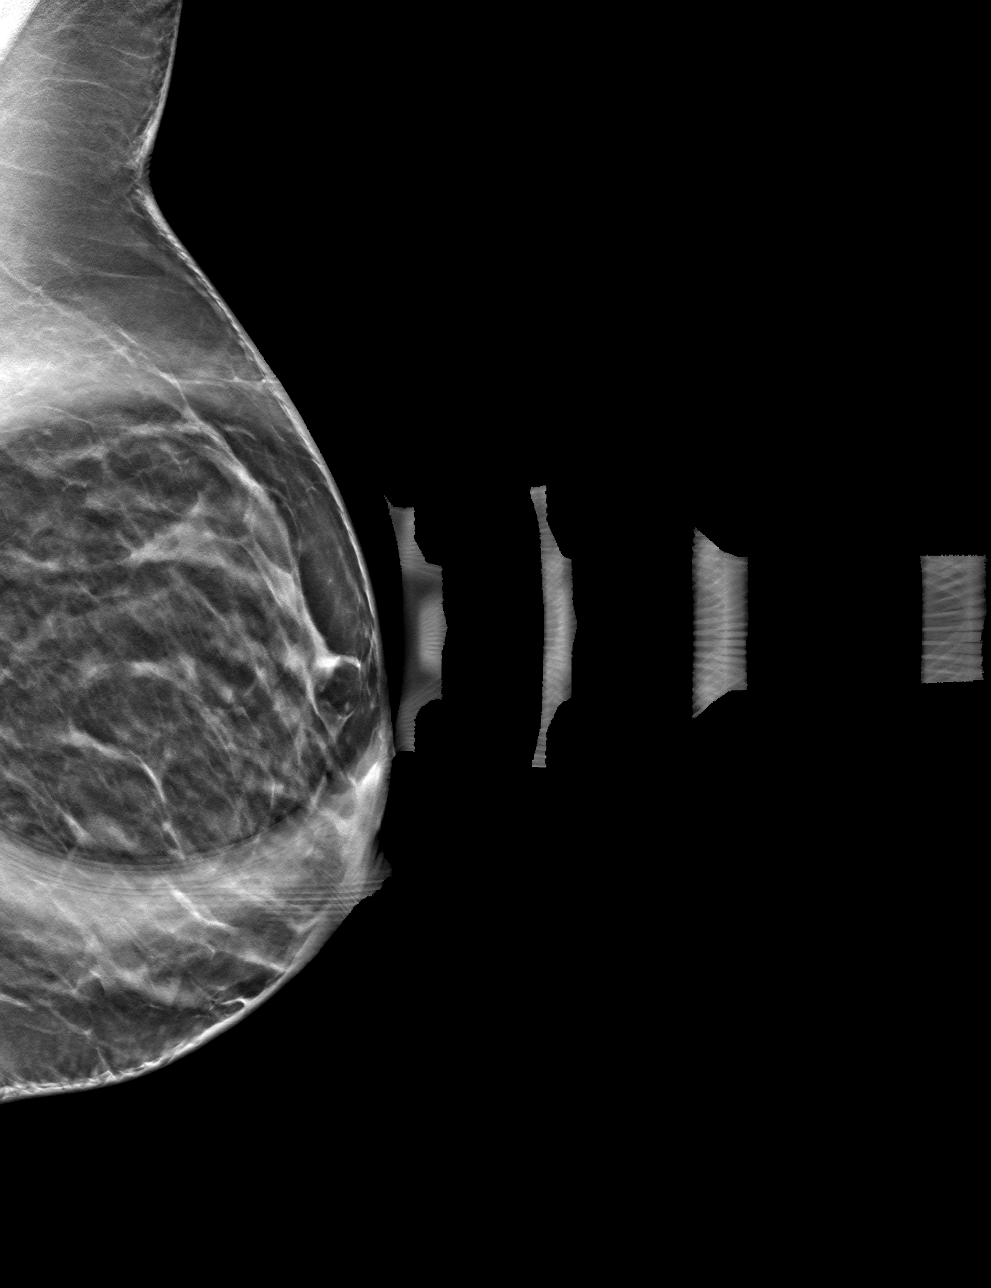

[4 of 12 positions shown; findings below may reference images not displayed]

ACR Breast Density Category b: There are scattered areas of
fibroglandular density.
FINDINGS: Spot compression tomosynthesis MLO and full field mL tomosynthesis
views of the left breast were performed for a questioned asymmetry
seen only on MLO view in the superior left breast. On the additional
imaging the asymmetry partially effaces and is most consistent with
a band of normal fibroglandular tissue. There is interspersed fat.
There is no mass or distortion. No new findings elsewhere in the
left breast.
IMPRESSION: Benign asymmetry consistent with a band of normal tissue in the
superior left breast.

RECOMMENDATION:
Screening mammogram in one year.(Code:[NI])

I have discussed the findings and recommendations with the patient.
If applicable, a reminder letter will be sent to the patient
regarding the next appointment.

BI-RADS CATEGORY  2: Benign.

## 2022-06-03 DIAGNOSIS — F411 Generalized anxiety disorder: Secondary | ICD-10-CM | POA: Diagnosis not present

## 2022-06-08 ENCOUNTER — Encounter: Payer: Self-pay | Admitting: Gastroenterology

## 2022-06-15 ENCOUNTER — Telehealth: Payer: Self-pay | Admitting: Family Medicine

## 2022-06-15 NOTE — Telephone Encounter (Signed)
Patient states she has not had her menstrual in almost two months.  She is under a lot of stress but has never been this long without her cycle.  States she has had her tubes tied.  Will do pregnancy test just to make sure.  Could it be menopause? If negative pregnancy test should she see [place] or get an GYN?  Covering pcp-please advise

## 2022-06-15 NOTE — Telephone Encounter (Signed)
Schedule OV with PCP or can put on DOD schedule for blood testing (hcg, hormones, etc).  Would be unusual to have menopause at this time but not impossible.

## 2022-06-15 NOTE — Telephone Encounter (Signed)
Pt has been scheduled.  °

## 2022-06-22 ENCOUNTER — Ambulatory Visit: Payer: BC Managed Care – PPO | Admitting: Family Medicine

## 2022-06-22 ENCOUNTER — Encounter: Payer: Self-pay | Admitting: Family Medicine

## 2022-06-22 VITALS — BP 138/92 | HR 95 | Temp 97.1°F | Ht 63.0 in | Wt 180.0 lb

## 2022-06-22 DIAGNOSIS — N912 Amenorrhea, unspecified: Secondary | ICD-10-CM | POA: Diagnosis not present

## 2022-06-22 DIAGNOSIS — B372 Candidiasis of skin and nail: Secondary | ICD-10-CM

## 2022-06-22 MED ORDER — NYSTATIN 100000 UNIT/GM EX POWD
1.0000 | Freq: Three times a day (TID) | CUTANEOUS | 0 refills | Status: DC
Start: 2022-06-22 — End: 2022-10-29

## 2022-06-22 NOTE — Progress Notes (Signed)
Assessment & Plan:  1. Amenorrhea Education provided on amenorrhea. - Beta hCG quant (ref lab) - FSH/LH - Prolactin - Progesterone - Estradiol  2. Candidal intertrigo - nystatin (MYCOSTATIN/NYSTOP) powder; Apply 1 Application topically 3 (three) times daily.  Dispense: 60 g; Refill: 0   Follow up plan: Return if symptoms worsen or fail to improve.  [name], MSN, APRN, FNP-C [name]  Subjective:   Patient ID: Allison Galloway, female    DOB: [birth year], 41 y.o.   MRN: [id number]  HPI: Allison Galloway is a 41 y.o. female presenting on 06/22/2022 for blood testing (Last menstrual was in May 2023 ) and Rash (Pelvic area that she noticed today )  Patient is accompanied by her husband who she is okay with being present.  She reports her last menstrual cycle was at the end of May, two months ago.  She did take a home urine pregnancy test which was negative.  She has had a tubal ligation.  States her mom did not go through menopause until she was around 70.  She does not have any older sisters.  She also reports a red rash in her groin folds that appeared this morning.  Denies wearing tight fitting clothing.  States she does not wear underwear as they cause her to develop boils.   ROS: Negative unless specifically indicated above in HPI.   Relevant past medical history reviewed and updated as indicated.   Allergies and medications reviewed and updated.   Current Outpatient Medications:    albuterol (VENTOLIN HFA) 108 (90 Base) MCG/ACT inhaler, Inhale 1-2 puffs into the lungs every 6 (six) hours as needed for wheezing or shortness of breath., Disp: , Rfl:    diazepam (VALIUM) 5 MG tablet, Take 5 mg by mouth 2 (two) times daily as needed., Disp: , Rfl:    fluticasone-salmeterol (ADVAIR HFA) 230-21 MCG/ACT inhaler, Inhale 2 puffs into the lungs 2 (two) times daily., Disp: 12 g, Rfl: 12   hydrOXYzine (VISTARIL) 50 MG capsule, Take 50 mg by mouth 3 (three)  times daily., Disp: , Rfl:    ipratropium-albuterol (DUONEB) 0.5-2.5 (3) MG/3ML SOLN, Take 3 mLs by nebulization every 6 (six) hours as needed (sob/wheezing)., Disp: , Rfl:    tiZANidine (ZANAFLEX) 4 MG tablet, Take 1 tablet (4 mg total) by mouth every 8 (eight) hours as needed for muscle spasms., Disp: 90 tablet, Rfl: 2   traZODone (DESYREL) 100 MG tablet, Take 50 mg by mouth at bedtime., Disp: , Rfl:    Plecanatide (TRULANCE) 3 MG TABS, Take 3 mg by mouth daily. (Patient not taking: Reported on 06/22/2022), Disp: 30 tablet, Rfl: 5  No Known Allergies  Objective:   BP (!) 138/92   Pulse 95   Temp (!) 97.1 F (36.2 C) (Temporal)   Ht '5\' 3"'$  (1.6 m)   Wt 180 lb (81.6 kg)   LMP 03/29/2022 (Approximate)   SpO2 96%   BMI 31.89 kg/m    Physical Exam Vitals reviewed.  Constitutional:      General: She is not in acute distress.    Appearance: Normal appearance. She is not ill-appearing, toxic-appearing or diaphoretic.  HENT:     Head: Normocephalic and atraumatic.  Eyes:     General: No scleral icterus.       Right eye: No discharge.        Left eye: No discharge.     Conjunctiva/sclera: Conjunctivae normal.  Cardiovascular:     Rate and Rhythm: Normal rate.  Pulmonary:     Effort: Pulmonary effort is normal. No respiratory distress.  Musculoskeletal:        General: Normal range of motion.     Cervical back: Normal range of motion.  Skin:    General: Skin is warm and dry.     Capillary Refill: Capillary refill takes less than 2 seconds.     Findings: Rash (erythema consistent with fungal in bilateral groin folds) present.  Neurological:     General: No focal deficit present.     Mental Status: She is alert and oriented to person, place, and time. Mental status is at baseline.  Psychiatric:        Mood and Affect: Mood normal.        Behavior: Behavior normal.        Thought Content: Thought content normal.        Judgment: Judgment normal.

## 2022-06-23 LAB — FSH/LH
FSH: 5.8 m[IU]/mL
LH: 2.9 m[IU]/mL

## 2022-06-23 LAB — ESTRADIOL: Estradiol: 66.5 pg/mL

## 2022-06-23 LAB — BETA HCG QUANT (REF LAB): hCG Quant: <1 m[IU]/mL

## 2022-06-23 LAB — PROLACTIN: Prolactin: 11 ng/mL (ref 4.8–23.3)

## 2022-06-23 LAB — PROGESTERONE: Progesterone: 0.2 ng/mL

## 2022-07-12 ENCOUNTER — Ambulatory Visit: Payer: BC Managed Care – PPO | Admitting: Gastroenterology

## 2022-07-14 ENCOUNTER — Encounter: Payer: Self-pay | Admitting: *Deleted

## 2022-08-03 ENCOUNTER — Ambulatory Visit: Payer: BC Managed Care – PPO | Admitting: Gastroenterology

## 2022-08-24 ENCOUNTER — Encounter: Payer: Self-pay | Admitting: *Deleted

## 2022-08-24 DIAGNOSIS — F411 Generalized anxiety disorder: Secondary | ICD-10-CM | POA: Diagnosis not present

## 2022-08-25 NOTE — Progress Notes (Deleted)
Cardiology Office Note    Date:  08/25/2022   ID:  Allison Galloway, DOB Oct 06, 1981, MRN [id number]  PCP:  Loman Brooklyn, FNP  Cardiologist: Werner Lean, MD    No chief complaint on file.   History of Present Illness:    Allison Galloway is a 41 y.o. female with past medical history of anxiety and depression who presents to the office today for 41-monthfollow-up.   She was examined by Dr. CGasper Sellsin 01/2022 as a new patient referral for evaluation of worsening dyspnea on exertion for the past few months.Reported occasional balance issues but no chest pain or palpitations. A BNP was obtained and WNL at 32. Echocardiogram showed a preserved EF of 65-70% with no regional WMA. She did have moderate LVH but no significant valve abnormalities.     Past Medical History:  Diagnosis Date   Anxiety    Constipation    Conversion disorder    Depression     Past Surgical History:  Procedure Laterality Date   ARTHROSCOPIC REPAIR ACL     CESAREAN SECTION     3   KNEE SURGERY     TUBAL LIGATION      Current Medications: Outpatient Medications Prior to Visit  Medication Sig Dispense Refill   albuterol (VENTOLIN HFA) 108 (90 Base) MCG/ACT inhaler Inhale 1-2 puffs into the lungs every 6 (six) hours as needed for wheezing or shortness of breath.     diazepam (VALIUM) 5 MG tablet Take 5 mg by mouth 2 (two) times daily as needed.     fluticasone-salmeterol (ADVAIR HFA) 230-21 MCG/ACT inhaler Inhale 2 puffs into the lungs 2 (two) times daily. 12 g 12   hydrOXYzine (VISTARIL) 50 MG capsule Take 50 mg by mouth 3 (three) times daily.     ipratropium-albuterol (DUONEB) 0.5-2.5 (3) MG/3ML SOLN Take 3 mLs by nebulization every 6 (six) hours as needed (sob/wheezing).     nystatin (MYCOSTATIN/NYSTOP) powder Apply 1 Application topically 3 (three) times daily. 60 g 0   Plecanatide (TRULANCE) 3 MG TABS Take 3 mg by mouth daily. (Patient not taking: Reported on 06/22/2022) 30 tablet 5    tiZANidine (ZANAFLEX) 4 MG tablet Take 1 tablet (4 mg total) by mouth every 8 (eight) hours as needed for muscle spasms. 90 tablet 2   traZODone (DESYREL) 100 MG tablet Take 50 mg by mouth at bedtime.     No facility-administered medications prior to visit.     Allergies:   Patient has no known allergies.   Social History   Socioeconomic History   Marital status: Single    Spouse name: Not on file   Number of children: Not on file   Years of education: Not on file   Highest education level: Not on file  Occupational History   Not on file  Tobacco Use   Smoking status: Former    Packs/day: 0.25    Types: Cigarettes    Quit date: 08/2021    Years since quitting: 0.9   Smokeless tobacco: Never  Vaping Use   Vaping Use: Former  Substance and Sexual Activity   Alcohol use: No   Drug use: No   Sexual activity: Yes    Birth control/protection: None  Other Topics Concern   Not on file  Social History Narrative   Not on file   Social Determinants of Health   Financial Resource Strain: Not on file  Food Insecurity: Not on file  Transportation Needs: Not on file  Physical Activity: Not on file  Stress: Not on file  Social Connections: Not on file     Family History:  The patient's ***family history includes Anxiety disorder in an other family member; Arthritis in her mother; Asthma in her mother and sister; Bipolar disorder in an other family member; Depression in her mother, sister, and another family member; Diabetes in her mother; Hypertension in an other family member; Learning disabilities in her mother; Mental illness in her mother and sister; Seizures in her daughter.   Review of Systems:    Please see the history of present illness.     All other systems reviewed and are otherwise negative except as noted above.   Physical Exam:    VS:  There were no vitals taken for this visit.   General: Well developed, well nourished,female appearing in no acute  distress. Head: Normocephalic, atraumatic. Neck: No carotid bruits. JVD not elevated.  Lungs: Respirations regular and unlabored, without wheezes or rales.  Heart: ***Regular rate and rhythm. No S3 or S4.  No murmur, no rubs, or gallops appreciated. Abdomen: Appears non-distended. No obvious abdominal masses. Msk:  Strength and tone appear normal for age. No obvious joint deformities or effusions. Extremities: No clubbing or cyanosis. No edema.  Distal pedal pulses are 2+ bilaterally. Neuro: Alert and oriented X 3. Moves all extremities spontaneously. No focal deficits noted. Psych:  Responds to questions appropriately with a normal affect. Skin: No rashes or lesions noted  Wt Readings from Last 3 Encounters:  06/22/22 180 lb (81.6 kg)  04/20/22 181 lb 12.8 oz (82.5 kg)  04/07/22 180 lb (81.6 kg)        Studies/Labs Reviewed:   EKG:  EKG is*** ordered today.  The ekg ordered today demonstrates ***  Recent Labs: 01/21/2022: TSH 1.40 02/02/2022: ALT 26; BUN 11; Creatinine, Ser 0.95; Hemoglobin 13.3; Platelets 230; Potassium 3.7; Sodium 137 02/26/2022: B Natriuretic Peptide 32.0   Lipid Panel No results found for: "CHOL", "TRIG", "HDL", "CHOLHDL", "VLDL", "LDLCALC", "LDLDIRECT"  Additional studies/ records that were reviewed today include:   Echocardiogram: 02/2022 IMPRESSIONS     1. Left ventricular ejection fraction, by estimation, is 65 to 70%. The  left ventricle has normal function. The left ventricle has no regional  wall motion abnormalities. There is moderate left ventricular hypertrophy.  Left ventricular diastolic  parameters were normal.   2. Right ventricular systolic function is normal. The right ventricular  size is normal.   3. The mitral valve is normal in structure. No evidence of mitral valve  regurgitation. No evidence of mitral stenosis.   4. The aortic valve was not well visualized. Aortic valve regurgitation  is not visualized. No aortic stenosis is  present.   Assessment:    No diagnosis found.   Plan:   In order of problems listed above:  1. Dyspnea on Exertion - ***   Shared Decision Making/Informed Consent:   {Are you ordering a CV Procedure (e.g. stress test, cath, DCCV, TEE, etc)?   Press F2        :[id number]}    Medication Adjustments/Labs and Tests Ordered: Current medicines are reviewed at length with the patient today.  Concerns regarding medicines are outlined above.  Medication changes, Labs and Tests ordered today are listed in the Patient Instructions below. There are no Patient Instructions on file for this visit.   Signed, Erma Heritage, PA-C  08/25/2022 9:54 AM    [address] [place],  [place] [address] Phone: [phone] Fax: [phone]

## 2022-08-27 ENCOUNTER — Encounter: Payer: Self-pay | Admitting: Student

## 2022-08-27 ENCOUNTER — Ambulatory Visit: Payer: BC Managed Care – PPO | Admitting: Student

## 2022-09-17 ENCOUNTER — Other Ambulatory Visit: Payer: Self-pay | Admitting: Neurological Surgery

## 2022-09-17 DIAGNOSIS — D329 Benign neoplasm of meninges, unspecified: Secondary | ICD-10-CM

## 2022-09-23 ENCOUNTER — Encounter (HOSPITAL_COMMUNITY): Payer: Self-pay | Admitting: Emergency Medicine

## 2022-09-23 ENCOUNTER — Other Ambulatory Visit: Payer: Self-pay

## 2022-09-23 ENCOUNTER — Emergency Department (HOSPITAL_COMMUNITY): Payer: Medicaid Other

## 2022-09-23 ENCOUNTER — Emergency Department (HOSPITAL_COMMUNITY)
Admission: EM | Admit: 2022-09-23 | Discharge: 2022-09-23 | Disposition: A | Discharge status: Home / Self Care | Payer: Medicaid Other | Attending: Emergency Medicine | Admitting: Emergency Medicine

## 2022-09-23 DIAGNOSIS — M25551 Pain in right hip: Secondary | ICD-10-CM | POA: Insufficient documentation

## 2022-09-23 DIAGNOSIS — M545 Low back pain, unspecified: Secondary | ICD-10-CM | POA: Diagnosis not present

## 2022-09-23 DIAGNOSIS — M25552 Pain in left hip: Secondary | ICD-10-CM | POA: Diagnosis not present

## 2022-09-23 DIAGNOSIS — M5442 Lumbago with sciatica, left side: Secondary | ICD-10-CM | POA: Diagnosis not present

## 2022-09-23 MED ORDER — LORAZEPAM 1 MG PO TABS
1.0000 mg | ORAL_TABLET | Freq: Once | ORAL | Status: AC
  Administered 2022-09-23: 1 mg via ORAL
  Filled 2022-09-23: qty 1

## 2022-09-23 MED ORDER — PREDNISONE 20 MG PO TABS: 40.0000 mg | ORAL_TABLET | Freq: Every day | ORAL | 0 refills | Status: DC

## 2022-09-23 MED ORDER — ACETAMINOPHEN 325 MG PO TABS
650.0000 mg | ORAL_TABLET | Freq: Once | ORAL | Status: AC
  Administered 2022-09-23: 650 mg via ORAL
  Filled 2022-09-23: qty 2

## 2022-09-23 MED ORDER — METHOCARBAMOL 500 MG PO TABS: 500.0000 mg | ORAL_TABLET | Freq: Two times a day (BID) | ORAL | 0 refills | Status: DC

## 2022-09-23 MED ORDER — METHOCARBAMOL 500 MG PO TABS
500.0000 mg | ORAL_TABLET | Freq: Once | ORAL | Status: AC
  Administered 2022-09-23: 500 mg via ORAL
  Filled 2022-09-23: qty 1

## 2022-09-23 NOTE — ED Provider Notes (Signed)
[facility] EMERGENCY DEPARTMENT Provider Note   CSN: [id number] Arrival date & time: 09/23/22  1247     History  Chief Complaint  Patient presents with   Hip Pain    Allison Galloway is a 41 y.o. female with past medical history significant for anxiety, depression, conversion disorder who presents with concern for bilateral hip and leg pain since yesterday.  She denies any falls, trauma.  She reports that the pain radiates down the back of her leg.  Patient reports that she tried a muscle relaxant last night which helped some.  She denies any persistent numbness, tingling but reports she gets some pins and needle sensation especially on the left leg.  Patient reports she has been experiencing these problems intermittently since she was pregnant and had a C-section.  She denies any history of IV drug use, cancer, chronic corticosteroid use.    Hip Pain       Home Medications Prior to Admission medications   Medication Sig Start Date End Date Taking? Authorizing Provider  methocarbamol (ROBAXIN) 500 MG tablet Take 1 tablet (500 mg total) by mouth 2 (two) times daily. 09/23/22  Yes Bradey Luzier H, PA-C  predniSONE (DELTASONE) 20 MG tablet Take 2 tablets (40 mg total) by mouth daily. 09/23/22  Yes Nakiyah Beverley H, PA-C  albuterol (VENTOLIN HFA) 108 (90 Base) MCG/ACT inhaler Inhale 1-2 puffs into the lungs every 6 (six) hours as needed for wheezing or shortness of breath. 01/04/22 01/04/23  [provider]  diazepam (VALIUM) 5 MG tablet Take 5 mg by mouth 2 (two) times daily as needed. 06/03/22   [provider]  fluticasone-salmeterol (ADVAIR HFA) 230-21 MCG/ACT inhaler Inhale 2 puffs into the lungs 2 (two) times daily. 01/21/22   Freddi Starr, MD  hydrOXYzine (VISTARIL) 50 MG capsule Take 50 mg by mouth 3 (three) times daily. 04/08/22   [provider]  ipratropium-albuterol (DUONEB) 0.5-2.5 (3) MG/3ML SOLN Take 3 mLs by nebulization every 6 (six)  hours as needed (sob/wheezing). 01/10/22   [provider]  nystatin (MYCOSTATIN/NYSTOP) powder Apply 1 Application topically 3 (three) times daily. 06/22/22   Loman Brooklyn, FNP  Plecanatide (TRULANCE) 3 MG TABS Take 3 mg by mouth daily. Patient not taking: Reported on 06/22/2022 04/07/22   Mahala Menghini, PA-C  tiZANidine (ZANAFLEX) 4 MG tablet Take 1 tablet (4 mg total) by mouth every 8 (eight) hours as needed for muscle spasms. 03/19/22   Loman Brooklyn, FNP  traZODone (DESYREL) 100 MG tablet Take 50 mg by mouth at bedtime. 04/08/22   [provider]      Allergies    Patient has no known allergies.    Review of Systems   Review of Systems  Musculoskeletal:  Positive for arthralgias and back pain.  All other systems reviewed and are negative.   Physical Exam Updated Vital Signs BP (!) 151/108 (BP Location: Right Arm)   Pulse (!) 110   Temp 98 F (36.7 C) (Oral)   Resp 20   Ht '5\' 3"'$  (1.6 m)   Wt 81.6 kg   LMP 09/02/2022 (Approximate)   SpO2 96%   BMI 31.89 kg/m  Physical Exam Vitals and nursing note reviewed.  Constitutional:      General: She is not in acute distress.    Appearance: Normal appearance.  HENT:     Head: Normocephalic and atraumatic.  Eyes:     General:        Right eye: No discharge.  Left eye: No discharge.  Cardiovascular:     Rate and Rhythm: Normal rate and regular rhythm.     Heart sounds: No murmur heard.    No friction rub. No gallop.  Pulmonary:     Effort: Pulmonary effort is normal.     Breath sounds: Normal breath sounds.  Abdominal:     General: Bowel sounds are normal.     Palpations: Abdomen is soft.  Musculoskeletal:     Comments: Intact strength 5/5 bilateral lower extremities, some hesitance to movement, she seems to have an intention tremor throughout.  She has positive straight leg raise bilaterally, left greater than right.  Skin:    General: Skin is warm and dry.     Capillary Refill: Capillary  refill takes less than 2 seconds.  Neurological:     Mental Status: She is alert and oriented to person, place, and time. Mental status is at baseline.  Psychiatric:        Mood and Affect: Mood normal.     Comments: Patient with stuttering speech, and frustration about her speech abnormalities, she has previous diagnosed history of conversion disorder reports that she is near her baseline.     ED Results / Procedures / Treatments   Labs (all labs ordered are listed, but only abnormal results are displayed) Labs Reviewed - No data to display  EKG None  Radiology DG HIPS BILAT WITH PELVIS MIN 5 VIEWS  Result Date: 09/23/2022 CLINICAL DATA:  Hip pain EXAM: DG HIP (WITH OR WITHOUT PELVIS) 5+V BILAT COMPARISON:  None Available. FINDINGS: No evidence for an acute fracture. No subluxation or dislocation. No femoral neck fracture. Sclerotic changes in the symphysis pubis are likely chronic. IMPRESSION: No acute bony findings. Electronically Signed   By: Misty Stanley M.D.   On: 09/23/2022 15:51   DG Lumbar Spine 2-3 Views  Result Date: 09/23/2022 CLINICAL DATA:  Hip pain EXAM: LUMBAR SPINE - 2-3 VIEW COMPARISON:  None Available. FINDINGS: Normal alignment of lumbar vertebral bodies. No loss of vertebral body height or disc height. No pars fracture. No subluxation. IMPRESSION: No acute osseous abnormality. Electronically Signed   By: Suzy Bouchard M.D.   On: 09/23/2022 15:39    Procedures Procedures    Medications Ordered in ED Medications  methocarbamol (ROBAXIN) tablet 500 mg (500 mg Oral Given 09/23/22 1513)  acetaminophen (TYLENOL) tablet 650 mg (650 mg Oral Given 09/23/22 1513)  LORazepam (ATIVAN) tablet 1 mg (1 mg Oral Given 09/23/22 1513)    ED Course/ Medical Decision Making/ A&P                           Medical Decision Making Amount and/or Complexity of Data Reviewed Radiology: ordered.  Risk OTC drugs. Prescription drug management.   An overall well-appearing  41 year old female who presents with concern for low back pain, hip pain, with radiation down left leg for the last 2 days.  She denies any falls, trauma.  She does report previous history of similar symptoms since she had a C-section many years ago.  No previous history of known sciatica but does report similar symptoms with radiation down the leg.  She denies any urinary or fecal incontinence.  She denies previous history of IV drug use, chronic corticosteroid use, cancer.  My exam patient with paraspinous muscle tenderness in the lumbar June, positive straight leg raise on the left, tenderness to palpation of the bilateral trochanters.  No step-off, deformity, patient  can ambulate with some difficulty.  She is neurovascular intact on my exam. I independently interpreted imaging including plain film radiograph of bilateral hips, and lumbar spine which shows no evidence of fracture, dislocation, or other findings to explain patient's pain. I agree with the radiologist interpretation.  Patient's pain is consistent with muscle spasms, musculoskeletal pain, and low back pain with sciatica, discussed conservative versus aggressive treatment, given severity of patient's pain, and symptoms that are consistent with sciatica I think would be reasonable to treat with steroids, muscle relaxants, I Profen, Tylenol, and close orthopedic follow-up at this time.  Patient understands agrees to plan, is discharged in stable condition, during her time of evaluation she reports improvement of her pain after Tylenol, Robaxin, and Ativan. Final Clinical Impression(s) / ED Diagnoses Final diagnoses:  Acute bilateral low back pain with left-sided sciatica  Bilateral hip pain    Rx / DC Orders ED Discharge Orders          Ordered    predniSONE (DELTASONE) 20 MG tablet  Daily        09/23/22 1611    methocarbamol (ROBAXIN) 500 MG tablet  2 times daily        09/23/22 1611              Esther Bradstreet, [place] H,  PA-C 09/23/22 [place]    Kommor, Debe Coder, MD 09/24/22 1330

## 2022-09-23 NOTE — ED Notes (Signed)
Patient transported to X-ray 

## 2022-09-23 NOTE — ED Triage Notes (Signed)
Patient c/o bilateral hip and leg pain since yesterday.  Patient denies falls/trauma.

## 2022-09-23 NOTE — Discharge Instructions (Addendum)

## 2022-09-24 ENCOUNTER — Ambulatory Visit (HOSPITAL_COMMUNITY)
Admission: RE | Admit: 2022-09-24 | Discharge: 2022-09-24 | Disposition: A | Discharge status: Home / Self Care | Payer: BC Managed Care – PPO | Source: Ambulatory Visit | Attending: Neurological Surgery | Admitting: Neurological Surgery

## 2022-09-24 ENCOUNTER — Telehealth: Payer: Self-pay

## 2022-09-24 DIAGNOSIS — G939 Disorder of brain, unspecified: Secondary | ICD-10-CM | POA: Diagnosis not present

## 2022-09-24 DIAGNOSIS — D329 Benign neoplasm of meninges, unspecified: Secondary | ICD-10-CM | POA: Insufficient documentation

## 2022-09-24 DIAGNOSIS — R220 Localized swelling, mass and lump, head: Secondary | ICD-10-CM | POA: Diagnosis not present

## 2022-09-24 MED ORDER — GADOBUTROL 1 MMOL/ML IV SOLN
8.0000 mL | Freq: Once | INTRAVENOUS | Status: AC | PRN
  Administered 2022-09-24: 8 mL via INTRAVENOUS

## 2022-09-24 NOTE — Telephone Encounter (Signed)
Transition Care Management Unsuccessful Follow-up Telephone Call  Date of discharge and from where:  09/23/2022 [facility]  Attempts:  1st Attempt  Reason for unsuccessful TCM follow-up call:  No answer/busy

## 2022-09-28 NOTE — Telephone Encounter (Signed)
Transition Care Management Follow-up Telephone Call Date of discharge and from where: 09/23/2022 [facility] How have you been since you were released from the hospital? Patient states that she is still having jerking in her left leg  Any questions or concerns? Yes - patient would like to follow up to see if anything further can be done for her leg - she states that along with the jerking and pain her feet and ankles are swelling up daily   Items Reviewed: Did the pt receive and understand the discharge instructions provided? Yes  Medications obtained and verified? Yes  Other? Yes  Any new allergies since your discharge? No Dietary orders reviewed? Yes Do you have support at home? Yes   Home Care and Equipment/Supplies: Were home health services ordered? not applicable If so, what is the name of the agency? [facility]  Has the agency set up a time to come to the patient's home? not applicable Were any new equipment or medical supplies ordered?  No What is the name of the medical supply agency? [facility] Were you able to get the supplies/equipment? no Do you have any questions related to the use of the equipment or supplies? No  Functional Questionnaire: (I = Independent and D = Dependent) ADLs: I   Bathing/Dressing- I  Meal Prep- I  Eating- I  Maintaining continence- I  Transferring/Ambulation- I - patient states that she is having trouble with walking and getting around due to the swelling   Managing Meds- I  Follow up appointments reviewed:  PCP Hospital f/u appt confirmed? Yes  Scheduled to see [name]  on 10/01/2022 @ 835am . Patient requested Friday as her husband will be able to bring her  [facility] f/u appt confirmed?  na   Are transportation arrangements needed? No  If their condition worsens, is the pt aware to call PCP or go to the Emergency Dept.? Yes Was the patient provided with contact information for the PCP's office or ED? Yes Was to pt encouraged to call back with  questions or concerns? Yes

## 2022-10-01 ENCOUNTER — Encounter: Payer: Self-pay | Admitting: Family Medicine

## 2022-10-01 ENCOUNTER — Ambulatory Visit (INDEPENDENT_AMBULATORY_CARE_PROVIDER_SITE_OTHER): Payer: BC Managed Care – PPO | Admitting: Family Medicine

## 2022-10-01 VITALS — BP 147/108 | HR 86 | Temp 97.3°F | Ht 63.0 in | Wt 185.8 lb

## 2022-10-01 DIAGNOSIS — F449 Dissociative and conversion disorder, unspecified: Secondary | ICD-10-CM

## 2022-10-01 DIAGNOSIS — G8929 Other chronic pain: Secondary | ICD-10-CM | POA: Diagnosis not present

## 2022-10-01 DIAGNOSIS — M5442 Lumbago with sciatica, left side: Secondary | ICD-10-CM | POA: Diagnosis not present

## 2022-10-01 DIAGNOSIS — M5441 Lumbago with sciatica, right side: Secondary | ICD-10-CM | POA: Diagnosis not present

## 2022-10-01 DIAGNOSIS — G249 Dystonia, unspecified: Secondary | ICD-10-CM

## 2022-10-01 MED ORDER — METHYLPREDNISOLONE ACETATE 40 MG/ML IJ SUSP
40.0000 mg | Freq: Once | INTRAMUSCULAR | Status: AC
  Administered 2022-10-01: 40 mg via INTRAMUSCULAR

## 2022-10-01 MED ORDER — KETOROLAC TROMETHAMINE 30 MG/ML IJ SOLN
30.0000 mg | Freq: Once | INTRAMUSCULAR | Status: AC
  Administered 2022-10-01: 30 mg via INTRAMUSCULAR

## 2022-10-01 NOTE — Progress Notes (Signed)
Subjective:  Patient ID: Allison Galloway, female    DOB: [birth year], 41 y.o.   MRN: [id number]  Patient Care Team: Baruch Gouty, FNP as PCP - General (Family Medicine) Werner Lean, MD as PCP - Cardiology (Cardiology)   Chief Complaint:  ER follow up (AP - lower back pain with sciatica.  Patient states that the pain goes all the way up her back and states that it is no better. ) and Seizures (On and off )   HPI: Allison Galloway is a 41 y.o. female presenting on 10/01/2022 for ER follow up (AP - lower back pain with sciatica.  Patient states that the pain goes all the way up her back and states that it is no better. ) and Seizures (On and off )   Pt presents today for evaluation after recent ED visit for back pain with sciatica. She was placed on Robaxin and prednisone. States her back has improved slightly but is still present. She attributes the pain to her constant dyskinesia due to conversion disorder. She was seen by neurology for this but did not feel the neurologist listened to her or treated her appropriately. She was going to [facility] and states all they do is change her medications every time she goes. States nothing they have given her for the symptoms have been beneficial. She has tried CBD gummies with some relief of symptoms  States her bilateral back continues to hurt and runs into both of her upper legs. No saddle anesthesia, loss of bowel/bladder function, fever, chills, weakness, or weight loss. Sharp, shooting, and stabbing in nature. Medications from ED minimally beneficial.     Relevant past medical, surgical, family, and social history reviewed and updated as indicated.  Allergies and medications reviewed and updated. Data reviewed: Chart in Epic.   Past Medical History:  Diagnosis Date   Anxiety    Constipation    Conversion disorder    Depression     Past Surgical History:  Procedure Laterality Date   ARTHROSCOPIC REPAIR ACL     CESAREAN SECTION      3   KNEE SURGERY     TUBAL LIGATION      Social History   Socioeconomic History   Marital status: Single    Spouse name: Not on file   Number of children: Not on file   Years of education: Not on file   Highest education level: Not on file  Occupational History   Not on file  Tobacco Use   Smoking status: Former    Packs/day: 0.25    Types: Cigarettes    Quit date: 08/2021    Years since quitting: 1.0   Smokeless tobacco: Never  Vaping Use   Vaping Use: Former  Substance and Sexual Activity   Alcohol use: No   Drug use: No   Sexual activity: Yes    Birth control/protection: None  Other Topics Concern   Not on file  Social History Narrative   Not on file   Social Determinants of Health   Financial Resource Strain: Not on file  Food Insecurity: Not on file  Transportation Needs: Not on file  Physical Activity: Not on file  Stress: Not on file  Social Connections: Not on file  Intimate Partner Violence: Not on file    Outpatient Encounter Medications as of 10/01/2022  Medication Sig   albuterol (VENTOLIN HFA) 108 (90 Base) MCG/ACT inhaler Inhale 1-2 puffs into the lungs every 6 (six) hours as  needed for wheezing or shortness of breath.   fluticasone-salmeterol (ADVAIR HFA) 230-21 MCG/ACT inhaler Inhale 2 puffs into the lungs 2 (two) times daily.   ipratropium-albuterol (DUONEB) 0.5-2.5 (3) MG/3ML SOLN Take 3 mLs by nebulization every 6 (six) hours as needed (sob/wheezing).   LORazepam (ATIVAN) 0.5 MG tablet Take 0.5 mg by mouth 3 (three) times daily as needed.   methocarbamol (ROBAXIN) 500 MG tablet Take 1 tablet (500 mg total) by mouth 2 (two) times daily.   nystatin (MYCOSTATIN/NYSTOP) powder Apply 1 Application topically 3 (three) times daily.   tiZANidine (ZANAFLEX) 4 MG tablet Take 1 tablet (4 mg total) by mouth every 8 (eight) hours as needed for muscle spasms.   traZODone (DESYREL) 100 MG tablet Take 50 mg by mouth at bedtime.   Plecanatide (TRULANCE) 3 MG  TABS Take 3 mg by mouth daily. (Patient not taking: Reported on 06/22/2022)   [DISCONTINUED] diazepam (VALIUM) 5 MG tablet Take 5 mg by mouth 2 (two) times daily as needed.   [DISCONTINUED] hydrOXYzine (VISTARIL) 50 MG capsule Take 50 mg by mouth 3 (three) times daily.   [DISCONTINUED] predniSONE (DELTASONE) 20 MG tablet Take 2 tablets (40 mg total) by mouth daily.   [EXPIRED] ketorolac (TORADOL) 30 MG/ML injection 30 mg    [EXPIRED] methylPREDNISolone acetate (DEPO-MEDROL) injection 40 mg    No facility-administered encounter medications on file as of 10/01/2022.    No Known Allergies  Review of Systems  Constitutional:  Negative for activity change, appetite change, chills, diaphoresis, fatigue, fever and unexpected weight change.  HENT: Negative.    Eyes: Negative.  Negative for photophobia and visual disturbance.  Respiratory:  Negative for cough, chest tightness and shortness of breath.   Cardiovascular:  Negative for chest pain, palpitations and leg swelling.  Gastrointestinal:  Negative for abdominal pain, blood in stool, constipation, diarrhea, nausea and vomiting.  Endocrine: Negative.   Genitourinary:  Negative for decreased urine volume, difficulty urinating, dysuria, frequency and urgency.  Musculoskeletal:  Positive for arthralgias, back pain and gait problem. Negative for myalgias, neck pain and neck stiffness.  Skin: Negative.   Allergic/Immunologic: Negative.   Neurological:  Positive for tremors and speech difficulty. Negative for dizziness, seizures, syncope, facial asymmetry, weakness, light-headedness, numbness and headaches.  Hematological: Negative.   Psychiatric/Behavioral:  Positive for decreased concentration and sleep disturbance. Negative for agitation, behavioral problems, confusion, dysphoric mood, hallucinations, self-injury and suicidal ideas. The patient is nervous/anxious. The patient is not hyperactive.   All other systems reviewed and are negative.        Objective:  BP (!) 147/108   Pulse 86   Temp (!) 97.3 F (36.3 C) (Temporal)   Ht '5\' 3"'$  (1.6 m)   Wt 185 lb 12.8 oz (84.3 kg)   LMP 09/02/2022 (Approximate)   SpO2 97%   BMI 32.91 kg/m    Wt Readings from Last 3 Encounters:  10/01/22 185 lb 12.8 oz (84.3 kg)  09/23/22 180 lb (81.6 kg)  06/22/22 180 lb (81.6 kg)    Physical Exam Vitals and nursing note reviewed.  Constitutional:      General: She is not in acute distress.    Appearance: Normal appearance. She is well-developed and well-groomed. She is not ill-appearing, toxic-appearing or diaphoretic.  HENT:     Head: Normocephalic and atraumatic.     Jaw: There is normal jaw occlusion.     Right Ear: Hearing normal.     Left Ear: Hearing normal.     Nose: Nose normal.  Mouth/Throat:     Lips: Pink.     Mouth: Mucous membranes are moist.     Pharynx: Oropharynx is clear. Uvula midline.  Eyes:     General: Lids are normal.     Extraocular Movements: Extraocular movements intact.     Conjunctiva/sclera: Conjunctivae normal.     Pupils: Pupils are equal, round, and reactive to light.  Neck:     Thyroid: No thyroid mass, thyromegaly or thyroid tenderness.     Vascular: No carotid bruit or JVD.     Trachea: Trachea and phonation normal.  Cardiovascular:     Rate and Rhythm: Normal rate and regular rhythm.     Chest Wall: PMI is not displaced.     Pulses: Normal pulses.     Heart sounds: Normal heart sounds. No murmur heard.    No friction rub. No gallop.  Pulmonary:     Effort: Pulmonary effort is normal. No respiratory distress.     Breath sounds: Normal breath sounds. No wheezing.  Abdominal:     General: Bowel sounds are normal. There is no distension or abdominal bruit.     Palpations: Abdomen is soft. There is no hepatomegaly or splenomegaly.     Tenderness: There is no abdominal tenderness. There is no right CVA tenderness or left CVA tenderness.     Hernia: No hernia is present.  Musculoskeletal:      Cervical back: Normal range of motion and neck supple.     Thoracic back: Normal.     Lumbar back: Tenderness present. No swelling, edema, deformity, signs of trauma, lacerations, spasms or bony tenderness. Decreased range of motion. Positive right straight leg raise test and positive left straight leg raise test. No scoliosis.     Right hip: Normal.     Left hip: Normal.     Right lower leg: No edema.     Left lower leg: No edema.  Lymphadenopathy:     Cervical: No cervical adenopathy.  Skin:    General: Skin is warm and dry.     Capillary Refill: Capillary refill takes less than 2 seconds.     Coloration: Skin is not cyanotic, jaundiced or pale.     Findings: No rash.  Neurological:     General: No focal deficit present.     Mental Status: She is alert and oriented to person, place, and time.     Cranial Nerves: Dysarthria present. No cranial nerve deficit.     Sensory: Sensation is intact. No sensory deficit.     Motor: Tremor present. No weakness.     Coordination: Coordination abnormal.     Gait: Gait abnormal.     Deep Tendon Reflexes: Reflexes are normal and symmetric. Reflexes normal.     Comments: Dyskinesia of bilateral upper extremities   Psychiatric:        Attention and Perception: Attention and perception normal.        Mood and Affect: Mood and affect normal.        Speech: Speech is delayed.        Behavior: Behavior normal. Behavior is cooperative.        Thought Content: Thought content normal.        Cognition and Memory: Cognition and memory normal.        Judgment: Judgment normal.     Results for orders placed or performed in visit on 06/22/22  Beta hCG quant (ref lab)  Result Value Ref Range   hCG Quant <1  mIU/mL  FSH/LH  Result Value Ref Range   LH 2.9 mIU/mL   FSH 5.8 mIU/mL  Prolactin  Result Value Ref Range   Prolactin 11.0 4.8 - 23.3 ng/mL  Progesterone  Result Value Ref Range   Progesterone 0.2 ng/mL  Estradiol  Result Value Ref Range    Estradiol 66.5 pg/mL       Pertinent labs & imaging results that were available during my care of the patient were reviewed by me and considered in my medical decision making.  Assessment & Plan:  Shineka was seen today for er follow up and seizures.  Diagnoses and all orders for this visit:  Chronic bilateral low back pain with bilateral sciatica No red flags concerning for cauda equina syndrome. Will burst with steroids and Toradol in office. Can continue robaxin as prescribed. Report new, worsening, or persistent symptoms.  -     ketorolac (TORADOL) 30 MG/ML injection 30 mg -     methylPREDNISolone acetate (DEPO-MEDROL) injection 40 mg  Conversion disorder Dyskinesia  Pt would benefit from neuropsych evaluation. Referral placed today. If unable to get in to see them soon, will consider antiparkinson medications to see if beneficial as benzos and muscle relaxers have not been.  -     Ambulatory referral to Neuropsychology     Continue all other maintenance medications.  Follow up plan: Return if symptoms worsen or fail to improve.   Continue healthy lifestyle choices, including diet (rich in fruits, vegetables, and lean proteins, and low in salt and simple carbohydrates) and exercise (at least 30 minutes of moderate physical activity daily).  Educational handout given for back pain  The above assessment and management plan was discussed with the patient. The patient verbalized understanding of and has agreed to the management plan. Patient is aware to call the clinic if they develop any new symptoms or if symptoms persist or worsen. Patient is aware when to return to the clinic for a follow-up visit. Patient educated on when it is appropriate to go to the emergency department.   Monia Pouch, FNP-C [facility] [phone]

## 2022-10-04 ENCOUNTER — Telehealth: Payer: Self-pay | Admitting: Family Medicine

## 2022-10-07 DIAGNOSIS — D329 Benign neoplasm of meninges, unspecified: Secondary | ICD-10-CM | POA: Diagnosis not present

## 2022-10-07 DIAGNOSIS — Z6832 Body mass index (BMI) 32.0-32.9, adult: Secondary | ICD-10-CM | POA: Diagnosis not present

## 2022-10-18 ENCOUNTER — Telehealth: Payer: Self-pay | Admitting: Family Medicine

## 2022-10-18 NOTE — Telephone Encounter (Signed)
Referral to psychiatrist is booked for 6-9 months and needs to go somewhere else. Please call patient.

## 2022-10-19 NOTE — Telephone Encounter (Signed)
Patient aware and verbalizes understanding. 

## 2022-10-20 DIAGNOSIS — R0689 Other abnormalities of breathing: Secondary | ICD-10-CM | POA: Diagnosis not present

## 2022-10-20 DIAGNOSIS — I10 Essential (primary) hypertension: Secondary | ICD-10-CM | POA: Diagnosis not present

## 2022-10-20 DIAGNOSIS — F411 Generalized anxiety disorder: Secondary | ICD-10-CM | POA: Diagnosis not present

## 2022-10-20 DIAGNOSIS — R251 Tremor, unspecified: Secondary | ICD-10-CM | POA: Diagnosis not present

## 2022-10-20 DIAGNOSIS — Z743 Need for continuous supervision: Secondary | ICD-10-CM | POA: Diagnosis not present

## 2022-10-20 DIAGNOSIS — R569 Unspecified convulsions: Secondary | ICD-10-CM | POA: Diagnosis not present

## 2022-10-20 DIAGNOSIS — D496 Neoplasm of unspecified behavior of brain: Secondary | ICD-10-CM | POA: Diagnosis not present

## 2022-10-20 DIAGNOSIS — D329 Benign neoplasm of meninges, unspecified: Secondary | ICD-10-CM | POA: Diagnosis not present

## 2022-10-20 DIAGNOSIS — I959 Hypotension, unspecified: Secondary | ICD-10-CM | POA: Diagnosis not present

## 2022-10-20 DIAGNOSIS — F17210 Nicotine dependence, cigarettes, uncomplicated: Secondary | ICD-10-CM | POA: Diagnosis not present

## 2022-10-25 ENCOUNTER — Telehealth: Payer: Self-pay

## 2022-10-25 NOTE — Telephone Encounter (Signed)
Transition Care Management Follow-up Telephone Call Date of discharge and from where: [facility] ED 10/21/2022 How have you been since you were released from the hospital? good Any questions or concerns? No  Items Reviewed: Did the pt receive and understand the discharge instructions provided? Yes  Medications obtained and verified? Yes  Other? No  Any new allergies since your discharge? Yes  Dietary orders reviewed? Yes Do you have support at home? Yes   Home Care and Equipment/Supplies: Were home health services ordered? no If so, what is the name of the agency? N/a  Has the agency set up a time to come to the patient's home? no Were any new equipment or medical supplies ordered?  No What is the name of the medical supply agency? N/a Were you able to get the supplies/equipment? no Do you have any questions related to the use of the equipment or supplies? no  Functional Questionnaire: (I = Independent and D = Dependent) ADLs: I  Bathing/Dressing- I  Meal Prep- I  Eating- I  Maintaining continence- I  Transferring/Ambulation- I  Managing Meds- I  Follow up appointments reviewed:  PCP Hospital f/u appt confirmed? Yes  Scheduled to see Darla Lesches on 10/29/2022 @ 12:30. [facility] f/u appt confirmed? No   Are transportation arrangements needed? No  If their condition worsens, is the pt aware to call PCP or go to the Emergency Dept.? Yes Was the patient provided with contact information for the PCP's office or ED? Yes Was to pt encouraged to call back with questions or concerns? Yes .lh

## 2022-10-29 ENCOUNTER — Other Ambulatory Visit (HOSPITAL_COMMUNITY)
Admission: RE | Admit: 2022-10-29 | Discharge: 2022-10-29 | Disposition: A | Discharge status: Home / Self Care | Payer: Medicaid Other | Source: Ambulatory Visit | Attending: Family Medicine | Admitting: Family Medicine

## 2022-10-29 ENCOUNTER — Ambulatory Visit (INDEPENDENT_AMBULATORY_CARE_PROVIDER_SITE_OTHER): Payer: BC Managed Care – PPO | Admitting: Family Medicine

## 2022-10-29 ENCOUNTER — Encounter: Payer: Self-pay | Admitting: Family Medicine

## 2022-10-29 ENCOUNTER — Encounter: Payer: BC Managed Care – PPO | Admitting: Family Medicine

## 2022-10-29 VITALS — BP 137/81 | HR 87 | Temp 98.1°F | Ht 63.0 in | Wt 186.6 lb

## 2022-10-29 DIAGNOSIS — Z130 Encounter for screening for diseases of the blood and blood-forming organs and certain disorders involving the immune mechanism: Secondary | ICD-10-CM | POA: Diagnosis not present

## 2022-10-29 DIAGNOSIS — Z1231 Encounter for screening mammogram for malignant neoplasm of breast: Secondary | ICD-10-CM | POA: Diagnosis not present

## 2022-10-29 DIAGNOSIS — Z1329 Encounter for screening for other suspected endocrine disorder: Secondary | ICD-10-CM | POA: Diagnosis not present

## 2022-10-29 DIAGNOSIS — Z0000 Encounter for general adult medical examination without abnormal findings: Secondary | ICD-10-CM

## 2022-10-29 DIAGNOSIS — Z124 Encounter for screening for malignant neoplasm of cervix: Secondary | ICD-10-CM | POA: Diagnosis not present

## 2022-10-29 DIAGNOSIS — E119 Type 2 diabetes mellitus without complications: Secondary | ICD-10-CM | POA: Diagnosis not present

## 2022-10-29 DIAGNOSIS — Z136 Encounter for screening for cardiovascular disorders: Secondary | ICD-10-CM | POA: Diagnosis not present

## 2022-10-29 DIAGNOSIS — Z13220 Encounter for screening for lipoid disorders: Secondary | ICD-10-CM

## 2022-10-29 LAB — BAYER DCA HB A1C WAIVED: HB A1C (BAYER DCA - WAIVED): 7 % — ABNORMAL HIGH (ref 4.8–5.6)

## 2022-10-29 NOTE — Progress Notes (Signed)
Subjective:  Patient ID: Allison Galloway, female    DOB: 1981-11-09, 41 y.o.   MRN: [id number]  Patient Care Team: Baruch Gouty, FNP as PCP - General (Family Medicine) Werner Lean, MD as PCP - Cardiology (Cardiology)   Chief Complaint:  Annual Exam   HPI: Allison Galloway is a 41 y.o. female presenting on 10/29/2022 for Annual Exam   Pt is here today for her annual physical exam. She has not had a PAP in over 10 years. Last mammogram over 1 year ago. No recent labs. Reports Hep C and HIV screening was completed when she was pregnant, will request records. She has conversion disorder and has not been able to work. Awaiting neuropsychiatry appointment. No specific complaints or concerns today.        Relevant past medical, surgical, family, and social history reviewed and updated as indicated.  Allergies and medications reviewed and updated. Data reviewed: Chart in Epic.   Past Medical History:  Diagnosis Date   Anxiety    Constipation    Conversion disorder    Depression     Past Surgical History:  Procedure Laterality Date   ARTHROSCOPIC REPAIR ACL     CESAREAN SECTION     3   KNEE SURGERY     TUBAL LIGATION      Social History   Socioeconomic History   Marital status: Single    Spouse name: Not on file   Number of children: Not on file   Years of education: Not on file   Highest education level: Not on file  Occupational History   Not on file  Tobacco Use   Smoking status: Former    Packs/day: 0.25    Types: Cigarettes    Quit date: 08/2021    Years since quitting: 1.1   Smokeless tobacco: Never  Vaping Use   Vaping Use: Former  Substance and Sexual Activity   Alcohol use: No   Drug use: No   Sexual activity: Yes    Birth control/protection: None  Other Topics Concern   Not on file  Social History Narrative   Not on file   Social Determinants of Health   Financial Resource Strain: Not on file  Food Insecurity: Not on file   Transportation Needs: Not on file  Physical Activity: Not on file  Stress: Not on file  Social Connections: Not on file  Intimate Partner Violence: Not on file    Outpatient Encounter Medications as of 10/29/2022  Medication Sig   albuterol (VENTOLIN HFA) 108 (90 Base) MCG/ACT inhaler Inhale 1-2 puffs into the lungs every 6 (six) hours as needed for wheezing or shortness of breath.   busPIRone (BUSPAR) 5 MG tablet Take 5 mg by mouth 3 (three) times daily.   ipratropium-albuterol (DUONEB) 0.5-2.5 (3) MG/3ML SOLN Take 3 mLs by nebulization every 6 (six) hours as needed (sob/wheezing).   LORazepam (ATIVAN) 0.5 MG tablet Take 0.5 mg by mouth 3 (three) times daily as needed.   [DISCONTINUED] fluticasone-salmeterol (ADVAIR HFA) 230-21 MCG/ACT inhaler Inhale 2 puffs into the lungs 2 (two) times daily.   [DISCONTINUED] methocarbamol (ROBAXIN) 500 MG tablet Take 1 tablet (500 mg total) by mouth 2 (two) times daily.   [DISCONTINUED] nystatin (MYCOSTATIN/NYSTOP) powder Apply 1 Application topically 3 (three) times daily.   [DISCONTINUED] Plecanatide (TRULANCE) 3 MG TABS Take 3 mg by mouth daily. (Patient not taking: Reported on 10/29/2022)   [DISCONTINUED] tiZANidine (ZANAFLEX) 4 MG tablet Take 1 tablet (4 mg  total) by mouth every 8 (eight) hours as needed for muscle spasms. (Patient not taking: Reported on 10/29/2022)   [DISCONTINUED] traZODone (DESYREL) 100 MG tablet Take 50 mg by mouth at bedtime. (Patient not taking: Reported on 10/29/2022)   No facility-administered encounter medications on file as of 10/29/2022.    No Known Allergies  Review of Systems  Constitutional:  Positive for activity change, appetite change and fatigue. Negative for chills, diaphoresis, fever and unexpected weight change.  HENT: Negative.    Eyes: Negative.  Negative for photophobia and visual disturbance.  Respiratory:  Negative for cough, chest tightness and shortness of breath.   Cardiovascular:  Negative for chest  pain, palpitations and leg swelling.  Gastrointestinal:  Negative for abdominal pain, blood in stool, constipation, diarrhea, nausea and vomiting.  Endocrine: Negative.  Negative for polydipsia, polyphagia and polyuria.  Genitourinary:  Negative for decreased urine volume, difficulty urinating, dyspareunia, dysuria, enuresis, flank pain, frequency, genital sores, hematuria, menstrual problem, urgency, vaginal bleeding, vaginal discharge and vaginal pain.  Musculoskeletal:  Negative for arthralgias and myalgias.  Skin: Negative.   Allergic/Immunologic: Negative.   Neurological:  Positive for tremors. Negative for dizziness, seizures, syncope, facial asymmetry, speech difficulty, weakness, light-headedness, numbness and headaches.  Hematological: Negative.   Psychiatric/Behavioral:  Negative for agitation, behavioral problems, confusion, decreased concentration, dysphoric mood, hallucinations, self-injury, sleep disturbance and suicidal ideas. The patient is nervous/anxious. The patient is not hyperactive.   All other systems reviewed and are negative.       Objective:  BP 137/81   Pulse 87   Temp 98.1 F (36.7 C) (Temporal)   Ht 5' 3" (1.6 m)   Wt 186 lb 9.6 oz (84.6 kg)   LMP 10/02/2022 (Approximate)   SpO2 94%   BMI 33.05 kg/m    Wt Readings from Last 3 Encounters:  10/29/22 186 lb 9.6 oz (84.6 kg)  10/01/22 185 lb 12.8 oz (84.3 kg)  09/23/22 180 lb (81.6 kg)    Physical Exam Vitals and nursing note reviewed. Exam conducted with a chaperone present.  Constitutional:      General: She is not in acute distress.    Appearance: Normal appearance. She is well-developed and well-groomed. She is obese. She is not ill-appearing, toxic-appearing or diaphoretic.  HENT:     Head: Normocephalic and atraumatic.     Jaw: There is normal jaw occlusion.     Right Ear: Hearing, tympanic membrane, ear canal and external ear normal.     Left Ear: Hearing, tympanic membrane, ear canal and  external ear normal.     Nose: Nose normal.     Mouth/Throat:     Lips: Pink.     Mouth: Mucous membranes are moist.     Pharynx: Oropharynx is clear. Uvula midline.  Eyes:     General: Lids are normal.     Extraocular Movements: Extraocular movements intact.     Conjunctiva/sclera: Conjunctivae normal.     Pupils: Pupils are equal, round, and reactive to light.  Neck:     Thyroid: No thyroid mass, thyromegaly or thyroid tenderness.     Vascular: No carotid bruit or JVD.     Trachea: Trachea and phonation normal.  Cardiovascular:     Rate and Rhythm: Normal rate and regular rhythm.     Chest Wall: PMI is not displaced.     Pulses: Normal pulses.     Heart sounds: Normal heart sounds. No murmur heard.    No friction rub. No gallop.  Pulmonary:       Effort: Pulmonary effort is normal. No respiratory distress.     Breath sounds: Normal breath sounds. No wheezing.  Chest:  Breasts:    Tanner Score is 5.     Breasts are symmetrical.     Right: Normal.     Left: Normal.  Abdominal:     General: Abdomen is protuberant. Bowel sounds are normal. There is no distension or abdominal bruit. There are no signs of injury.     Palpations: Abdomen is soft. There is no shifting dullness, fluid wave, hepatomegaly, splenomegaly, mass or pulsatile mass.     Tenderness: There is no abdominal tenderness. There is no right CVA tenderness or left CVA tenderness.     Hernia: No hernia is present. There is no hernia in the left inguinal area or right inguinal area.  Genitourinary:    General: Normal vulva.     Exam position: Lithotomy position.     Pubic Area: No rash.      Tanner stage (genital): 5.     Labia:        Right: No rash, tenderness, lesion or injury.        Left: No rash, tenderness, lesion or injury.      Urethra: No prolapse, urethral pain, urethral swelling or urethral lesion.     Vagina: Normal.     Cervix: Normal.     Uterus: Normal.      Adnexa: Right adnexa normal and left  adnexa normal.     Rectum: Normal.  Musculoskeletal:        General: Normal range of motion.     Cervical back: Normal range of motion and neck supple.     Right lower leg: No edema.     Left lower leg: No edema.  Lymphadenopathy:     Cervical: No cervical adenopathy.     Upper Body:     Right upper body: No supraclavicular, axillary or pectoral adenopathy.     Left upper body: No supraclavicular, axillary or pectoral adenopathy.     Lower Body: No right inguinal adenopathy. No left inguinal adenopathy.  Skin:    General: Skin is warm and dry.     Capillary Refill: Capillary refill takes less than 2 seconds.     Coloration: Skin is not cyanotic, jaundiced or pale.     Findings: No rash.  Neurological:     General: No focal deficit present.     Mental Status: She is alert and oriented to person, place, and time.     Sensory: Sensation is intact.     Motor: Tremor (all 4 extremities, coarse) present.     Coordination: Coordination is intact.     Gait: Gait abnormal (slow).     Deep Tendon Reflexes: Reflexes are normal and symmetric.     Comments: Bobbing of head, random movements of head and face  Psychiatric:        Attention and Perception: Attention and perception normal.        Mood and Affect: Mood and affect normal.        Speech: Speech normal.        Behavior: Behavior normal. Behavior is cooperative.        Thought Content: Thought content normal.        Cognition and Memory: Cognition and memory normal.        Judgment: Judgment normal.     Results for orders placed or performed in visit on 06/22/22  Beta hCG quant (ref lab)    Result Value Ref Range   hCG Quant <1 mIU/mL  FSH/LH  Result Value Ref Range   LH 2.9 mIU/mL   FSH 5.8 mIU/mL  Prolactin  Result Value Ref Range   Prolactin 11.0 4.8 - 23.3 ng/mL  Progesterone  Result Value Ref Range   Progesterone 0.2 ng/mL  Estradiol  Result Value Ref Range   Estradiol 66.5 pg/mL       Pertinent labs & imaging  results that were available during my care of the patient were reviewed by me and considered in my medical decision making.  Assessment & Plan:  Ramona was seen today for annual exam.  Diagnoses and all orders for this visit:  Annual physical exam Health maintenance discussed and updated. Labs pending. Mammogram ordered. PAP completed. Diet and exercise encouraged.  -     CBC with Differential/Platelet -     CMP14+EGFR -     Lipid panel -     Thyroid Panel With TSH -     Cytology - PAP -     MM Digital Screening; Future -     Bayer DCA Hb A1c Waived  Screening for cervical cancer -     Cytology - PAP  Encounter for screening mammogram for malignant neoplasm of breast -     MM Digital Screening; Future  Screening for lipid disorders -     Lipid panel  Screening for deficiency anemia -     CBC with Differential/Platelet  Screening for endocrine disorder -     CMP14+EGFR -     Thyroid Panel With TSH -     Bayer DCA Hb A1c Waived     Continue all other maintenance medications.  Follow up plan: Return in about 1 year (around 10/30/2023), or if symptoms worsen or fail to improve, for CPE.   Continue healthy lifestyle choices, including diet (rich in fruits, vegetables, and lean proteins, and low in salt and simple carbohydrates) and exercise (at least 30 minutes of moderate physical activity daily).  Educational handout given for health maintenance  The above assessment and management plan was discussed with the patient. The patient verbalized understanding of and has agreed to the management plan. Patient is aware to call the clinic if they develop any new symptoms or if symptoms persist or worsen. Patient is aware when to return to the clinic for a follow-up visit. Patient educated on when it is appropriate to go to the emergency department.   Michelle [name], FNP-C [facility] [phone]   

## 2022-10-30 LAB — CBC WITH DIFFERENTIAL/PLATELET
Basophils Absolute: 0 10*3/uL (ref 0.0–0.2)
Basos: 0 %
EOS (ABSOLUTE): 0.1 10*3/uL (ref 0.0–0.4)
Eos: 1 %
Hematocrit: 40 % (ref 34.0–46.6)
Hemoglobin: 13.3 g/dL (ref 11.1–15.9)
Immature Grans (Abs): 0 10*3/uL (ref 0.0–0.1)
Immature Granulocytes: 0 %
Lymphocytes Absolute: 1.9 10*3/uL (ref 0.7–3.1)
Lymphs: 22 %
MCH: 27.4 pg (ref 26.6–33.0)
MCHC: 33.3 g/dL (ref 31.5–35.7)
MCV: 82 fL (ref 79–97)
Monocytes Absolute: 0.4 10*3/uL (ref 0.1–0.9)
Monocytes: 4 %
Neutrophils Absolute: 6.2 10*3/uL (ref 1.4–7.0)
Neutrophils: 73 %
Platelets: 245 10*3/uL (ref 150–450)
RBC: 4.86 x10E6/uL (ref 3.77–5.28)
RDW: 14.1 % (ref 11.7–15.4)
WBC: 8.6 10*3/uL (ref 3.4–10.8)

## 2022-10-30 LAB — CMP14+EGFR
ALT: 27 IU/L (ref 0–32)
AST: 17 IU/L (ref 0–40)
Albumin/Globulin Ratio: 1.2 (ref 1.2–2.2)
Albumin: 3.8 g/dL — ABNORMAL LOW (ref 3.9–4.9)
Alkaline Phosphatase: 74 IU/L (ref 44–121)
BUN/Creatinine Ratio: 12 (ref 9–23)
BUN: 11 mg/dL (ref 6–24)
Bilirubin Total: <0.2 mg/dL (ref 0.0–1.2)
CO2: 19 mmol/L — ABNORMAL LOW (ref 20–29)
Calcium: 9.8 mg/dL (ref 8.7–10.2)
Chloride: 104 mmol/L (ref 96–106)
Creatinine, Ser: 0.93 mg/dL (ref 0.57–1.00)
Globulin, Total: 3.1 g/dL (ref 1.5–4.5)
Glucose: 170 mg/dL — ABNORMAL HIGH (ref 70–99)
Potassium: 4 mmol/L (ref 3.5–5.2)
Sodium: 138 mmol/L (ref 134–144)
Total Protein: 6.9 g/dL (ref 6.0–8.5)
eGFR: 79 mL/min/{1.73_m2} (ref >59)

## 2022-10-30 LAB — LIPID PANEL
Chol/HDL Ratio: 4.6 ratio — ABNORMAL HIGH (ref 0.0–4.4)
Cholesterol, Total: 171 mg/dL (ref 100–199)
HDL: 37 mg/dL — ABNORMAL LOW (ref >39)
LDL Chol Calc (NIH): 98 mg/dL (ref 0–99)
Triglycerides: 208 mg/dL — ABNORMAL HIGH (ref 0–149)
VLDL Cholesterol Cal: 36 mg/dL (ref 5–40)

## 2022-10-30 LAB — THYROID PANEL WITH TSH
Free Thyroxine Index: 2.7 (ref 1.2–4.9)
T3 Uptake Ratio: 26 % (ref 24–39)
T4, Total: 10.3 ug/dL (ref 4.5–12.0)
TSH: 0.469 u[IU]/mL (ref 0.450–4.500)

## 2022-11-05 ENCOUNTER — Ambulatory Visit (INDEPENDENT_AMBULATORY_CARE_PROVIDER_SITE_OTHER): Payer: BC Managed Care – PPO | Admitting: Family Medicine

## 2022-11-05 ENCOUNTER — Encounter: Payer: Self-pay | Admitting: Family Medicine

## 2022-11-05 VITALS — BP 133/92 | HR 83 | Temp 98.3°F | Ht 63.0 in | Wt 185.2 lb

## 2022-11-05 DIAGNOSIS — E119 Type 2 diabetes mellitus without complications: Secondary | ICD-10-CM | POA: Diagnosis not present

## 2022-11-05 MED ORDER — ASPIRIN 81 MG PO TBEC: 81.0000 mg | DELAYED_RELEASE_TABLET | Freq: Every day | ORAL | 12 refills | Status: DC

## 2022-11-05 MED ORDER — BLOOD GLUCOSE METER KIT: PACK | 0 refills | Status: AC

## 2022-11-05 MED ORDER — ENALAPRIL MALEATE 2.5 MG PO TABS: 2.5000 mg | ORAL_TABLET | Freq: Every day | ORAL | 3 refills | Status: DC

## 2022-11-05 MED ORDER — METFORMIN HCL 500 MG PO TABS: 500.0000 mg | ORAL_TABLET | Freq: Two times a day (BID) | ORAL | 3 refills | Status: DC

## 2022-11-05 NOTE — Progress Notes (Signed)
Subjective:  Patient ID: Allison Galloway, female    DOB: 1981/04/23, 41 y.o.   MRN: [id number]  Patient Care Team: Baruch Gouty, FNP as PCP - General (Family Medicine) Werner Lean, MD as PCP - Cardiology (Cardiology)   Chief Complaint:  Diabetes   HPI: Allison Galloway is a 41 y.o. female presenting on 11/05/2022 for Diabetes   Pt presents today to discuss new diabetes diagnosis and treatment options.   Diabetes She presents for her initial diabetic visit. She has type 2 diabetes mellitus. Hypoglycemia symptoms include mood changes, nervousness/anxiousness, sleepiness and tremors. Pertinent negatives for hypoglycemia include no confusion, dizziness, headaches, hunger, pallor, seizures, speech difficulty or sweats. Associated symptoms include fatigue, polydipsia and polyphagia. Pertinent negatives for diabetes include no blurred vision, no chest pain, no foot paresthesias, no foot ulcerations, no polyuria, no visual change, no weakness and no weight loss. There are no hypoglycemic complications. There are no diabetic complications. Risk factors for coronary artery disease include obesity, sedentary lifestyle and stress. When asked about current treatments, none were reported. When asked about meal planning, she reported none. She rarely participates in exercise. She does not see a podiatrist.Eye exam is not current.     Relevant past medical, surgical, family, and social history reviewed and updated as indicated.  Allergies and medications reviewed and updated. Data reviewed: Chart in Epic.   Past Medical History:  Diagnosis Date   Anxiety    Constipation    Conversion disorder    Depression     Past Surgical History:  Procedure Laterality Date   ARTHROSCOPIC REPAIR ACL     CESAREAN SECTION     3   KNEE SURGERY     TUBAL LIGATION      Social History   Socioeconomic History   Marital status: Single    Spouse name: Not on file   Number of children: Not on file    Years of education: Not on file   Highest education level: Not on file  Occupational History   Not on file  Tobacco Use   Smoking status: Former    Packs/day: 0.25    Types: Cigarettes    Quit date: 08/2021    Years since quitting: 1.1   Smokeless tobacco: Never  Vaping Use   Vaping Use: Former  Substance and Sexual Activity   Alcohol use: No   Drug use: No   Sexual activity: Yes    Birth control/protection: None  Other Topics Concern   Not on file  Social History Narrative   Not on file   Social Determinants of Health   Financial Resource Strain: Not on file  Food Insecurity: Not on file  Transportation Needs: Not on file  Physical Activity: Not on file  Stress: Not on file  Social Connections: Not on file  Intimate Partner Violence: Not on file    Outpatient Encounter Medications as of 11/05/2022  Medication Sig   albuterol (VENTOLIN HFA) 108 (90 Base) MCG/ACT inhaler Inhale 1-2 puffs into the lungs every 6 (six) hours as needed for wheezing or shortness of breath.   aspirin EC 81 MG tablet Take 1 tablet (81 mg total) by mouth daily. Swallow whole.   blood glucose meter kit and supplies Dispense based on patient and insurance preference. Use up to four times daily as directed. (FOR ICD-10 E10.9, E11.9).   enalapril (VASOTEC) 2.5 MG tablet Take 1 tablet (2.5 mg total) by mouth daily.   ipratropium-albuterol (DUONEB) 0.5-2.5 (3) MG/3ML  SOLN Take 3 mLs by nebulization every 6 (six) hours as needed (sob/wheezing).   LORazepam (ATIVAN) 0.5 MG tablet Take 0.5 mg by mouth 3 (three) times daily as needed.   metFORMIN (GLUCOPHAGE) 500 MG tablet Take 1 tablet (500 mg total) by mouth 2 (two) times daily with a meal.   No facility-administered encounter medications on file as of 11/05/2022.    No Known Allergies  Review of Systems  Constitutional:  Positive for activity change, appetite change and fatigue. Negative for chills, diaphoresis, fever, unexpected weight change and  weight loss.  Eyes:  Negative for blurred vision, photophobia and visual disturbance.  Respiratory:  Negative for cough and shortness of breath.   Cardiovascular:  Negative for chest pain, palpitations and leg swelling.  Gastrointestinal:  Negative for abdominal pain.  Endocrine: Positive for polydipsia and polyphagia. Negative for cold intolerance, heat intolerance and polyuria.  Genitourinary:  Negative for decreased urine volume and difficulty urinating.  Skin:  Negative for pallor.  Neurological:  Positive for tremors. Negative for dizziness, seizures, syncope, facial asymmetry, speech difficulty, weakness, light-headedness, numbness and headaches.  Psychiatric/Behavioral:  Negative for confusion. The patient is nervous/anxious.   All other systems reviewed and are negative.       Objective:  BP (!) 133/92   Pulse 83   Temp 98.3 F (36.8 C)   Ht _0  (1.6 m)   Wt 185 lb 3.2 oz (84 kg)   LMP 10/02/2022 (Approximate)   SpO2 97%   BMI 32.81 kg/m    Wt Readings from Last 3 Encounters:  11/05/22 185 lb 3.2 oz (84 kg)  10/29/22 186 lb 9.6 oz (84.6 kg)  10/01/22 185 lb 12.8 oz (84.3 kg)    Physical Exam Vitals and nursing note reviewed.  Constitutional:      General: She is not in acute distress.    Appearance: She is obese. She is not ill-appearing, toxic-appearing or diaphoretic.  HENT:     Head: Normocephalic and atraumatic.  Eyes:     Conjunctiva/sclera: Conjunctivae normal.     Pupils: Pupils are equal, round, and reactive to light.  Cardiovascular:     Rate and Rhythm: Normal rate and regular rhythm.     Heart sounds: Normal heart sounds.  Pulmonary:     Effort: Pulmonary effort is normal.     Breath sounds: Normal breath sounds.  Skin:    General: Skin is warm and dry.  Neurological:     General: No focal deficit present.     Mental Status: She is alert and oriented to person, place, and time.     Motor: Tremor (all extremiites, coarse) present.      Coordination: Coordination abnormal.     Gait: Gait abnormal.     Comments: Random movements of face muscles and head  Psychiatric:        Mood and Affect: Mood normal.        Behavior: Behavior normal.        Thought Content: Thought content normal.        Judgment: Judgment normal.     Results for orders placed or performed in visit on 10/29/22  CBC with Differential/Platelet  Result Value Ref Range   WBC 8.6 3.4 - 10.8 x10E3/uL   RBC 4.86 3.77 - 5.28 x10E6/uL   Hemoglobin 13.3 11.1 - 15.9 g/dL   Hematocrit 40.0 34.0 - 46.6 %   MCV 82 79 - 97 fL   MCH 27.4 26.6 - 33.0 pg  MCHC 33.3 31.5 - 35.7 g/dL   RDW 14.1 11.7 - 15.4 %   Platelets 245 150 - 450 x10E3/uL   Neutrophils 73 Not Estab. %   Lymphs 22 Not Estab. %   Monocytes 4 Not Estab. %   Eos 1 Not Estab. %   Basos 0 Not Estab. %   Neutrophils Absolute 6.2 1.4 - 7.0 x10E3/uL   Lymphocytes Absolute 1.9 0.7 - 3.1 x10E3/uL   Monocytes Absolute 0.4 0.1 - 0.9 x10E3/uL   EOS (ABSOLUTE) 0.1 0.0 - 0.4 x10E3/uL   Basophils Absolute 0.0 0.0 - 0.2 x10E3/uL   Immature Granulocytes 0 Not Estab. %   Immature Grans (Abs) 0.0 0.0 - 0.1 x10E3/uL  CMP14+EGFR  Result Value Ref Range   Glucose 170 (H) 70 - 99 mg/dL   BUN 11 6 - 24 mg/dL   Creatinine, Ser 0.93 0.57 - 1.00 mg/dL   eGFR 79 >59 mL/min/1.73   BUN/Creatinine Ratio 12 9 - 23   Sodium 138 134 - 144 mmol/L   Potassium 4.0 3.5 - 5.2 mmol/L   Chloride 104 96 - 106 mmol/L   CO2 19 (L) 20 - 29 mmol/L   Calcium 9.8 8.7 - 10.2 mg/dL   Total Protein 6.9 6.0 - 8.5 g/dL   Albumin 3.8 (L) 3.9 - 4.9 g/dL   Globulin, Total 3.1 1.5 - 4.5 g/dL   Albumin/Globulin Ratio 1.2 1.2 - 2.2   Bilirubin Total <0.2 0.0 - 1.2 mg/dL   Alkaline Phosphatase 74 44 - 121 IU/L   AST 17 0 - 40 IU/L   ALT 27 0 - 32 IU/L  Lipid panel  Result Value Ref Range   Cholesterol, Total 171 100 - 199 mg/dL   Triglycerides 208 (H) 0 - 149 mg/dL   HDL 37 (L) >39 mg/dL   VLDL Cholesterol Cal 36 5 - 40 mg/dL    LDL Chol Calc (NIH) 98 0 - 99 mg/dL   Chol/HDL Ratio 4.6 (H) 0.0 - 4.4 ratio  Thyroid Panel With TSH  Result Value Ref Range   TSH 0.469 0.450 - 4.500 uIU/mL   T4, Total 10.3 4.5 - 12.0 ug/dL   T3 Uptake Ratio 26 24 - 39 %   Free Thyroxine Index 2.7 1.2 - 4.9  Bayer DCA Hb A1c Waived  Result Value Ref Range   HB A1C (BAYER DCA - WAIVED) 7.0 (H) 4.8 - 5.6 %       Pertinent labs & imaging results that were available during my care of the patient were reviewed by me and considered in my medical decision making.  Assessment & Plan:  Elenore was seen today for diabetes.  Diagnoses and all orders for this visit:  Type 2 diabetes mellitus without complication, without long-term current use of insulin (HCC) A1C 7. New diagnosis for pt. Biggest part of today's visit was spent educating pt on new diagnosis, treatment options, and preventing end organ damage from uncontrolled diabetes. Verbalized understanding and importance of diet, exercise, and medication compliance. Will start below medications. Blood glucose monitor and supplies ordered. Pt aware to return to office if she needs help with glucometer. Will place referral to diabetic educator.  -     metFORMIN (GLUCOPHAGE) 500 MG tablet; Take 1 tablet (500 mg total) by mouth 2 (two) times daily with a meal. -     blood glucose meter kit and supplies; Dispense based on patient and insurance preference. Use up to four times daily as directed. (FOR ICD-10 E10.9, E11.9). -  aspirin EC 81 MG tablet; Take 1 tablet (81 mg total) by mouth daily. Swallow whole. -     enalapril (VASOTEC) 2.5 MG tablet; Take 1 tablet (2.5 mg total) by mouth daily.     Continue all other maintenance medications.  Follow up plan: Return in about 3 months (around 02/04/2023), or if symptoms worsen or fail to improve, for DM.   Continue healthy lifestyle choices, including diet (rich in fruits, vegetables, and lean proteins, and low in salt and simple carbohydrates) and  exercise (at least 30 minutes of moderate physical activity daily).  Educational handout given for DM educational materials   The above assessment and management plan was discussed with the patient. The patient verbalized understanding of and has agreed to the management plan. Patient is aware to call the clinic if they develop any new symptoms or if symptoms persist or worsen. Patient is aware when to return to the clinic for a follow-up visit. Patient educated on when it is appropriate to go to the emergency department.   Monia Pouch, FNP-C [facility] [phone]

## 2022-11-05 NOTE — Patient Instructions (Addendum)
Monitor your blood sugars as we discussed and record them. Bring the log to your next appointment.  Take your medications as directed.    Goal Blood glucose:    Fasting (before meals) = 80 to 130   Within 2 hours of eating = less than 180   Understanding your Hemoglobin A1c: 7.0     Diabetes Mellitus and Nutrition    I think that you would greatly benefit from seeing a nutritionist. If this is something you are interested in, please call Dr Jenne Campus at [phone] to schedule an appointment.   When you have diabetes (diabetes mellitus), it is very important to have healthy eating habits because your blood sugar (glucose) levels are greatly affected by what you eat and drink. Eating healthy foods in the appropriate amounts, at about the same times every day, can help you: Control your blood glucose. Lower your risk of heart disease. Improve your blood pressure. Reach or maintain a healthy weight.  Every person with diabetes is different, and each person has different needs for a meal plan. Your health care provider may recommend that you work with a diet and nutrition specialist (dietitian) to make a meal plan that is best for you. Your meal plan may vary depending on factors such as: The calories you need. The medicines you take. Your weight. Your blood glucose, blood pressure, and cholesterol levels. Your activity level. Other health conditions you have, such as heart or kidney disease.  How do carbohydrates affect me? Carbohydrates affect your blood glucose level more than any other type of food. Eating carbohydrates naturally increases the amount of glucose in your blood. Carbohydrate counting is a method for keeping track of how many carbohydrates you eat. Counting carbohydrates is important to keep your blood glucose at a healthy level, especially if you use insulin or take certain oral diabetes medicines. It is important to know how many carbohydrates you can safely have in  each meal. This is different for every person. Your dietitian can help you calculate how many carbohydrates you should have at each meal and for snack. Foods that contain carbohydrates include: Bread, cereal, rice, pasta, and crackers. Potatoes and corn. Peas, beans, and lentils. Milk and yogurt. Fruit and juice. Desserts, such as cakes, cookies, ice cream, and candy.  How does alcohol affect me? Alcohol can cause a sudden decrease in blood glucose (hypoglycemia), especially if you use insulin or take certain oral diabetes medicines. Hypoglycemia can be a life-threatening condition. Symptoms of hypoglycemia (sleepiness, dizziness, and confusion) are similar to symptoms of having too much alcohol. If your health care provider says that alcohol is safe for you, follow these guidelines: Limit alcohol intake to no more than 1 drink per day for nonpregnant women and 2 drinks per day for men. One drink equals 12 oz of beer, 5 oz of wine, or 1 oz of hard liquor. Do not drink on an empty stomach. Keep yourself hydrated with water, diet soda, or unsweetened iced tea. Keep in mind that regular soda, juice, and other mixers may contain a lot of sugar and must be counted as carbohydrates.  What are tips for following this plan?  Reading food labels Start by checking the serving size on the label. The amount of calories, carbohydrates, fats, and other nutrients listed on the label are based on one serving of the food. Many foods contain more than one serving per package. Check the total grams (g) of carbohydrates in one serving. You can calculate the number  of servings of carbohydrates in one serving by dividing the total carbohydrates by 15. For example, if a food has 30 g of total carbohydrates, it would be equal to 2 servings of carbohydrates. Check the number of grams (g) of saturated and trans fats in one serving. Choose foods that have low or no amount of these fats. Check the number of milligrams  (mg) of sodium in one serving. Most people should limit total sodium intake to less than 2,300 mg per day. Always check the nutrition information of foods labeled as "low-fat" or "nonfat". These foods may be higher in added sugar or refined carbohydrates and should be avoided. Talk to your dietitian to identify your daily goals for nutrients listed on the label.  Shopping Avoid buying canned, premade, or processed foods. These foods tend to be high in fat, sodium, and added sugar. Shop around the outside edge of the grocery store. This includes fresh fruits and vegetables, bulk grains, fresh meats, and fresh dairy.  Cooking Use low-heat cooking methods, such as baking, instead of high-heat cooking methods like deep frying. Cook using healthy oils, such as olive, canola, or sunflower oil. Avoid cooking with butter, cream, or high-fat meats.  Meal planning Eat meals and snacks regularly, preferably at the same times every day. Avoid going long periods of time without eating. Eat foods high in fiber, such as fresh fruits, vegetables, beans, and whole grains. Talk to your dietitian about how many servings of carbohydrates you can eat at each meal. Eat 4-6 ounces of lean protein each day, such as lean meat, chicken, fish, eggs, or tofu. 1 ounce is equal to 1 ounce of meat, chicken, or fish, 1 egg, or 1/4 cup of tofu. Eat some foods each day that contain healthy fats, such as avocado, nuts, seeds, and fish.  Lifestyle  Check your blood glucose regularly. Exercise at least 30 minutes 5 or more days each week, or as told by your health care provider. Take medicines as told by your health care provider. Do not use any products that contain nicotine or tobacco, such as cigarettes and e-cigarettes. If you need help quitting, ask your health care provider. Work with a Social worker or diabetes educator to identify strategies to manage stress and any emotional and social challenges.  What are some  questions to ask my health care provider? Do I need to meet with a diabetes educator? Do I need to meet with a dietitian? What number can I call if I have questions? When are the best times to check my blood glucose?  Where to find more information: American Diabetes Association: diabetes.org/food-and-fitness/food Academy of Nutrition and Dietetics: PokerClues.dk [employer] of Diabetes and Digestive and Kidney Diseases (NIH): ContactWire.be  Summary A healthy meal plan will help you control your blood glucose and maintain a healthy lifestyle. Working with a diet and nutrition specialist (dietitian) can help you make a meal plan that is best for you. Keep in mind that carbohydrates and alcohol have immediate effects on your blood glucose levels. It is important to count carbohydrates and to use alcohol carefully. This information is not intended to replace advice given to you by your health care provider. Make sure you discuss any questions you have with your health care provider. Document Released: 08/12/2005 Document Revised: 12/20/2016 Document Reviewed: 12/20/2016 Elsevier Interactive Patient Education  [employer].

## 2022-11-09 LAB — CYTOLOGY - PAP
Chlamydia: NEGATIVE
Comment: NEGATIVE
Comment: NEGATIVE
Comment: NEGATIVE
Comment: NEGATIVE
Comment: NORMAL
Diagnosis: NEGATIVE
HSV1: NEGATIVE
HSV2: NEGATIVE
High risk HPV: NEGATIVE
Neisseria Gonorrhea: NEGATIVE
Trichomonas: NEGATIVE

## 2022-11-29 DIAGNOSIS — N939 Abnormal uterine and vaginal bleeding, unspecified: Secondary | ICD-10-CM

## 2022-11-29 HISTORY — DX: Abnormal uterine and vaginal bleeding, unspecified: N93.9

## 2022-12-01 ENCOUNTER — Ambulatory Visit (INDEPENDENT_AMBULATORY_CARE_PROVIDER_SITE_OTHER): Payer: BC Managed Care – PPO | Admitting: Family Medicine

## 2022-12-01 ENCOUNTER — Encounter: Payer: Self-pay | Admitting: Family Medicine

## 2022-12-01 VITALS — BP 127/87 | HR 57 | Temp 97.3°F | Ht 63.0 in | Wt 188.2 lb

## 2022-12-01 DIAGNOSIS — R6889 Other general symptoms and signs: Secondary | ICD-10-CM | POA: Diagnosis not present

## 2022-12-01 DIAGNOSIS — N926 Irregular menstruation, unspecified: Secondary | ICD-10-CM | POA: Diagnosis not present

## 2022-12-01 LAB — PREGNANCY, URINE: Preg Test, Ur: NEGATIVE

## 2022-12-01 NOTE — Progress Notes (Signed)
Subjective:  Patient ID: Allison Galloway, female    DOB: 07/17/1981, 42 y.o.   MRN: [id number]  Patient Care Team: Baruch Gouty, FNP as PCP - General (Family Medicine) Werner Lean, MD as PCP - Cardiology (Cardiology)   Chief Complaint:  Amenorrhea (Last cycle was Nov 4th )   HPI: Allison Galloway is a 42 y.o. female presenting on 12/01/2022 for Amenorrhea (Last cycle was Nov 4th )   Pt presents today with complaints of misses menses. States her last cycle was 10/02/2022. She is sexually active on a regular basis. States she has not had any spotting or cramping. Does report nausea and reactions to certain smells. No other reported symptoms.      Relevant past medical, surgical, family, and social history reviewed and updated as indicated.  Allergies and medications reviewed and updated. Data reviewed: Chart in Epic.   Past Medical History:  Diagnosis Date   Anxiety    Constipation    Conversion disorder    Depression     Past Surgical History:  Procedure Laterality Date   ARTHROSCOPIC REPAIR ACL     CESAREAN SECTION     3   KNEE SURGERY     TUBAL LIGATION      Social History   Socioeconomic History   Marital status: Single    Spouse name: Not on file   Number of children: Not on file   Years of education: Not on file   Highest education level: Not on file  Occupational History   Not on file  Tobacco Use   Smoking status: Former    Packs/day: 0.25    Types: Cigarettes    Quit date: 08/2021    Years since quitting: 1.2   Smokeless tobacco: Never  Vaping Use   Vaping Use: Former  Substance and Sexual Activity   Alcohol use: No   Drug use: No   Sexual activity: Yes    Birth control/protection: None  Other Topics Concern   Not on file  Social History Narrative   Not on file   Social Determinants of Health   Financial Resource Strain: Not on file  Food Insecurity: Not on file  Transportation Needs: Not on file  Physical Activity: Not on  file  Stress: Not on file  Social Connections: Not on file  Intimate Partner Violence: Not on file    Outpatient Encounter Medications as of 12/01/2022  Medication Sig   albuterol (VENTOLIN HFA) 108 (90 Base) MCG/ACT inhaler Inhale 1-2 puffs into the lungs every 6 (six) hours as needed for wheezing or shortness of breath.   aspirin EC 81 MG tablet Take 1 tablet (81 mg total) by mouth daily. Swallow whole.   blood glucose meter kit and supplies Dispense based on patient and insurance preference. Use up to four times daily as directed. (FOR ICD-10 E10.9, E11.9).   enalapril (VASOTEC) 2.5 MG tablet Take 1 tablet (2.5 mg total) by mouth daily.   ipratropium-albuterol (DUONEB) 0.5-2.5 (3) MG/3ML SOLN Take 3 mLs by nebulization every 6 (six) hours as needed (sob/wheezing).   LORazepam (ATIVAN) 0.5 MG tablet Take 0.5 mg by mouth 3 (three) times daily as needed.   metFORMIN (GLUCOPHAGE) 500 MG tablet Take 1 tablet (500 mg total) by mouth 2 (two) times daily with a meal.   No facility-administered encounter medications on file as of 12/01/2022.    No Known Allergies  Review of Systems  Constitutional:  Positive for activity change, appetite change and  unexpected weight change. Negative for chills, fatigue and fever.  HENT: Negative.    Eyes: Negative.   Respiratory:  Negative for cough, chest tightness and shortness of breath.   Cardiovascular:  Negative for chest pain, palpitations and leg swelling.  Gastrointestinal:  Negative for blood in stool, constipation, diarrhea, nausea and vomiting.  Endocrine: Negative.   Genitourinary:  Positive for menstrual problem. Negative for dysuria, frequency and urgency.  Musculoskeletal:  Negative for arthralgias and myalgias.  Skin: Negative.   Allergic/Immunologic: Negative.   Neurological:  Positive for tremors. Negative for dizziness and headaches.  Hematological: Negative.   Psychiatric/Behavioral:  Negative for confusion, hallucinations, sleep  disturbance and suicidal ideas.   All other systems reviewed and are negative.       Objective:  BP 127/87   Pulse (!) 57   Temp (!) 97.3 F (36.3 C) (Temporal)   Ht _0  (1.6 m)   Wt 188 lb 3.2 oz (85.4 kg)   LMP 10/02/2022   SpO2 98%   BMI 33.34 kg/m    Wt Readings from Last 3 Encounters:  12/01/22 188 lb 3.2 oz (85.4 kg)  11/05/22 185 lb 3.2 oz (84 kg)  10/29/22 186 lb 9.6 oz (84.6 kg)    Physical Exam Vitals and nursing note reviewed.  Constitutional:      Appearance: Normal appearance. She is obese.  HENT:     Head: Normocephalic and atraumatic.     Mouth/Throat:     Mouth: Mucous membranes are moist.  Eyes:     Pupils: Pupils are equal, round, and reactive to light.  Cardiovascular:     Rate and Rhythm: Normal rate and regular rhythm.     Heart sounds: Normal heart sounds. No murmur heard.    No friction rub. No gallop.  Pulmonary:     Effort: Pulmonary effort is normal.     Breath sounds: Normal breath sounds.  Abdominal:     General: Bowel sounds are normal. There is no distension.     Palpations: Abdomen is soft.     Tenderness: There is no abdominal tenderness.  Musculoskeletal:     Right lower leg: No edema.     Left lower leg: No edema.  Skin:    General: Skin is warm and dry.     Capillary Refill: Capillary refill takes less than 2 seconds.  Neurological:     General: No focal deficit present.     Mental Status: She is alert and oriented to person, place, and time.  Psychiatric:        Mood and Affect: Mood normal.        Behavior: Behavior normal.        Thought Content: Thought content normal.        Judgment: Judgment normal.     Results for orders placed or performed in visit on 10/29/22  CBC with Differential/Platelet  Result Value Ref Range   WBC 8.6 3.4 - 10.8 x10E3/uL   RBC 4.86 3.77 - 5.28 x10E6/uL   Hemoglobin 13.3 11.1 - 15.9 g/dL   Hematocrit 40.0 34.0 - 46.6 %   MCV 82 79 - 97 fL   MCH 27.4 26.6 - 33.0 pg   MCHC 33.3  31.5 - 35.7 g/dL   RDW 14.1 11.7 - 15.4 %   Platelets 245 150 - 450 x10E3/uL   Neutrophils 73 Not Estab. %   Lymphs 22 Not Estab. %   Monocytes 4 Not Estab. %   Eos 1 Not Estab. %  Basos 0 Not Estab. %   Neutrophils Absolute 6.2 1.4 - 7.0 x10E3/uL   Lymphocytes Absolute 1.9 0.7 - 3.1 x10E3/uL   Monocytes Absolute 0.4 0.1 - 0.9 x10E3/uL   EOS (ABSOLUTE) 0.1 0.0 - 0.4 x10E3/uL   Basophils Absolute 0.0 0.0 - 0.2 x10E3/uL   Immature Granulocytes 0 Not Estab. %   Immature Grans (Abs) 0.0 0.0 - 0.1 x10E3/uL  CMP14+EGFR  Result Value Ref Range   Glucose 170 (H) 70 - 99 mg/dL   BUN 11 6 - 24 mg/dL   Creatinine, Ser 0.93 0.57 - 1.00 mg/dL   eGFR 79 >59 mL/min/1.73   BUN/Creatinine Ratio 12 9 - 23   Sodium 138 134 - 144 mmol/L   Potassium 4.0 3.5 - 5.2 mmol/L   Chloride 104 96 - 106 mmol/L   CO2 19 (L) 20 - 29 mmol/L   Calcium 9.8 8.7 - 10.2 mg/dL   Total Protein 6.9 6.0 - 8.5 g/dL   Albumin 3.8 (L) 3.9 - 4.9 g/dL   Globulin, Total 3.1 1.5 - 4.5 g/dL   Albumin/Globulin Ratio 1.2 1.2 - 2.2   Bilirubin Total <0.2 0.0 - 1.2 mg/dL   Alkaline Phosphatase 74 44 - 121 IU/L   AST 17 0 - 40 IU/L   ALT 27 0 - 32 IU/L  Lipid panel  Result Value Ref Range   Cholesterol, Total 171 100 - 199 mg/dL   Triglycerides 208 (H) 0 - 149 mg/dL   HDL 37 (L) >39 mg/dL   VLDL Cholesterol Cal 36 5 - 40 mg/dL   LDL Chol Calc (NIH) 98 0 - 99 mg/dL   Chol/HDL Ratio 4.6 (H) 0.0 - 4.4 ratio  Thyroid Panel With TSH  Result Value Ref Range   TSH 0.469 0.450 - 4.500 uIU/mL   T4, Total 10.3 4.5 - 12.0 ug/dL   T3 Uptake Ratio 26 24 - 39 %   Free Thyroxine Index 2.7 1.2 - 4.9  Bayer DCA Hb A1c Waived  Result Value Ref Range   HB A1C (BAYER DCA - WAIVED) 7.0 (H) 4.8 - 5.6 %  Cytology - PAP  Result Value Ref Range   High risk HPV Negative    Neisseria Gonorrhea Negative    Chlamydia Negative    Trichomonas Negative    HSV1 Negative    HSV2 Negative    Adequacy      Satisfactory for evaluation;  transformation zone component PRESENT.   Diagnosis      - Negative for intraepithelial lesion or malignancy (NILM)   Comment Normal Reference Ranger Chlamydia - Negative    Comment      Normal Reference Range Neisseria Gonorrhea - Negative   Comment Normal Reference Range Trichomonas - Negative    Comment Normal Reference Range HPV - Negative    Comment Normal Reference Range HSV - Negative        Pertinent labs & imaging results that were available during my care of the patient were reviewed by me and considered in my medical decision making.  Assessment & Plan:  Porshia was seen today for amenorrhea.  Diagnoses and all orders for this visit:  Missed menses Last cycle 10/02/2022. Urine pregnancy negative in office. Will check serum hCG and hormone panel. Further treatment pending results.  -     Pregnancy, urine -     hCG, serum, qualitative -     Hormone Panel     Continue all other maintenance medications.  Follow up plan: Return if symptoms  worsen or fail to improve.   Continue healthy lifestyle choices, including diet (rich in fruits, vegetables, and lean proteins, and low in salt and simple carbohydrates) and exercise (at least 30 minutes of moderate physical activity daily).    The above assessment and management plan was discussed with the patient. The patient verbalized understanding of and has agreed to the management plan. Patient is aware to call the clinic if they develop any new symptoms or if symptoms persist or worsen. Patient is aware when to return to the clinic for a follow-up visit. Patient educated on when it is appropriate to go to the emergency department.   Monia Pouch, FNP-C [facility] [phone]

## 2022-12-13 LAB — HORMONE PANEL (T4,TSH,FSH,TESTT,SHBG,DHEA,ETC)
DHEA-Sulfate, LCMS: 13 ug/dL
Estradiol, Serum, MS: 97 pg/mL
Estrone Sulfate: 73 ng/dL
Follicle Stimulating Hormone: 3.8 m[IU]/mL
Free T-3: 3.1 pg/mL
Free Testosterone, Serum: 2.9 pg/mL
Progesterone, Serum: <10 ng/dL
Sex Hormone Binding Globulin: 42.4 nmol/L
T4: 11.7 ug/dL
TSH: 2 uU/mL
Testosterone, Serum (Total): 22 ng/dL
Testosterone-% Free: 1.3 %
Triiodothyronine (T-3), Serum: 148 ng/dL

## 2022-12-13 LAB — HCG, SERUM, QUALITATIVE: hCG,Beta Subunit,Qual,Serum: NEGATIVE m[IU]/mL (ref <6)

## 2022-12-17 ENCOUNTER — Encounter: Payer: Medicaid Other | Attending: Family Medicine | Admitting: Dietician

## 2022-12-17 ENCOUNTER — Encounter: Payer: Self-pay | Admitting: Dietician

## 2022-12-17 VITALS — Ht 63.0 in | Wt 188.0 lb

## 2022-12-17 DIAGNOSIS — E119 Type 2 diabetes mellitus without complications: Secondary | ICD-10-CM

## 2022-12-17 NOTE — Patient Instructions (Addendum)
Aim for 150 minutes of physical activity weekly. -try a chair workout or walking! Aim to get 10-20 minutes a day at first.  Drink Goals Goal: drink 2 water bottles daily, one by lunch and one by the end of the day.  Goal: only drink 1 coke a day.  Goal: aim to make 1/2 of your plate vegetables at least 2x/day  Make simple meals at home more often than eating out.  Aim to eat within 1-2 hours of waking up and every 3-5 hours following.

## 2022-12-17 NOTE — Progress Notes (Signed)
Diabetes Self-Management Education  Visit Type: First/Initial  Appt. Start Time: 0805 Appt. End Time: 0902  12/17/2022  [name], identified by name and date of birth, is a 42 y.o. female with a diagnosis of Diabetes: Type 2.   ASSESSMENT  History includes: anxiety, depression, type 2 diabetes, conversion disorder Labs noted: 10/29/22 A1c 7% Medications include: metformin Supplements: none  Pt is present today with her husband who states he also has diabetes.  Pt reports she has seizures and balance issues due to conversion disorder which makes walking hard.   Pt get 5 hours of sleep most nights.  Pt reports she drinks 3-4 cokes daily. Pt husband reports they mostly eat fast food. Pt states they have been trying to cook at home more.   Pt reports she does not drink any water.   Height '5\' 3"'$  (1.6 m), weight 188 lb (85.3 kg), last menstrual period 10/02/2022. Body mass index is 33.3 kg/m.   Diabetes Self-Management Education - 12/17/22 0804       Visit Information   Visit Type First/Initial      Initial Visit   Diabetes Type Type 2    Date Diagnosed 10/29/22    Are you currently following a meal plan? No    Are you taking your medications as prescribed? Yes      Health Coping   How would you rate your overall health? Fair      Psychosocial Assessment   Patient Belief/Attitude about Diabetes Defeat/Burnout    What is the hardest part about your diabetes right now, causing you the most concern, or is the most worrisome to you about your diabetes?   Making healty food and beverage choices    Self-care barriers None    Self-management support Doctor's office    Other persons present Patient;Spouse/SO    Patient Concerns Nutrition/Meal planning    Special Needs None    Preferred Learning Style No preference indicated    Learning Readiness Ready    How often do you need to have someone help you when you read instructions, pamphlets, or other written materials  from your doctor or pharmacy? 1 - Never    What is the last grade level you completed in school? GED      Pre-Education Assessment   Patient understands the diabetes disease and treatment process. Needs Instruction    Patient understands incorporating nutritional management into lifestyle. Needs Instruction    Patient undertands incorporating physical activity into lifestyle. Needs Instruction    Patient understands using medications safely. Needs Instruction    Patient understands monitoring blood glucose, interpreting and using results Needs Instruction    Patient understands prevention, detection, and treatment of acute complications. Needs Instruction    Patient understands prevention, detection, and treatment of chronic complications. Needs Instruction    Patient understands how to develop strategies to address psychosocial issues. Needs Instruction    Patient understands how to develop strategies to promote health/change behavior. Needs Instruction      Complications   Last HgB A1C per patient/outside source 7 %    How often do you check your blood sugar? 1-2 times/day    Fasting Blood glucose range (mg/dL) 70-129    Have you had a dilated eye exam in the past 12 months? Yes    Have you had a dental exam in the past 12 months? No    Are you checking your feet? No      Dietary Intake   Breakfast skips  OR 9am: bacon egg and cheese biscuit with coke    Snack (morning) none    Lunch skips OR fast food OR meat loaf with pinto beans and turnip greens with coke    Snack (afternoon) none    Dinner chicken or pork with broccoli and cheese with coke    Snack (evening) none OR special k berries cereal    Beverage(s) 3-4 cans coke, coffee occasionally, no water      Activity / Exercise   Activity / Exercise Type ADL's    How many days per week do you exercise? 0    How many minutes per day do you exercise? 0    Total minutes per week of exercise 0      Patient Education   Previous  Diabetes Education No    Disease Pathophysiology Definition of diabetes, type 1 and 2, and the diagnosis of diabetes;Factors that contribute to the development of diabetes;Explored patient's options for treatment of their diabetes    Healthy Eating Role of diet in the treatment of diabetes and the relationship between the three main macronutrients and blood glucose level;Plate Method;Reviewed blood glucose goals for pre and post meals and how to evaluate the patients' food intake on their blood glucose level.;Meal timing in regards to the patients' current diabetes medication.;Information on hints to eating out and maintain blood glucose control.;Meal options for control of blood glucose level and chronic complications.    Being Active Role of exercise on diabetes management, blood pressure control and cardiac health.;Helped patient identify appropriate exercises in relation to his/her diabetes, diabetes complications and other health issue.;Identified with patient nutritional and/or medication changes necessary with exercise.    Medications Reviewed patients medication for diabetes, action, purpose, timing of dose and side effects.;Reviewed medication adjustment guidelines for hyperglycemia and sick days.    Monitoring Identified appropriate SMBG and/or A1C goals.;Daily foot exams;Yearly dilated eye exam    Acute complications Taught prevention, symptoms, and  treatment of hypoglycemia - the 15 rule.;Discussed and identified patients' prevention, symptoms, and treatment of hyperglycemia.    Chronic complications Relationship between chronic complications and blood glucose control;Assessed and discussed foot care and prevention of foot problems;Lipid levels, blood glucose control and heart disease;Identified and discussed with patient  current chronic complications;Dental care;Retinopathy and reason for yearly dilated eye exams;Nephropathy, what it is, prevention of, the use of ACE, ARB's and early detection  of through urine microalbumia.;Reviewed with patient heart disease, higher risk of, and prevention    Diabetes Stress and Support Identified and addressed patients feelings and concerns about diabetes;Worked with patient to identify barriers to care and solutions;Role of stress on diabetes    Lifestyle and Health Coping Lifestyle issues that need to be addressed for better diabetes care      Individualized Goals (developed by patient)   Nutrition General guidelines for healthy choices and portions discussed    Physical Activity Exercise 3-5 times per week;30 minutes per day    Medications take my medication as prescribed    Monitoring  Test my blood glucose as discussed    Problem Solving Eating Pattern    Reducing Risk examine blood glucose patterns;do foot checks daily;treat hypoglycemia with 15 grams of carbs if blood glucose less than '70mg'$ /dL    Health Coping Ask for help with psychological, social, or emotional issues      Post-Education Assessment   Patient understands the diabetes disease and treatment process. Comprehends key points    Patient understands incorporating nutritional management into lifestyle.  Comprehends key points    Patient undertands incorporating physical activity into lifestyle. Comprehends key points    Patient understands using medications safely. Comphrehends key points    Patient understands monitoring blood glucose, interpreting and using results Comprehends key points    Patient understands prevention, detection, and treatment of acute complications. Comprehends key points    Patient understands prevention, detection, and treatment of chronic complications. Comprehends key points    Patient understands how to develop strategies to address psychosocial issues. Comprehends key points    Patient understands how to develop strategies to promote health/change behavior. Comprehends key points      Outcomes   Expected Outcomes Demonstrated interest in learning.  Expect positive outcomes    Future DMSE 2 months    Program Status Not Completed             Individualized Plan for Diabetes Self-Management Training:   Learning Objective:  Patient will have a greater understanding of diabetes self-management. Patient education plan is to attend individual and/or group sessions per assessed needs and concerns.   Plan:   Patient Instructions  Aim for 150 minutes of physical activity weekly. -try a chair workout or walking! Aim to get 10-20 minutes a day at first.  Drink Goals Goal: drink 2 water bottles daily, one by lunch and one by the end of the day.  Goal: only drink 1 coke a day.  Goal: aim to make 1/2 of your plate vegetables at least 2x/day  Make simple meals at home more often than eating out.  Aim to eat within 1-2 hours of waking up and every 3-5 hours following.    Expected Outcomes:  Demonstrated interest in learning. Expect positive outcomes  Education material provided: ADA - How to Thrive: A Guide for Your Journey with Diabetes and Snack sheet, Meal Ideas  If problems or questions, patient to contact team via:  Phone  Future DSME appointment: 2 months

## 2023-01-07 ENCOUNTER — Telehealth: Payer: Self-pay | Admitting: Gastroenterology

## 2023-01-07 NOTE — Telephone Encounter (Signed)
Good morning Dr. Lyndel Safe,  Patient would like to transfer from [facility] to our office. She states that her husband is a patient here and she would like to be seen by the same office.   Records placed on your desk for review.  Thank you, Colletta [place]

## 2023-01-13 NOTE — Telephone Encounter (Signed)
Scheduled 3-15 with patient at 11am and she is aware of location and to come to the third floor

## 2023-01-13 NOTE — Telephone Encounter (Signed)
OK for OV- APP or mine-first available RG

## 2023-01-28 ENCOUNTER — Encounter: Payer: Self-pay | Admitting: Radiology

## 2023-01-28 ENCOUNTER — Ambulatory Visit (INDEPENDENT_AMBULATORY_CARE_PROVIDER_SITE_OTHER): Payer: BC Managed Care – PPO | Admitting: Radiology

## 2023-01-28 VITALS — BP 114/76 | Ht 62.75 in | Wt 183.0 lb

## 2023-01-28 DIAGNOSIS — N926 Irregular menstruation, unspecified: Secondary | ICD-10-CM

## 2023-01-28 DIAGNOSIS — E119 Type 2 diabetes mellitus without complications: Secondary | ICD-10-CM | POA: Diagnosis not present

## 2023-01-28 DIAGNOSIS — Z419 Encounter for procedure for purposes other than remedying health state, unspecified: Secondary | ICD-10-CM | POA: Diagnosis not present

## 2023-01-28 LAB — PREGNANCY, URINE: Preg Test, Ur: NEGATIVE

## 2023-01-28 MED ORDER — METFORMIN HCL 850 MG PO TABS: 850.0000 mg | ORAL_TABLET | Freq: Two times a day (BID) | ORAL | 2 refills | Status: DC

## 2023-01-28 NOTE — Progress Notes (Signed)
Allison Galloway July 17, 1981 [id number]   History:  42 y.o. G3P3 here as a new patient with c/o of irregular periods.  LNMP: 12/24/22 Previous MP: 10/02/22. Had irregular spotting/bleeding since LMP.  Had normal hormonal bloodwork done by PCP last month  Newly diagnosed with DM2, started metformin '500mg'$  BID in December 2023. She states her fasting BS has still been running over 120 some days and PP BS can be over 200. She denies any hypoglycemia.  BC: tubal   Pap 10/29/22 negative @ PCP  Gynecologic History Patient's last menstrual period was 12/24/2022 (exact date). Period Pattern: (!) Irregular Menstrual Flow: Light, Moderate Menstrual Control: Tampon, Maxi pad Dysmenorrhea: (!) Mild Dysmenorrhea Symptoms: Cramping Contraception/Family planning: tubal ligation Sexually active: yes  Obstetric History OB History  Gravida Para Term Preterm AB Living  '3 3 2 1   3  '$ SAB IAB Ectopic Multiple Live Births          3    # Outcome Date GA Lbr Len/2nd Weight Sex Delivery Anes PTL Lv  3 Preterm           2 Term           1 Term             Latest Reference Range & Units 12/01/22 08:45  Estradiol pg/mL 97  Estrone Sulfate ng/dL 73  Progesterone ng/dL <10  Testosterone-% Free % 1.3  Testosterone, Serum (Total) ng/dL 22  Free Testosterone, Serum pg/mL 2.9  hCG,Beta Subunit,Qual,Serum Negative <6 mIU/mL Negative  Sex Hormone Binding Globulin nmol/L 42.4  TSH uU/mL 2.0  Triiodothyronine (T-3), Serum ng/dL 148  Free T-3 pg/mL 3.1  T4 ug/dL 11.7    The following portions of the patient's history were reviewed and updated as appropriate: allergies, current medications, past family history, past medical history, past social history, past surgical history, and problem list.  Review of Systems Pertinent items noted in HPI and remainder of comprehensive ROS otherwise negative.   Past medical history, past surgical history, family history and social history were all reviewed and  documented in the EPIC chart.   Exam:  Vitals:   01/28/23 0857  BP: 114/76  Weight: 183 lb (83 kg)  Height: 5' 2.75" (1.594 m)   Body mass index is 32.68 kg/m.  General appearance:  Normal Abdominal  Soft,nontender, without masses, guarding or rebound.  Liver/spleen:  No organomegaly noted  Hernia:  None appreciated  Skin  Inspection:  Grossly normal Genitourinary   Inguinal/mons:  Normal without inguinal adenopathy  External genitalia:  Normal appearing vulva with no masses, tenderness, or lesions  BUS/Urethra/Skene's glands:  Normal without masses or exudate  Vagina:  Normal appearing with normal color and discharge, no lesions +blood, no pooling   Cervix:  Normal appearing without discharge or lesions  Uterus:  Normal in size, shape and contour.  Mobile, nontender  Adnexa/parametria:     Rt: Normal in size, without masses or tenderness.   Lt: Normal in size, without masses or tenderness.  Anus and perineum: Normal   Patient informed chaperone available to be present for breast and pelvic exam. Patient has requested no chaperone to be present. Patient has been advised what will be completed during breast and pelvic exam.   Assessment/Plan:   1. Irregular periods - Pregnancy, urine; negative - metFORMIN (GLUCOPHAGE) 850 MG tablet; Take 1 tablet (850 mg total) by mouth 2   2. Type 2 diabetes mellitus without complication, without long-term current use of insulin (HCC) Will increase  metformin in hopes if controls her BS better and regulates cycles  Pt does not want hormonal treatment at this time If bleeding profile does not improve we will plan to schedule u/s  - metFORMIN (GLUCOPHAGE) 850 MG tablet; Take 1 tablet (850 mg total) by mouth 2 (two) times daily with a meal.  Dispense: 60 tablet; Refill: 2     Rubbie Battiest B WHNP-BC 9:27 AM 01/28/2023

## 2023-02-01 ENCOUNTER — Encounter: Payer: Self-pay | Admitting: *Deleted

## 2023-02-03 ENCOUNTER — Other Ambulatory Visit: Payer: Self-pay

## 2023-02-03 DIAGNOSIS — N926 Irregular menstruation, unspecified: Secondary | ICD-10-CM

## 2023-02-03 NOTE — Telephone Encounter (Signed)
Declined hormonal management at last visit. May offer to scheduled an u/s

## 2023-02-04 ENCOUNTER — Encounter: Payer: BC Managed Care – PPO | Attending: Family Medicine | Admitting: Dietician

## 2023-02-04 ENCOUNTER — Encounter: Payer: Self-pay | Admitting: Dietician

## 2023-02-04 DIAGNOSIS — E119 Type 2 diabetes mellitus without complications: Secondary | ICD-10-CM | POA: Insufficient documentation

## 2023-02-04 NOTE — Patient Instructions (Addendum)
Previous Goals:  Try a chair workout or walking! Aim to get 10-20 minutes a day at first. - continue   Drink 2 water bottles daily, one by lunch and one by the end of the day. - met, continue to increase to 3-4  Only drink 1 coke a day. - met, continue to decrease   Aim to make 1/2 of your plate vegetables at least 2x/day - continue   Make simple meals at home more often than eating out. - continue

## 2023-02-04 NOTE — Progress Notes (Signed)
Diabetes Self-Management Education  Visit Type: Follow-up  Appt. Start Time: 0805 Appt. End Time: [id number]  02/04/2023  [name], identified by name and date of birth, is a 42 y.o. female with a diagnosis of Diabetes: Type 2.   ASSESSMENT  History includes: anxiety, depression, type 2 diabetes, conversion disorder Labs noted: 10/29/22 A1c 7% Medications include: metformin Supplements: none Wt History:  12/17/22: 188 lbs 02/04/23: 185 lbs   Pt states she has been drinking more water. She has been drinking 2 bottles daily. Pt states she only likes the taste of water from [employer].    Pt states she has been drinking less coke. She reports some days she goes without coke or she will just have 1 can.   Pt states she has been trying to cook more at the house and will usually make some sort of protein and vegetable for dinner.    Pt states she has been walking 2-3 times a week for 15 minutes.   Pt states her daughter just moved down from [place] and her daughter has been helping with cooking nutritious meals and she feels that this helps her stay on track.   Last menstrual period 12/24/2022. There is no height or weight on file to calculate BMI.   Diabetes Self-Management Education - 02/04/23 0806       Visit Information   Visit Type Follow-up      Initial Visit   Diabetes Type Type 2    Date Diagnosed 10/29/22    Are you currently following a meal plan? No    Are you taking your medications as prescribed? Yes      Health Coping   How would you rate your overall health? Good      Psychosocial Assessment   Patient Belief/Attitude about Diabetes Motivated to manage diabetes    What is the hardest part about your diabetes right now, causing you the most concern, or is the most worrisome to you about your diabetes?   Making healty food and beverage choices    Self-care barriers None    Self-management support Doctor's office    Other persons present Patient    Patient  Concerns Nutrition/Meal planning    Special Needs None    Learning Readiness Ready      Pre-Education Assessment   Patient understands the diabetes disease and treatment process. Needs Review    Patient understands incorporating nutritional management into lifestyle. Needs Review    Patient undertands incorporating physical activity into lifestyle. Needs Review    Patient understands using medications safely. Needs Review    Patient understands monitoring blood glucose, interpreting and using results Needs Review    Patient understands prevention, detection, and treatment of acute complications. Needs Review    Patient understands prevention, detection, and treatment of chronic complications. Needs Review    Patient understands how to develop strategies to address psychosocial issues. Needs Review    Patient understands how to develop strategies to promote health/change behavior. Needs Review      Complications   Last HgB A1C per patient/outside source 7 %    Fasting Blood glucose range (mg/dL) 70-129    Postprandial Blood glucose range (mg/dL) 180-200      Dietary Intake   Breakfast none OR bowl of cereal (special K berries or cinnamon toast crunch)    Snack (morning) none    Lunch taquitos    Snack (afternoon) none    Dinner protein (pork chops or chicken) with collard  greens or broccoli and cheese    Snack (evening) none    Beverage(s) 2 bottles water, 1 coke. 1-3 cups 2% milk.      Activity / Exercise   Activity / Exercise Type Light (walking / raking leaves)    How many days per week do you exercise? 3    How many minutes per day do you exercise? 15    Total minutes per week of exercise 45      Patient Education   Previous Diabetes Education Yes (please comment)    Disease Pathophysiology Definition of diabetes, type 1 and 2, and the diagnosis of diabetes;Explored patient's options for treatment of their diabetes    Healthy Eating Role of diet in the treatment of diabetes  and the relationship between the three main macronutrients and blood glucose level;Plate Method;Food label reading, portion sizes and measuring food.;Reviewed blood glucose goals for pre and post meals and how to evaluate the patients' food intake on their blood glucose level.;Meal timing in regards to the patients' current diabetes medication.;Meal options for control of blood glucose level and chronic complications.;Information on hints to eating out and maintain blood glucose control.    Being Active Helped patient identify appropriate exercises in relation to his/her diabetes, diabetes complications and other health issue.    Medications Reviewed patients medication for diabetes, action, purpose, timing of dose and side effects.    Monitoring Identified appropriate SMBG and/or A1C goals.    Chronic complications Relationship between chronic complications and blood glucose control    Diabetes Stress and Support Role of stress on diabetes;Worked with patient to identify barriers to care and solutions    Lifestyle and Health Coping Lifestyle issues that need to be addressed for better diabetes care      Individualized Goals (developed by patient)   Nutrition General guidelines for healthy choices and portions discussed    Physical Activity Exercise 3-5 times per week;30 minutes per day    Medications take my medication as prescribed    Monitoring  Test my blood glucose as discussed    Problem Solving Eating Pattern    Reducing Risk examine blood glucose patterns;do foot checks daily;treat hypoglycemia with 15 grams of carbs if blood glucose less than '70mg'$ /dL    Health Coping Ask for help with psychological, social, or emotional issues      Patient Self-Evaluation of Goals - Patient rates self as meeting previously set goals (% of time)   Nutrition 25 - 50% (sometimes)    Physical Activity 25 - 50% (sometimes)    Medications >75% (most of the time)    Monitoring 50 - 75 % (half of the time)     Problem Solving and behavior change strategies  25 - 50% (sometimes)    Reducing Risk (treating acute and chronic complications) 50 - 75 % (half of the time)    Health Coping 50 - 75 % (half of the time)      Post-Education Assessment   Patient understands the diabetes disease and treatment process. Comprehends key points    Patient understands incorporating nutritional management into lifestyle. Comprehends key points    Patient undertands incorporating physical activity into lifestyle. Comprehends key points    Patient understands using medications safely. Demonstrates understanding / competency    Patient understands monitoring blood glucose, interpreting and using results Demonstrates understanding / competency    Patient understands prevention, detection, and treatment of acute complications. Comprehends key points    Patient understands prevention, detection, and  treatment of chronic complications. Comprehends key points    Patient understands how to develop strategies to address psychosocial issues. Comprehends key points    Patient understands how to develop strategies to promote health/change behavior. Comprehends key points      Outcomes   Expected Outcomes Demonstrated interest in learning. Expect positive outcomes    Future DMSE PRN    Program Status Completed             Individualized Plan for Diabetes Self-Management Training:   Learning Objective:  Patient will have a greater understanding of diabetes self-management. Patient education plan is to attend individual and/or group sessions per assessed needs and concerns.   Plan:   Patient Instructions  Previous Goals:  Try a chair workout or walking! Aim to get 10-20 minutes a day at first. - continue   Drink 2 water bottles daily, one by lunch and one by the end of the day. - met, continue to increase to 3-4  Only drink 1 coke a day. - met, continue to decrease   Aim to make 1/2 of your plate vegetables at least  2x/day - continue   Make simple meals at home more often than eating out. - continue    Expected Outcomes:  Demonstrated interest in learning. Expect positive outcomes  Education material provided: No handouts provided on this follow-up.  If problems or questions, patient to contact team via:  Phone  Future DSME appointment: PRN

## 2023-02-10 NOTE — Progress Notes (Signed)
02/11/2023 Nixon Rulli GY:[id number] 1981-02-18  Referring provider: Baruch Gouty, FNP Primary GI doctor: Dr. Lyndel Safe ( Dr. Abbey Chatters transferred)  ASSESSMENT AND PLAN:   Chronic constipation CIC versus IBS versus some pelvic floor component. Worsening since her last child 18 years ago, some abdominal discomfort and bloating associated with it, no alarm symptoms. MiraLAX does not help, has tried Amitiza with some help in the past. Will do trial of Linzess, information given on AVS Discussed possible pelvic floor component, has follow-up with GYN with pelvic ultrasound, will discuss with her further evaluation. Patient is due for colonoscopy age 10, can consider sooner if any worsening symptoms or no improvement.  Conversion disorder Has had evaluation in the past with pulmonary, cardiology, neurosurgery and neurology. States she is doing much better, and her symptoms are slowly improving.   Patient Care Team: Baruch Gouty, FNP as PCP - General (Family Medicine) Werner Lean, MD as PCP - Cardiology (Cardiology) Kerry Dory, NP as Nurse Practitioner (Radiology) Harlen Labs, MD as Referring Physician (Optometry)  HISTORY OF PRESENT ILLNESS: 42 y.o. female with a past medical history of chronic constiapation, anxiety/depression, and others listed below presents for evaluation of IBS-C.   Notes reviewed from [facility] and someone else in [place] but she did not mesh with them: Patient's had chronic constipation trial Amitiza 24 mcg twice daily with some benefit. Patient has left facial weakness/twitching and left leg weakness, stuttering of speech.  Has had unremarkable workup with pulmonary, cardiology, neurosurgery and neurology.  Incidental small 5 mm in right mid frontal meningioma but otherwise very thorough workup without known cause.  Ultimately felt she has conversion disorder due to anxiety stress. Patient is no longer able to  work or drive. 04/07/2022 office visit with PA Lewis at [facility], given [facility] 10/29/2022 labs reviewed show no leukocytosis, no anemia normal kidney liver, did show diabetes. 11/22/223 positive cannabinoid drug screen  Has 3 girls 23, 20, and 18.  She had 3 C sections, not large children, not much constipation before her children, worsening constipation since her last child 24 years.  She has been on metformin for 3 months, and has had urge to have BM more but still feels she can not have complete BM.  Was pebbles and now more hard/soft stools.  Has BM once a week.  She has intermittent nausea between Bm's, no vomiting. She has bloating, slightly better after BM.  She has a lot of burping/gas.  She states in Nov was her last menses, then did not have another until end of Jab and that has been more ongoing/intermittent until last week she has been bleeding and has not stopped.  Going to see GYN and getting [place] the 11th.  She has stress urinary incontinence, occ dyspareunia.  Denies hematochezia,melena, GERD.  No weight loss or weight gain.  Tried husbands miralax that did not help.  She is estranged from her family so unknown family history.   She  reports that she quit smoking about 17 months ago. Her smoking use included cigarettes. She smoked an average of .25 packs per day. She has never used smokeless tobacco. She reports current drug use. Drug: Marijuana. She reports that she does not drink alcohol.  RELEVANT LABS AND IMAGING: CBC    Component Value Date/Time   WBC 8.6 10/29/2022 1215   WBC 7.8 02/02/2022 2321   RBC 4.86 10/29/2022 1215   RBC 4.76 02/02/2022 2321   HGB 13.3 10/29/2022  1215   HCT 40.0 10/29/2022 1215   PLT 245 10/29/2022 1215   MCV 82 10/29/2022 1215   MCH 27.4 10/29/2022 1215   MCH 27.9 02/02/2022 2321   MCHC 33.3 10/29/2022 1215   MCHC 33.1 02/02/2022 2321   RDW 14.1 10/29/2022 1215   LYMPHSABS 1.9 10/29/2022 1215   MONOABS 0.6 02/02/2022 2321   EOSABS 0.1  10/29/2022 1215   BASOSABS 0.0 10/29/2022 1215   Recent Labs    10/29/22 1215  HGB 13.3     CMP     Component Value Date/Time   NA 138 10/29/2022 1215   K 4.0 10/29/2022 1215   CL 104 10/29/2022 1215   CO2 19 (L) 10/29/2022 1215   GLUCOSE 170 (H) 10/29/2022 1215   GLUCOSE 131 (H) 02/02/2022 2321   BUN 11 10/29/2022 1215   CREATININE 0.93 10/29/2022 1215   CALCIUM 9.8 10/29/2022 1215   PROT 6.9 10/29/2022 1215   ALBUMIN 3.8 (L) 10/29/2022 1215   AST 17 10/29/2022 1215   ALT 27 10/29/2022 1215   ALKPHOS 74 10/29/2022 1215   BILITOT <0.2 10/29/2022 1215   GFRNONAA >60 02/02/2022 2321   GFRAA >60 07/11/2020 1926      Latest Ref Rng & Units 10/29/2022   12:15 PM 02/02/2022   11:21 PM 01/30/2022    2:13 PM  Hepatic Function  Total Protein 6.0 - 8.5 g/dL 6.9  7.1  6.3   Albumin 3.9 - 4.9 g/dL 3.8  3.7  3.5   AST 0 - 40 IU/L 17  20  22    ALT 0 - 32 IU/L 27  26  28    Alk Phosphatase 44 - 121 IU/L 74  52  48   Total Bilirubin 0.0 - 1.2 mg/dL <0.2  0.4  0.5       Current Medications:   Current Outpatient Medications (Endocrine & Metabolic):    metFORMIN (GLUCOPHAGE) 850 MG tablet, Take 1 tablet (850 mg total) by mouth 2 (two) times daily with a meal.  Current Outpatient Medications (Cardiovascular):    enalapril (VASOTEC) 2.5 MG tablet, Take 1 tablet (2.5 mg total) by mouth daily.  Current Outpatient Medications (Respiratory):    ipratropium-albuterol (DUONEB) 0.5-2.5 (3) MG/3ML SOLN, Take 3 mLs by nebulization every 6 (six) hours as needed (sob/wheezing).    Current Outpatient Medications (Other):    blood glucose meter kit and supplies, Dispense based on patient and insurance preference. Use up to four times daily as directed. (FOR ICD-10 E10.9, E11.9).   linaclotide (LINZESS) 145 MCG CAPS capsule, Take 1 capsule (145 mcg total) by mouth daily before breakfast.   linaclotide (LINZESS) 72 MCG capsule, Take 1 capsule (72 mcg total) by mouth daily before breakfast.    LORazepam (ATIVAN) 0.5 MG tablet, Take 0.5 mg by mouth 3 (three) times daily as needed.   Peppermint Oil (IBGARD) 90 MG CPCR, Take as directed.  Medical History:  Past Medical History:  Diagnosis Date   Anxiety    Constipation    Conversion disorder    Depression    Allergies: No Known Allergies   Surgical History:  She  has a past surgical history that includes Arthroscopic repair ACL; Cesarean section; Tubal ligation; and Knee surgery. Family History:  Her family history includes Anxiety disorder in an other family member; Arthritis in her mother; Asthma in her mother and sister; Bipolar disorder in an other family member; Depression in her mother, sister, and another family member; Diabetes in her mother; Hypertension in an  other family member; Learning disabilities in her mother; Mental illness in her mother and sister; Seizures in her daughter.  REVIEW OF SYSTEMS  : All other systems reviewed and negative except where noted in the History of Present Illness.  PHYSICAL EXAM: BP 120/84 (BP Location: Left Arm, Patient Position: Sitting, Cuff Size: Normal)   Pulse 91   Ht 5' 2.75" (1.594 m)   Wt 183 lb 6 oz (83.2 kg)   LMP 12/24/2022 (Exact Date)   SpO2 99%   BMI 32.74 kg/m  General Appearance: Well nourished, in no apparent distress. Head:   Normocephalic and atraumatic. Eyes:  sclerae anicteric,conjunctive pink  Respiratory: Respiratory effort normal, BS equal bilaterally without rales, rhonchi, wheezing. Cardio: RRR with no MRGs. Peripheral pulses intact.  Abdomen: Soft,  Obese ,active bowel sounds. mild tenderness in the lower abdomen. Without guarding and Without rebound. No masses. Rectal: Not evaluated Musculoskeletal: Full ROM, Normal gait. Without edema. Skin:  Dry and intact without significant lesions or rashes Neuro: Alert and  oriented x4;  Has some stuttering but no focal deficits Psych:  Cooperative. Normal mood and affect.    Vladimir Crofts, PA-C 12:17  PM

## 2023-02-11 ENCOUNTER — Ambulatory Visit (INDEPENDENT_AMBULATORY_CARE_PROVIDER_SITE_OTHER): Payer: BC Managed Care – PPO | Admitting: Physician Assistant

## 2023-02-11 ENCOUNTER — Encounter: Payer: Self-pay | Admitting: Physician Assistant

## 2023-02-11 ENCOUNTER — Ambulatory Visit (INDEPENDENT_AMBULATORY_CARE_PROVIDER_SITE_OTHER): Payer: BC Managed Care – PPO | Admitting: Family Medicine

## 2023-02-11 ENCOUNTER — Encounter: Payer: Self-pay | Admitting: Family Medicine

## 2023-02-11 VITALS — BP 120/84 | HR 91 | Ht 62.75 in | Wt 183.4 lb

## 2023-02-11 VITALS — BP 126/87 | HR 84 | Temp 98.3°F | Ht 62.75 in | Wt 184.0 lb

## 2023-02-11 DIAGNOSIS — F449 Dissociative and conversion disorder, unspecified: Secondary | ICD-10-CM

## 2023-02-11 DIAGNOSIS — K5909 Other constipation: Secondary | ICD-10-CM | POA: Diagnosis not present

## 2023-02-11 DIAGNOSIS — E119 Type 2 diabetes mellitus without complications: Secondary | ICD-10-CM | POA: Diagnosis not present

## 2023-02-11 LAB — BAYER DCA HB A1C WAIVED: HB A1C (BAYER DCA - WAIVED): 6.6 % — ABNORMAL HIGH (ref 4.8–5.6)

## 2023-02-11 MED ORDER — LINACLOTIDE 145 MCG PO CAPS: 145.0000 ug | ORAL_CAPSULE | Freq: Every day | ORAL | 0 refills | Status: DC

## 2023-02-11 MED ORDER — LINACLOTIDE 72 MCG PO CAPS: 72.0000 ug | ORAL_CAPSULE | Freq: Every day | ORAL | 0 refills | Status: DC

## 2023-02-11 MED ORDER — IBGARD 90 MG PO CPCR: ORAL_CAPSULE | ORAL | 0 refills | Status: DC

## 2023-02-11 NOTE — Patient Instructions (Addendum)
First do a trial off milk/lactose products if you use them. Or try lactate Increase activity Can do trial of IBGard which is over the counter for AB pain- Take 1-2 capsules once a day for maintence or twice a day during a flare  Linzess 72 mcg and increase to 145 mcg *IBS-C patients may begin to experience relief from belly pain and overall abdominal symptoms (pain, discomfort, and bloating) in about 1 week,  with symptoms typically improving over 12 weeks.  Take at least 30 minutes before the first meal of the day on an empty stomach You can have a loose stool if you eat a high-fat breakfast. Give it at least 7 days, may have more bowel movements during that time.   The diarrhea should go away and you should start having normal, complete, full bowel movements.  It may be helpful to start treatment when you can be near the comfort of your own bathroom, such as a weekend.  After you are out we can send in a prescription if you did well, there is a prescription card  Here some information about pelvic floor dysfunction. This may be contributing to some of your symptoms. We could also refer to pelvic floor physical therapy. And talk with your GYN   Pelvic Floor Dysfunction, Female Pelvic floor dysfunction (PFD) is a condition that results when the group of muscles and connective tissues that support the organs in the pelvis (pelvic floor muscles) do not work well. These muscles and their connections form a sling that supports the colon and bladder. In women, they also support the uterus. PFD causes pelvic floor muscles to be too weak, too tight, or both. In PFD, muscle movements are not coordinated. This may cause bowel or bladder problems. It may also cause pain. What are the causes? This condition may be caused by an injury to the pelvic area or by a weakening of pelvic muscles. This often results from pregnancy and childbirth or other types of strain. In many cases, the exact cause is not  known. What increases the risk? The following factors may make you more likely to develop this condition: Having chronic bladder tissue inflammation (interstitial cystitis). Being an older person. Being overweight. History of radiation treatment for cancer in the pelvic region. Previous pelvic surgery, such as removal of the uterus (hysterectomy). What are the signs or symptoms? Symptoms of this condition vary and may include: Bladder symptoms, such as: Trouble starting urination and emptying the bladder. Frequent urinary tract infections. Leaking urine when coughing, laughing, or exercising (stress incontinence). Having to pass urine urgently or frequently. Pain when passing urine. Bowel symptoms, such as: Constipation. Urgent or frequent bowel movements. Incomplete bowel movements. Painful bowel movements. Leaking stool or gas. Unexplained genital or rectal pain. Genital or rectal muscle spasms. Low back pain. Other symptoms may include: A heavy, full, or aching feeling in the vagina. A bulge that protrudes into the vagina. Pain during or after sex. How is this diagnosed? This condition may be diagnosed based on: Your symptoms and medical history. A physical exam. During the exam, your health care provider may check your pelvic muscles for tightness, spasm, pain, or weakness. This may include a rectal exam and a pelvic exam. In some cases, you may have diagnostic tests, such as: Electrical muscle function tests. Urine flow testing. X-ray tests of bowel function. Ultrasound of the pelvic organs. How is this treated? Treatment for this condition depends on the symptoms. Treatment options include: Physical therapy. This  may include Kegel exercises to help relax or strengthen the pelvic floor muscles. Biofeedback. This type of therapy provides feedback on how tight your pelvic floor muscles are so that you can learn to control them. Internal or external massage therapy. A  treatment that involves electrical stimulation of the pelvic floor muscles to help control pain (transcutaneous electrical nerve stimulation, or TENS). Sound wave therapy (ultrasound) to reduce muscle spasms. Medicines, such as: Muscle relaxants. Bladder control medicines. Surgery to reconstruct or support pelvic floor muscles may be an option if other treatments do not help. Follow these instructions at home: Activity Do your usual activities as told by your health care provider. Ask your health care provider if you should modify any activities. Do pelvic floor strengthening or relaxing exercises at home as told by your physical therapist. Lifestyle Maintain a healthy weight. Eat foods that are high in fiber, such as beans, whole grains, and fresh fruits and vegetables. Limit foods that are high in fat and processed sugars, such as fried or sweet foods. Manage stress with relaxation techniques such as yoga or meditation. General instructions If you have problems with leakage: Use absorbable pads or wear padded underwear. Wash frequently with mild soap. Keep your genital and anal area as clean and dry as possible. Ask your health care provider if you should try a barrier cream to prevent skin irritation. Take warm baths to relieve pelvic muscle tension or spasms. Take over-the-counter and prescription medicines only as told by your health care provider. Keep all follow-up visits. How is this prevented? The cause of PFD is not always known, but there are a few things you can do to reduce the risk of developing this condition, including: Staying at a healthy weight. Getting regular exercise. Managing stress. Contact a health care provider if: Your symptoms are not improving with home care. You have signs or symptoms of PFD that get worse at home. You develop new signs or symptoms. You have signs of a urinary tract infection, such as: Fever. Chills. Increased urinary frequency. A  burning feeling when urinating. You have not had a bowel movement in 3 days (constipation). Summary Pelvic floor dysfunction results when the muscles and connective tissues in your pelvic floor do not work well. These muscles and their connections form a sling that supports your colon and bladder. In women, they also support the uterus. PFD may be caused by an injury to the pelvic area or by a weakening of pelvic muscles. PFD causes pelvic floor muscles to be too weak, too tight, or a combination of both. Symptoms may vary from person to person. In most cases, PFD can be treated with physical therapies and medicines. Surgery may be an option if other treatments do not help. This information is not intended to replace advice given to you by your health care provider. Make sure you discuss any questions you have with your health care provider. Document Revised: 03/25/2021 Document Reviewed: 03/25/2021 Elsevier Patient Education  [employer].  Please try low FODMAP diet- see below- start with eliminating just one column at a time, the table at the very bottom contains foods that are safe to take   FODMAP stands for fermentable oligo-, di-, mono-saccharides and polyols (1). These are the scientific terms used to classify groups of carbs that are notorious for triggering digestive symptoms like bloating, gas and stomach pain.    I appreciate the opportunity to care for you. Vicie Mutters, PA-C

## 2023-02-11 NOTE — Patient Instructions (Addendum)

## 2023-02-11 NOTE — Progress Notes (Signed)
Subjective:  Patient ID: Allison Galloway, female    DOB: [birth year], 42 y.o.   MRN: [id number]  Patient Care Team: Baruch Gouty, FNP as PCP - General (Family Medicine) Werner Lean, MD as PCP - Cardiology (Cardiology) Kerry Dory, NP as Nurse Practitioner (Radiology) Harlen Labs, MD as Referring Physician (Optometry)   Chief Complaint:  Diabetes (3 month follow up )   HPI: Allison Galloway is a 42 y.o. female presenting on 02/11/2023 for Diabetes (3 month follow up )   Diabetes She presents for her follow-up diabetic visit. She has type 2 diabetes mellitus. Her disease course has been stable. Pertinent negatives for hypoglycemia include no confusion, dizziness, headaches, hunger, mood changes, nervousness/anxiousness, pallor, seizures, sleepiness, speech difficulty, sweats or tremors. Pertinent negatives for diabetes include no blurred vision, no chest pain, no fatigue, no foot paresthesias, no foot ulcerations, no polydipsia, no polyphagia, no polyuria, no visual change, no weakness and no weight loss. There are no hypoglycemic complications. There are no diabetic complications. Risk factors for coronary artery disease include diabetes mellitus and obesity. Current diabetic treatment includes oral agent (monotherapy). She is compliant with treatment all of the time. She is following a diabetic diet. She rarely participates in exercise. An ACE inhibitor/angiotensin II receptor blocker is being taken.      Relevant past medical, surgical, family, and social history reviewed and updated as indicated.  Allergies and medications reviewed and updated. Data reviewed: Chart in Epic.   Past Medical History:  Diagnosis Date   Anxiety    Constipation    Conversion disorder    Depression     Past Surgical History:  Procedure Laterality Date   ARTHROSCOPIC REPAIR ACL     CESAREAN SECTION     3   KNEE SURGERY     TUBAL LIGATION      Social History   Socioeconomic  History   Marital status: Single    Spouse name: Not on file   Number of children: Not on file   Years of education: Not on file   Highest education level: Not on file  Occupational History   Not on file  Tobacco Use   Smoking status: Former    Packs/day: .25    Types: Cigarettes    Quit date: 08/2021    Years since quitting: 1.4   Smokeless tobacco: Never  Vaping Use   Vaping Use: Former  Substance and Sexual Activity   Alcohol use: No   Drug use: Yes    Types: Marijuana   Sexual activity: Yes    Partners: Male    Birth control/protection: Surgical    Comment: tubal, menarche 42yo, sexual debut 42yo  Other Topics Concern   Not on file  Social History Narrative   Not on file   Social Determinants of Health   Financial Resource Strain: Not on file  Food Insecurity: Not on file  Transportation Needs: Not on file  Physical Activity: Not on file  Stress: Not on file  Social Connections: Not on file  Intimate Partner Violence: Not on file    Outpatient Encounter Medications as of 02/11/2023  Medication Sig   blood glucose meter kit and supplies Dispense based on patient and insurance preference. Use up to four times daily as directed. (FOR ICD-10 E10.9, E11.9).   enalapril (VASOTEC) 2.5 MG tablet Take 1 tablet (2.5 mg total) by mouth daily.   ipratropium-albuterol (DUONEB) 0.5-2.5 (3) MG/3ML SOLN Take 3 mLs by nebulization  every 6 (six) hours as needed (sob/wheezing).   LORazepam (ATIVAN) 0.5 MG tablet Take 0.5 mg by mouth 3 (three) times daily as needed.   metFORMIN (GLUCOPHAGE) 850 MG tablet Take 1 tablet (850 mg total) by mouth 2 (two) times daily with a meal.   No facility-administered encounter medications on file as of 02/11/2023.    No Known Allergies  Review of Systems  Constitutional:  Negative for activity change, appetite change, chills, diaphoresis, fatigue, fever, unexpected weight change and weight loss.  HENT: Negative.    Eyes: Negative.  Negative for  blurred vision, photophobia and visual disturbance.  Respiratory:  Negative for cough, chest tightness and shortness of breath.   Cardiovascular:  Negative for chest pain, palpitations and leg swelling.  Gastrointestinal:  Negative for blood in stool, constipation, diarrhea, nausea and vomiting.  Endocrine: Negative.  Negative for polydipsia, polyphagia and polyuria.  Genitourinary:  Positive for vaginal bleeding (seeing GYN). Negative for decreased urine volume, difficulty urinating, dysuria, frequency, urgency, vaginal discharge and vaginal pain.  Musculoskeletal:  Negative for arthralgias and myalgias.  Skin: Negative.  Negative for pallor.  Allergic/Immunologic: Negative.   Neurological:  Negative for dizziness, tremors, seizures, speech difficulty, weakness and headaches.  Hematological: Negative.   Psychiatric/Behavioral:  Negative for confusion, hallucinations, sleep disturbance and suicidal ideas. The patient is not nervous/anxious.   All other systems reviewed and are negative.       Objective:  BP 126/87   Pulse 84   Temp 98.3 F (36.8 C) (Temporal)   Ht 5' 2.75" (1.594 m)   Wt 184 lb (83.5 kg)   LMP 12/24/2022 (Exact Date)   SpO2 98%   BMI 32.85 kg/m    Wt Readings from Last 3 Encounters:  02/11/23 183 lb 6 oz (83.2 kg)  02/11/23 184 lb (83.5 kg)  01/28/23 183 lb (83 kg)    Physical Exam Vitals and nursing note reviewed.  Constitutional:      General: She is not in acute distress.    Appearance: Normal appearance. She is well-developed and well-groomed. She is obese. She is not ill-appearing, toxic-appearing or diaphoretic.  HENT:     Head: Normocephalic and atraumatic.     Jaw: There is normal jaw occlusion.     Right Ear: Hearing normal.     Left Ear: Hearing normal.     Nose: Nose normal.     Mouth/Throat:     Lips: Pink.     Mouth: Mucous membranes are moist.     Pharynx: Oropharynx is clear. Uvula midline.  Eyes:     General: Lids are normal.      Extraocular Movements: Extraocular movements intact.     Conjunctiva/sclera: Conjunctivae normal.     Pupils: Pupils are equal, round, and reactive to light.  Neck:     Thyroid: No thyroid mass, thyromegaly or thyroid tenderness.     Vascular: No carotid bruit or JVD.     Trachea: Trachea and phonation normal.  Cardiovascular:     Rate and Rhythm: Normal rate and regular rhythm.     Chest Wall: PMI is not displaced.     Pulses: Normal pulses.     Heart sounds: Normal heart sounds. No murmur heard.    No friction rub. No gallop.  Pulmonary:     Effort: Pulmonary effort is normal. No respiratory distress.     Breath sounds: Normal breath sounds. No wheezing.  Abdominal:     General: Bowel sounds are normal. There is no distension  or abdominal bruit.     Palpations: Abdomen is soft. There is no hepatomegaly or splenomegaly.     Tenderness: There is no abdominal tenderness. There is no right CVA tenderness or left CVA tenderness.     Hernia: No hernia is present.  Musculoskeletal:        General: Normal range of motion.     Cervical back: Normal range of motion and neck supple.     Right lower leg: No edema.     Left lower leg: No edema.  Lymphadenopathy:     Cervical: No cervical adenopathy.  Skin:    General: Skin is warm and dry.     Capillary Refill: Capillary refill takes less than 2 seconds.     Coloration: Skin is not cyanotic, jaundiced or pale.     Findings: No rash.  Neurological:     General: No focal deficit present.     Mental Status: She is alert and oriented to person, place, and time.     Sensory: Sensation is intact.     Motor: Motor function is intact.     Coordination: Coordination is intact.     Gait: Gait is intact.     Deep Tendon Reflexes: Reflexes are normal and symmetric.  Psychiatric:        Attention and Perception: Attention and perception normal.        Mood and Affect: Mood and affect normal.        Speech: Speech normal.        Behavior:  Behavior normal. Behavior is cooperative.        Thought Content: Thought content normal.        Cognition and Memory: Cognition and memory normal.        Judgment: Judgment normal.     Results for orders placed or performed in visit on 01/28/23  Pregnancy, urine  Result Value Ref Range   Preg Test, Ur NEGATIVE NEGATIVE       Pertinent labs & imaging results that were available during my care of the patient were reviewed by me and considered in my medical decision making.  Assessment & Plan:  Chenika was seen today for diabetes.  Diagnoses and all orders for this visit:  Type 2 diabetes mellitus without complication, without long-term current use of insulin (HCC) A1C 6.6. Has done well with medications. Other labs pending. Diet and exercise encouraged. Follow up in 3 months.  -     Lipid panel -     Bayer DCA Hb A1c Waived -     CMP14+EGFR -     Microalbumin / creatinine urine ratio     Continue all other maintenance medications.  Follow up plan: Return in about 3 months (around 05/14/2023), or if symptoms worsen or fail to improve, for DM.   Continue healthy lifestyle choices, including diet (rich in fruits, vegetables, and lean proteins, and low in salt and simple carbohydrates) and exercise (at least 30 minutes of moderate physical activity daily).  Educational handout given for DM  The above assessment and management plan was discussed with the patient. The patient verbalized understanding of and has agreed to the management plan. Patient is aware to call the clinic if they develop any new symptoms or if symptoms persist or worsen. Patient is aware when to return to the clinic for a follow-up visit. Patient educated on when it is appropriate to go to the emergency department.   Monia Pouch, FNP-C [facility] [phone]

## 2023-02-12 LAB — CMP14+EGFR
ALT: 34 IU/L — ABNORMAL HIGH (ref 0–32)
AST: 24 IU/L (ref 0–40)
Albumin/Globulin Ratio: 1.3 (ref 1.2–2.2)
Albumin: 4 g/dL (ref 3.9–4.9)
Alkaline Phosphatase: 79 IU/L (ref 44–121)
BUN/Creatinine Ratio: 11 (ref 9–23)
BUN: 10 mg/dL (ref 6–24)
Bilirubin Total: <0.2 mg/dL (ref 0.0–1.2)
CO2: 20 mmol/L (ref 20–29)
Calcium: 9.4 mg/dL (ref 8.7–10.2)
Chloride: 105 mmol/L (ref 96–106)
Creatinine, Ser: 0.95 mg/dL (ref 0.57–1.00)
Globulin, Total: 3 g/dL (ref 1.5–4.5)
Glucose: 133 mg/dL — ABNORMAL HIGH (ref 70–99)
Potassium: 4.7 mmol/L (ref 3.5–5.2)
Sodium: 141 mmol/L (ref 134–144)
Total Protein: 7 g/dL (ref 6.0–8.5)
eGFR: 77 mL/min/{1.73_m2} (ref >59)

## 2023-02-12 LAB — LIPID PANEL
Chol/HDL Ratio: 4.9 ratio — ABNORMAL HIGH (ref 0.0–4.4)
Cholesterol, Total: 170 mg/dL (ref 100–199)
HDL: 35 mg/dL — ABNORMAL LOW (ref >39)
LDL Chol Calc (NIH): 97 mg/dL (ref 0–99)
Triglycerides: 219 mg/dL — ABNORMAL HIGH (ref 0–149)
VLDL Cholesterol Cal: 38 mg/dL (ref 5–40)

## 2023-02-13 LAB — MICROALBUMIN / CREATININE URINE RATIO
Creatinine, Urine: 335.4 mg/dL
Microalb/Creat Ratio: 19 mg/g creat (ref 0–29)
Microalbumin, Urine: 64.5 ug/mL

## 2023-02-20 NOTE — Progress Notes (Signed)
Agree with assessment/plan.  Raj Jeramine Delis, MD  GI [phone]  

## 2023-02-28 DIAGNOSIS — Z419 Encounter for procedure for purposes other than remedying health state, unspecified: Secondary | ICD-10-CM | POA: Diagnosis not present

## 2023-03-02 MED ORDER — LINACLOTIDE 145 MCG PO CAPS: 145.0000 ug | ORAL_CAPSULE | Freq: Every day | ORAL | 3 refills | Status: DC

## 2023-03-10 ENCOUNTER — Ambulatory Visit: Payer: BC Managed Care – PPO | Admitting: Radiology

## 2023-03-10 ENCOUNTER — Ambulatory Visit (INDEPENDENT_AMBULATORY_CARE_PROVIDER_SITE_OTHER): Payer: BC Managed Care – PPO

## 2023-03-10 ENCOUNTER — Encounter: Payer: Self-pay | Admitting: Radiology

## 2023-03-10 VITALS — BP 130/82

## 2023-03-10 DIAGNOSIS — N926 Irregular menstruation, unspecified: Secondary | ICD-10-CM

## 2023-03-10 DIAGNOSIS — R9389 Abnormal findings on diagnostic imaging of other specified body structures: Secondary | ICD-10-CM | POA: Diagnosis not present

## 2023-03-10 NOTE — Progress Notes (Signed)
   Allison Galloway June 03, 1981 [id number]   History:  42 y.o. G3P3 here for follow up u/s for irregular bleeding.  She presents as a new patient with c/o of irregular periods.  Had normal hormonal bloodwork done by PCP 12/01/22 WNL Declined hormonal management at her last visit, was open to increasing her metformin  Newly diagnosed with DM2, started metformin 500mg  BID in December 2023. She states her fasting BS has still been running over 120 some days and PP BS can be over 200. She denies any hypoglycemia.  BC: tubal   Pap 10/29/22 negative @ PCP  Gynecologic History Patient's last menstrual period was 12/24/2022 (exact date). Period Pattern: (!) Irregular Menstrual Flow: Light, Moderate Menstrual Control: Tampon, Maxi pad Dysmenorrhea: (!) Mild Dysmenorrhea Symptoms: Cramping Contraception/Family planning: tubal ligation Sexually active: yes  Obstetric History OB History  Gravida Para Term Preterm AB Living  3 3 2 1   3   SAB IAB Ectopic Multiple Live Births          3    # Outcome Date GA Lbr Len/2nd Weight Sex Delivery Anes PTL Lv  3 Preterm           2 Term           1 Term             Latest Reference Range & Units 12/01/22 08:45  Estradiol pg/mL 97  Estrone Sulfate ng/dL 73  Progesterone ng/dL <34  Testosterone-% Free % 1.3  Testosterone, Serum (Total) ng/dL 22  Free Testosterone, Serum pg/mL 2.9  hCG,Beta Subunit,Qual,Serum Negative <6 mIU/mL Negative  Sex Hormone Binding Globulin nmol/L 42.4  TSH uU/mL 2.0  Triiodothyronine (T-3), Serum ng/dL 917  Free T-3 pg/mL 3.1  T4 ug/dL 91.5    The following portions of the patient's history were reviewed and updated as appropriate: allergies, current medications, past family history, past medical history, past social history, past surgical history, and problem list.  Review of Systems Pertinent items noted in HPI and remainder of comprehensive ROS otherwise negative.   Past medical history, past surgical history,  family history and social history were all reviewed and documented in the EPIC chart.   Exam:  Vitals:   01/28/23 0857  BP: 114/76  Weight: 183 lb (83 kg)  Height: 5' 2.75" (1.594 m)   Body mass index is 32.68 kg/m.  Narrative & Impression  Indications:Irregular periods   Vaginal u/s Anteverted uterus normal size and shape No myometrial massed Endometrium thickened  2.5cm with vascular and cystic areas   Right ovary simple follicle noted Left ovary simple avascular cyst 54mm   No adnexal masses No free fluid   Impression: thickened endometrium with vasulcarity    Assessment/Plan:   1. Irregular bleeding - [place] Sonohysterogram; Future  2. Thickened endometrium  - Counseled regarding need for EMB that day, questions answered of patient and her mother.  Tanda Rockers WHNP-BC 9:27 AM 01/28/2023

## 2023-03-15 ENCOUNTER — Telehealth: Payer: Self-pay | Admitting: *Deleted

## 2023-03-15 DIAGNOSIS — R9389 Abnormal findings on diagnostic imaging of other specified body structures: Secondary | ICD-10-CM

## 2023-03-15 DIAGNOSIS — N926 Irregular menstruation, unspecified: Secondary | ICD-10-CM

## 2023-03-15 NOTE — Progress Notes (Unsigned)
GYNECOLOGY  VISIT   HPI: 42 y.o.   Single  Caucasian  female   [phone] with No LMP recorded.   here for  endometrial biopsy. Patient's mother is present for the discussion portion of the visit.   Patient has had irregular menstrual bleeding.   One year ago, skipped her cycle for 2 months.  Saw her PCP for evaluation.  Her last normal period was 10/02/22.  No menses until the end of January, 2024, at which time continuous bleeding started. Can be heavy or light.   Skipped 2 weeks in March, but otherwise having bleeding.   Patient saw Arlie Solomons, [facility] on 03/10/23 for a pelvic US which showed thickened endometrium 2.5 cm with vascular and cystic changes. No myometrial masses. Simple left ovarian cyst, 3.1 cm.  Right ovarian follicle noted.    Hx C/S x 3.  Three daughters.  UPT - negative.   GYNECOLOGIC HISTORY: No LMP recorded. 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/01/2022   Conversion disorder 04/26/2022   SOB (shortness of breath) 02/26/2022   Anxiety 02/26/2022   History of tobacco abuse 02/26/2022   Moderate episode of recurrent major depressive disorder 12/10/2021   Generalized anxiety disorder 12/10/2021   Chronic constipation 12/10/2021    Past Medical History:  Diagnosis Date   Anxiety    Constipation    Conversion disorder    Depression     Past Surgical History:   Procedure Laterality Date   ARTHROSCOPIC REPAIR ACL     CESAREAN SECTION     3   KNEE SURGERY     TUBAL LIGATION      Current Outpatient Medications  Medication Sig Dispense Refill   blood glucose meter kit and supplies Dispense based on patient and insurance preference. Use up to four times daily as directed. (FOR ICD-10 E10.9, E11.9). 1 each 0   enalapril (VASOTEC) 2.5 MG tablet Take 1 tablet (2.5 mg total) by mouth daily. 90 tablet 3   ipratropium-albuterol (DUONEB) 0.5-2.5 (3) MG/3ML SOLN Take 3 mLs by nebulization every 6 (six) hours as needed (sob/wheezing).     linaclotide (LINZESS) 145 MCG CAPS capsule Take 1 capsule (145 mcg total) by mouth daily before breakfast. 90 capsule 3   LORazepam (ATIVAN) 0.5 MG tablet Take 0.5 mg by mouth 3 (three) times daily as needed.     metFORMIN (GLUCOPHAGE) 850 MG tablet Take 1 tablet (850 mg total) by mouth 2 (two) times daily with a meal. 60 tablet 2   Peppermint Oil (IBGARD) 90 MG CPCR Take as directed. 16 capsule 0  No current facility-administered medications for this visit.     ALLERGIES: Patient has no known allergies.  Family History  Problem Relation Age of Onset   Diabetes Mother    Depression Mother    Arthritis Mother    Asthma Mother    Learning disabilities Mother    Mental illness Mother    Depression Sister    Asthma Sister    Mental illness Sister    Seizures Daughter    Hypertension Other    Bipolar disorder Other    Anxiety disorder Other    Depression Other     Social History   Socioeconomic History   Marital status: Single    Spouse name: Not on file   Number of children: Not on file   Years of education: Not on file   Highest education level: Not on file  Occupational History   Not on file  Tobacco Use   Smoking status: Former    Packs/day: .25    Types: Cigarettes    Quit date: 08/2021    Years since quitting: 1.5   Smokeless tobacco: Never  Vaping Use   Vaping Use: Former  Substance and  Sexual Activity   Alcohol use: No   Drug use: Yes    Types: Marijuana   Sexual activity: Yes    Partners: Male    Birth control/protection: Surgical    Comment: tubal, menarche 42yo, sexual debut 42yo  Other Topics Concern   Not on file  Social History Narrative   Not on file   Social Determinants of Health   Financial Resource Strain: Not on file  Food Insecurity: Not on file  Transportation Needs: Not on file  Physical Activity: Not on file  Stress: Not on file  Social Connections: Not on file  Intimate Partner Violence: Not on file    Review of Systems  All other systems reviewed and are negative.   PHYSICAL EXAMINATION:    BP 134/82 (BP Location: Right Arm, Patient Position: Sitting, Cuff Size: Normal)   Pulse 90   Ht 5' 2.75" (1.594 m)   Wt 177 lb (80.3 kg)   SpO2 97%   BMI 31.60 kg/m     General appearance: alert, cooperative and appears stated age  Pelvic: External genitalia:  no lesions              Urethra:  normal appearing urethra with no masses, tenderness or lesions              Bartholins and Skenes: normal                 Vagina: normal appearing vagina with normal color and discharge, no lesions              Cervix: no lesions                Bimanual Exam:  Uterus:  normal size, contour, position, consistency, mobility, non-tender              Adnexa: no mass, fullness, tenderness      EMB: Consent for procedure. Sterile prep with   Paracervical block with 1% lidocaine 6 cc, lot number  [id number], expiration 05/2025 Tenaculum to anterior cervical lip. Os finder used.  Pipelle passed to 8 cm twice.   Tissue to pathology.  Minimal EBL. No complications.   Chaperone was present for exam:  Warren Lacy, CMA  ASSESSMENT  Irregular vaginal bleeding.  Thickened endometrium.   PLAN  Pelvic ultrasound  images and report reviewed.  Potential etiologies of the bleeding and thickened endometrium discussed:  polyp, fibroid (less likely), hyperplasia,  cancer.  FU EMB result.  Return in 7 - 8 days to discuss result and treatment options.

## 2023-03-15 NOTE — Telephone Encounter (Signed)
Reviewed plan of care with Dr. Edward Jolly.  Dr. Edward Jolly is recommending patient come in for EMB. Dx: irregular bleeding and thickened endometrium. Will keep [facility] as scheduled for 05/05/23, will make additional plans once EMB completed and resulted.   Spoke with patient, advised as seen above. Patient agreeable to schedule. Procedure scheduled for 4/17 at 12pm. Advised to take Motrin 800 mg with food and water one hour before procedure.  EMB order placed. Business office notified.   Routing to provider for final review. Patient is agreeable to disposition. Will close encounter.

## 2023-03-16 ENCOUNTER — Other Ambulatory Visit (HOSPITAL_COMMUNITY)
Admission: RE | Admit: 2023-03-16 | Discharge: 2023-03-16 | Disposition: A | Discharge status: Home / Self Care | Payer: BC Managed Care – PPO | Source: Ambulatory Visit | Attending: Obstetrics and Gynecology | Admitting: Obstetrics and Gynecology

## 2023-03-16 ENCOUNTER — Ambulatory Visit: Payer: BC Managed Care – PPO | Admitting: Obstetrics and Gynecology

## 2023-03-16 ENCOUNTER — Encounter: Payer: Self-pay | Admitting: Obstetrics and Gynecology

## 2023-03-16 VITALS — BP 134/82 | HR 90 | Ht 62.75 in | Wt 177.0 lb

## 2023-03-16 DIAGNOSIS — Z01812 Encounter for preprocedural laboratory examination: Secondary | ICD-10-CM | POA: Diagnosis not present

## 2023-03-16 DIAGNOSIS — R9389 Abnormal findings on diagnostic imaging of other specified body structures: Secondary | ICD-10-CM | POA: Insufficient documentation

## 2023-03-16 DIAGNOSIS — N926 Irregular menstruation, unspecified: Secondary | ICD-10-CM

## 2023-03-16 LAB — PREGNANCY, URINE: Preg Test, Ur: NEGATIVE

## 2023-03-16 NOTE — Patient Instructions (Signed)
Endometrial Biopsy  An endometrial biopsy is a procedure to remove tissue samples from the endometrium, which is the lining of the uterus. The tissue that is removed can then be checked under a microscope for disease. This procedure is used to diagnose conditions such as endometrial cancer, endometrial tuberculosis, polyps, or other inflammatory conditions. This procedure may also be used to investigate uterine bleeding to determine where you are in your menstrual cycle or how your hormone levels are affecting the lining of the uterus. Tell a health care provider about: Any allergies you have. All medicines you are taking, including vitamins, herbs, eye drops, creams, and over-the-counter medicines. Any problems you or family members have had with anesthetic medicines. Any bleeding problems you have. Any surgeries you have had. Any medical conditions you have. Whether you are pregnant or may be pregnant. What are the risks? Your health care provider will talk with you about risks. These may include: Bleeding. Pelvic infection. Puncture of the wall of the uterus with the biopsy device (rare). Allergic reactions to medicines. What happens before the procedure? Keep a record of your menstrual cycles as told by your health care provider. You may need to schedule your procedure for a specific time in your cycle. Bring a sanitary pad in case you need to wear one after the procedure. Ask your health care provider about: Changing or stopping your regular medicines. These include any diabetes medicines or blood thinners you take. Taking medicines such as aspirin and ibuprofen. These medicines can thin your blood. Do not take these medicines unless your health care provider tells you to. Taking over-the-counter medicines, vitamins, herbs, and supplements. Plan to have someone take you home from the hospital or clinic. What happens during the procedure? You will lie on an exam table with your feet  and legs supported as in a pelvic exam. Your health care provider will insert an instrument into your vagina to see your cervix. Your cervix will be cleansed with an antiseptic solution. A medicine (local anesthetic) will be used to numb the cervix. A forceps instrument will be used to hold your cervix steady for the biopsy. A thin, rod-like instrument (uterine sound) will be inserted through your cervix to determine the length of your uterus and the location where the biopsy sample will be removed. A thin, flexible tube (catheter) will be inserted through your cervix and into the uterus. The catheter will be used to collect the biopsy sample from your endometrial tissue. The tube and instruments will be removed, and the tissue sample will be sent to a lab for examination. The procedure may vary among health care providers and hospitals. What happens after the procedure? Your blood pressure, heart rate, breathing rate, and blood oxygen level will be monitored until you leave the hospital or clinic. It is up to you to get the results of your procedure. Ask your health care provider, or the department that is doing the procedure, when your results will be ready. Summary An endometrial biopsy is a procedure to remove tissue samples from the endometrium, which is the lining of the uterus. This procedure is used to diagnose conditions such as endometrial cancer, endometrial tuberculosis, polyps, or other inflammatory conditions. It is up to you to get the results of your procedure. Ask your health care provider, or the department that is doing the procedure, when your results will be ready. This information is not intended to replace advice given to you by your health care provider. Make sure you   discuss any questions you have with your health care provider. Document Revised: 03/02/2022 Document Reviewed: 03/02/2022 Elsevier Patient Education  2023 [employer]  

## 2023-03-16 NOTE — Addendum Note (Signed)
Addended by: Ardell Isaacs, Debbe Bales E on: 03/16/2023 02:46 PM   Modules accepted: Orders

## 2023-03-17 NOTE — Progress Notes (Deleted)
GYNECOLOGY  VISIT   HPI: 42 y.o.   Single  Caucasian  female   [phone] with No LMP recorded.   here for   7 day f/u  GYNECOLOGIC HISTORY: No LMP recorded. Contraception:  tubal Menopausal hormone therapy:  n/a Last mammogram:  05/13/22 Breast Density Cat B, BI-RADS CAT 2 Benign Last pap smear:   10/29/22 neg: HR HPV neg        OB History     Gravida  3   Para  3   Term  2   Preterm  1   AB      Living  3      SAB      IAB      Ectopic      Multiple      Live Births  3              Patient Active Problem List   Diagnosis Date Noted   Type 2 diabetes mellitus without complication, without long-term current use of insulin 11/05/2022   Dyskinesia 10/01/2022   Chronic bilateral low back pain with bilateral sciatica 10/01/2022   Conversion disorder 04/26/2022   SOB (shortness of breath) 02/26/2022   Anxiety 02/26/2022   History of tobacco abuse 02/26/2022   Moderate episode of recurrent major depressive disorder 12/10/2021   Generalized anxiety disorder 12/10/2021   Chronic constipation 12/10/2021    Past Medical History:  Diagnosis Date   Anxiety    Constipation    Conversion disorder    Depression     Past Surgical History:  Procedure Laterality Date   ARTHROSCOPIC REPAIR ACL     CESAREAN SECTION     3   KNEE SURGERY     TUBAL LIGATION      Current Outpatient Medications  Medication Sig Dispense Refill   blood glucose meter kit and supplies Dispense based on patient and insurance preference. Use up to four times daily as directed. (FOR ICD-10 E10.9, E11.9). 1 each 0   enalapril (VASOTEC) 2.5 MG tablet Take 1 tablet (2.5 mg total) by mouth daily. 90 tablet 3   ipratropium-albuterol (DUONEB) 0.5-2.5 (3) MG/3ML SOLN Take 3 mLs by nebulization every 6 (six) hours as needed (sob/wheezing).     linaclotide (LINZESS) 145 MCG CAPS capsule Take 1 capsule (145 mcg total) by mouth daily before breakfast. 90 capsule 3   LORazepam (ATIVAN) 0.5 MG tablet  Take 0.5 mg by mouth 3 (three) times daily as needed.     metFORMIN (GLUCOPHAGE) 850 MG tablet Take 1 tablet (850 mg total) by mouth 2 (two) times daily with a meal. 60 tablet 2   Peppermint Oil (IBGARD) 90 MG CPCR Take as directed. 16 capsule 0   No current facility-administered medications for this visit.     ALLERGIES: Patient has no known allergies.  Family History  Problem Relation Age of Onset   Diabetes Mother    Depression Mother    Arthritis Mother    Asthma Mother    Learning disabilities Mother    Mental illness Mother    Depression Sister    Asthma Sister    Mental illness Sister    Seizures Daughter    Hypertension Other    Bipolar disorder Other    Anxiety disorder Other    Depression Other     Social History   Socioeconomic History   Marital status: Single    Spouse name: Not on file   Number of children: Not on file  Years of education: Not on file   Highest education level: Not on file  Occupational History   Not on file  Tobacco Use   Smoking status: Former    Packs/day: .25    Types: Cigarettes    Quit date: 08/2021    Years since quitting: 1.5   Smokeless tobacco: Never  Vaping Use   Vaping Use: Former  Substance and Sexual Activity   Alcohol use: No   Drug use: Yes    Types: Marijuana   Sexual activity: Yes    Partners: Male    Birth control/protection: Surgical    Comment: tubal, menarche 42yo, sexual debut 42yo  Other Topics Concern   Not on file  Social History Narrative   Not on file   Social Determinants of Health   Financial Resource Strain: Not on file  Food Insecurity: Not on file  Transportation Needs: Not on file  Physical Activity: Not on file  Stress: Not on file  Social Connections: Not on file  Intimate Partner Violence: Not on file    Review of Systems  PHYSICAL EXAMINATION:    There were no vitals taken for this visit.    General appearance: alert, cooperative and appears stated age Head: Normocephalic,  without obvious abnormality, atraumatic Neck: no adenopathy, supple, symmetrical, trachea midline and thyroid normal to inspection and palpation Lungs: clear to auscultation bilaterally Breasts: normal appearance, no masses or tenderness, No nipple retraction or dimpling, No nipple discharge or bleeding, No axillary or supraclavicular adenopathy Heart: regular rate and rhythm Abdomen: soft, non-tender, no masses,  no organomegaly Extremities: extremities normal, atraumatic, no cyanosis or edema Skin: Skin color, texture, turgor normal. No rashes or lesions Lymph nodes: Cervical, supraclavicular, and axillary nodes normal. No abnormal inguinal nodes palpated Neurologic: Grossly normal  Pelvic: External genitalia:  no lesions              Urethra:  normal appearing urethra with no masses, tenderness or lesions              Bartholins and Skenes: normal                 Vagina: normal appearing vagina with normal color and discharge, no lesions              Cervix: no lesions                Bimanual Exam:  Uterus:  normal size, contour, position, consistency, mobility, non-tender              Adnexa: no mass, fullness, tenderness              Rectal exam: {yes no:[id number]}.  Confirms.              Anus:  normal sphincter tone, no lesions  Chaperone was present for exam:  ***  ASSESSMENT     PLAN     An After Visit Summary was printed and given to the patient.  ______ minutes face to face time of which over 50% was spent in counseling.  '

## 2023-03-18 LAB — SURGICAL PATHOLOGY

## 2023-03-22 ENCOUNTER — Other Ambulatory Visit: Payer: Self-pay | Admitting: *Deleted

## 2023-03-22 MED ORDER — MEDROXYPROGESTERONE ACETATE 10 MG PO TABS: 10.0000 mg | ORAL_TABLET | Freq: Every day | ORAL | 0 refills | Status: DC

## 2023-03-23 ENCOUNTER — Ambulatory Visit: Payer: BC Managed Care – PPO | Admitting: Obstetrics and Gynecology

## 2023-03-30 DIAGNOSIS — Z419 Encounter for procedure for purposes other than remedying health state, unspecified: Secondary | ICD-10-CM | POA: Diagnosis not present

## 2023-04-07 ENCOUNTER — Telehealth: Payer: Self-pay | Admitting: Obstetrics and Gynecology

## 2023-04-07 ENCOUNTER — Emergency Department (HOSPITAL_COMMUNITY)
Admission: EM | Admit: 2023-04-07 | Discharge: 2023-04-07 | Disposition: A | Discharge status: Home / Self Care | Payer: BC Managed Care – PPO | Attending: Student in an Organized Health Care Education/Training Program | Admitting: Student in an Organized Health Care Education/Training Program

## 2023-04-07 ENCOUNTER — Telehealth: Payer: Self-pay

## 2023-04-07 ENCOUNTER — Other Ambulatory Visit (HOSPITAL_COMMUNITY)
Admission: RE | Admit: 2023-04-07 | Discharge: 2023-04-07 | Disposition: A | Discharge status: Home / Self Care | Payer: BC Managed Care – PPO | Source: Ambulatory Visit | Attending: Obstetrics and Gynecology | Admitting: Obstetrics and Gynecology

## 2023-04-07 ENCOUNTER — Other Ambulatory Visit: Payer: Self-pay

## 2023-04-07 ENCOUNTER — Ambulatory Visit (INDEPENDENT_AMBULATORY_CARE_PROVIDER_SITE_OTHER): Payer: BC Managed Care – PPO | Admitting: Obstetrics and Gynecology

## 2023-04-07 VITALS — BP 124/78 | HR 80

## 2023-04-07 DIAGNOSIS — R9389 Abnormal findings on diagnostic imaging of other specified body structures: Secondary | ICD-10-CM

## 2023-04-07 DIAGNOSIS — R102 Pelvic and perineal pain: Secondary | ICD-10-CM

## 2023-04-07 DIAGNOSIS — N939 Abnormal uterine and vaginal bleeding, unspecified: Secondary | ICD-10-CM

## 2023-04-07 DIAGNOSIS — N921 Excessive and frequent menstruation with irregular cycle: Secondary | ICD-10-CM

## 2023-04-07 DIAGNOSIS — R109 Unspecified abdominal pain: Secondary | ICD-10-CM | POA: Diagnosis not present

## 2023-04-07 DIAGNOSIS — Z7984 Long term (current) use of oral hypoglycemic drugs: Secondary | ICD-10-CM | POA: Diagnosis not present

## 2023-04-07 DIAGNOSIS — E119 Type 2 diabetes mellitus without complications: Secondary | ICD-10-CM | POA: Insufficient documentation

## 2023-04-07 DIAGNOSIS — E871 Hypo-osmolality and hyponatremia: Secondary | ICD-10-CM | POA: Insufficient documentation

## 2023-04-07 DIAGNOSIS — N858 Other specified noninflammatory disorders of uterus: Secondary | ICD-10-CM | POA: Diagnosis not present

## 2023-04-07 LAB — CBC
HCT: 30.4 % — ABNORMAL LOW (ref 35.0–45.0)
Hemoglobin: 9.5 g/dL — ABNORMAL LOW (ref 11.7–15.5)
MCH: 24.9 pg — ABNORMAL LOW (ref 27.0–33.0)
MCHC: 31.3 g/dL — ABNORMAL LOW (ref 32.0–36.0)
MCV: 79.8 fL — ABNORMAL LOW (ref 80.0–100.0)
MPV: 10.7 fL (ref 7.5–12.5)
Platelets: 321 10*3/uL (ref 140–400)
RBC: 3.81 10*6/uL (ref 3.80–5.10)
RDW: 13.2 % (ref 11.0–15.0)
WBC: 11.2 10*3/uL — ABNORMAL HIGH (ref 3.8–10.8)

## 2023-04-07 LAB — COMPREHENSIVE METABOLIC PANEL
ALT: 22 U/L (ref 0–44)
AST: 22 U/L (ref 15–41)
Albumin: 3.2 g/dL — ABNORMAL LOW (ref 3.5–5.0)
Alkaline Phosphatase: 59 U/L (ref 38–126)
Anion gap: 8 (ref 5–15)
BUN: 12 mg/dL (ref 6–20)
CO2: 19 mmol/L — ABNORMAL LOW (ref 22–32)
Calcium: 8.4 mg/dL — ABNORMAL LOW (ref 8.9–10.3)
Chloride: 107 mmol/L (ref 98–111)
Creatinine, Ser: 1.06 mg/dL — ABNORMAL HIGH (ref 0.44–1.00)
GFR, Estimated: >60 mL/min (ref >60)
Glucose, Bld: 187 mg/dL — ABNORMAL HIGH (ref 70–99)
Potassium: 4.1 mmol/L (ref 3.5–5.1)
Sodium: 134 mmol/L — ABNORMAL LOW (ref 135–145)
Total Bilirubin: 0.4 mg/dL (ref 0.3–1.2)
Total Protein: 6.7 g/dL (ref 6.5–8.1)

## 2023-04-07 LAB — CBC WITH DIFFERENTIAL/PLATELET
Abs Immature Granulocytes: 0.06 10*3/uL (ref 0.00–0.07)
Basophils Absolute: 0 10*3/uL (ref 0.0–0.1)
Basophils Relative: 0 %
Eosinophils Absolute: 0.1 10*3/uL (ref 0.0–0.5)
Eosinophils Relative: 1 %
HCT: 29.5 % — ABNORMAL LOW (ref 36.0–46.0)
Hemoglobin: 9.3 g/dL — ABNORMAL LOW (ref 12.0–15.0)
Immature Granulocytes: 1 %
Lymphocytes Relative: 14 %
Lymphs Abs: 1.5 10*3/uL (ref 0.7–4.0)
MCH: 25.3 pg — ABNORMAL LOW (ref 26.0–34.0)
MCHC: 31.5 g/dL (ref 30.0–36.0)
MCV: 80.2 fL (ref 80.0–100.0)
Monocytes Absolute: 0.5 10*3/uL (ref 0.1–1.0)
Monocytes Relative: 5 %
Neutro Abs: 8.6 10*3/uL — ABNORMAL HIGH (ref 1.7–7.7)
Neutrophils Relative %: 79 %
Platelets: 312 10*3/uL (ref 150–400)
RBC: 3.68 MIL/uL — ABNORMAL LOW (ref 3.87–5.11)
RDW: 13.4 % (ref 11.5–15.5)
WBC: 10.7 10*3/uL — ABNORMAL HIGH (ref 4.0–10.5)
nRBC: 0 % (ref 0.0–0.2)

## 2023-04-07 LAB — TYPE AND SCREEN
ABO/RH(D): B POS
Antibody Screen: NEGATIVE

## 2023-04-07 LAB — PROTIME-INR
INR: 0.9 (ref 0.8–1.2)
Prothrombin Time: 12.8 seconds (ref 11.4–15.2)

## 2023-04-07 LAB — APTT: aPTT: 24 seconds (ref 24–36)

## 2023-04-07 LAB — ABO/RH: ABO/RH(D): B POS

## 2023-04-07 LAB — PREGNANCY, URINE: Preg Test, Ur: NEGATIVE

## 2023-04-07 MED ORDER — MEGESTROL ACETATE 40 MG PO TABS
80.0000 mg | ORAL_TABLET | Freq: Every day | ORAL | Status: DC
  Administered 2023-04-07: 80 mg via ORAL
  Filled 2023-04-07: qty 2

## 2023-04-07 MED ORDER — MEGESTROL ACETATE 40 MG PO TABS: 40.0000 mg | ORAL_TABLET | Freq: Every day | ORAL | 0 refills | Status: DC

## 2023-04-07 MED ORDER — KETOROLAC TROMETHAMINE 15 MG/ML IJ SOLN: 30.0000 mg | Freq: Once | INTRAMUSCULAR | Status: DC

## 2023-04-07 MED ORDER — MORPHINE SULFATE (PF) 4 MG/ML IV SOLN
4.0000 mg | Freq: Once | INTRAVENOUS | Status: AC
  Administered 2023-04-07: 4 mg via INTRAVENOUS
  Filled 2023-04-07: qty 1

## 2023-04-07 MED ORDER — FENTANYL CITRATE PF 50 MCG/ML IJ SOSY
50.0000 ug | PREFILLED_SYRINGE | Freq: Once | INTRAMUSCULAR | Status: AC
  Administered 2023-04-07: 50 ug via INTRAVENOUS
  Filled 2023-04-07: qty 1

## 2023-04-07 MED ORDER — KETOROLAC TROMETHAMINE 15 MG/ML IJ SOLN
30.0000 mg | Freq: Once | INTRAMUSCULAR | Status: AC
  Administered 2023-04-07: 30 mg via INTRAMUSCULAR

## 2023-04-07 NOTE — Telephone Encounter (Signed)
Pt LVM in triage line stating that she is in tremendous amt of pain. Feels as if she is in labor and has to push something out.   [place] 03/10/23 for irregular bleeding. EMB 03/16/23-benign. 10day provera course provided on 4/23. Sonohyst currently scheduled on 05/05/23.  Spoke w/ pt and she reports that her pain level is most definitely 10/10. Mostly generalized in lower pelvic region but seems to be more in LLQ region. States she completed provera course around 5/2 and started bleeding ~2 days after completing. Stated bleeding became heavier ~3days ago around the time that her cramping started but didn't become this severe until today. Bleeding at its heaviest has been ~changing pads q1hr or slightly longer. Has noticed some clotting ~size of 50 cent piece.  Pt reports some nausea  Pt reports movement makes it worse. Pt reports hot bath helping mildly. Denies relief w/ ibuprofen/tylenol.  Pt notified that provider may recommend ER. Will get confirmation and call her back. Inquired if she had someone to drive her. She states husband is on his way home and will be there in ~10 mins.

## 2023-04-07 NOTE — Telephone Encounter (Signed)
Thank you for the update!

## 2023-04-07 NOTE — ED Triage Notes (Signed)
Pt reports "severe" lower abd pain and vaginal bleeding x 3 days.

## 2023-04-07 NOTE — Telephone Encounter (Signed)
Appt scheduled. Front office notified.   Encounter closed.

## 2023-04-07 NOTE — Addendum Note (Signed)
Addended by: Leda Min on: 04/07/2023 01:26 PM   Modules accepted: Orders

## 2023-04-07 NOTE — Progress Notes (Signed)
GYNECOLOGY  VISIT   HPI: 42 y.o.   Single  Caucasian  female   [phone] here for   heavy vaginal bleeding x's 3 days, severe pelvic pain today. Has nausea and feels a little light headed.    Patient has had ongoing menstrual bleeding since end of January, 2024.   [place] 03/10/23 in office showed thickened endometrium 2.5 cm with vascular and cystic change.  See Epic.  Endometrial biopsy done 03/16/23 showed benign proliferative endometrium, negative for hyperplasia or malignancy.  Patient scheduled for sonohysterogram 05/05/23. She received an Rx for Provera 10 mg x 10 days to try to control bleeding.  Last dose of Provera on 03/31/23.    Now experiencing heavy bleeding and cramping.   GYNECOLOGIC HISTORY: Patient's last menstrual period was 04/04/2023 (approximate). Contraception:  tubal Menopausal hormone therapy:  no Last mammogram:  05/13/22 BI-RADS 2 benign Last pap smear:   10/29/22 NILM, HPV negative        OB History     Gravida  3   Para  3   Term  2   Preterm  1   AB      Living  3      SAB      IAB      Ectopic      Multiple      Live Births  3              Patient Active Problem List   Diagnosis Date Noted   Type 2 diabetes mellitus without complication, without long-term current use of insulin (HCC) 11/05/2022   Dyskinesia 10/01/2022   Chronic bilateral low back pain with bilateral sciatica 10/01/2022   Conversion disorder 04/26/2022   SOB (shortness of breath) 02/26/2022   Anxiety 02/26/2022   History of tobacco abuse 02/26/2022   Moderate episode of recurrent major depressive disorder (HCC) 12/10/2021   Generalized anxiety disorder 12/10/2021   Chronic constipation 12/10/2021    Past Medical History:  Diagnosis Date   Anxiety    Constipation    Conversion disorder    Depression     Past Surgical History:  Procedure Laterality Date   ARTHROSCOPIC REPAIR ACL     CESAREAN SECTION     3   KNEE SURGERY     TUBAL LIGATION       Current Outpatient Medications  Medication Sig Dispense Refill   blood glucose meter kit and supplies Dispense based on patient and insurance preference. Use up to four times daily as directed. (FOR ICD-10 E10.9, E11.9). 1 each 0   enalapril (VASOTEC) 2.5 MG tablet Take 1 tablet (2.5 mg total) by mouth daily. 90 tablet 3   ipratropium-albuterol (DUONEB) 0.5-2.5 (3) MG/3ML SOLN Take 3 mLs by nebulization every 6 (six) hours as needed (sob/wheezing).     linaclotide (LINZESS) 145 MCG CAPS capsule Take 1 capsule (145 mcg total) by mouth daily before breakfast. 90 capsule 3   LORazepam (ATIVAN) 0.5 MG tablet Take 0.5 mg by mouth 3 (three) times daily as needed.     metFORMIN (GLUCOPHAGE) 850 MG tablet Take 1 tablet (850 mg total) by mouth 2 (two) times daily with a meal. 60 tablet 2   Peppermint Oil (IBGARD) 90 MG CPCR Take as directed. 16 capsule 0   medroxyPROGESTERone (PROVERA) 10 MG tablet Take 1 tablet (10 mg total) by mouth daily for 10 days. 10 tablet 0   No current facility-administered medications for this visit.     ALLERGIES: Patient has no  known allergies.  Family History  Problem Relation Age of Onset   Diabetes Mother    Depression Mother    Arthritis Mother    Asthma Mother    Learning disabilities Mother    Mental illness Mother    Depression Sister    Asthma Sister    Mental illness Sister    Seizures Daughter    Hypertension Other    Bipolar disorder Other    Anxiety disorder Other    Depression Other     Social History   Socioeconomic History   Marital status: Single    Spouse name: Not on file   Number of children: Not on file   Years of education: Not on file   Highest education level: Not on file  Occupational History   Not on file  Tobacco Use   Smoking status: Former    Packs/day: .25    Types: Cigarettes    Quit date: 08/2021    Years since quitting: 1.6   Smokeless tobacco: Never  Vaping Use   Vaping Use: Former  Substance and Sexual  Activity   Alcohol use: No   Drug use: Yes    Types: Marijuana   Sexual activity: Yes    Partners: Male    Birth control/protection: Surgical    Comment: tubal, menarche 42yo, sexual debut 42yo  Other Topics Concern   Not on file  Social History Narrative   Not on file   Social Determinants of Health   Financial Resource Strain: Not on file  Food Insecurity: Not on file  Transportation Needs: Not on file  Physical Activity: Not on file  Stress: Not on file  Social Connections: Not on file  Intimate Partner Violence: Not on file    Review of Systems  Genitourinary:  Positive for pelvic pain and vaginal bleeding.  All other systems reviewed and are negative.   PHYSICAL EXAMINATION:    BP 124/78 (BP Location: Right Arm, Patient Position: Sitting, Cuff Size: Large)   Pulse 80   LMP 04/04/2023 (Approximate)     General appearance: alert, cooperative and appears stated age   Pelvic: External genitalia:  no lesions              Urethra:  normal appearing urethra with no masses, tenderness or lesions              Bartholins and Skenes: normal                 Vagina: normal appearing vagina with normal color and discharge, no lesions              Cervix: no lesions.  Clot coming from the os.                  Bimanual Exam:  Uterus:  normal size, contour, position, consistency, mobility, non-tender              Adnexa: no mass, fullness, tenderness   Procedure - Rocket EMB Consent done.  Sterile prep with Hibiclens. Paracervical block 10 cc 1% lidocaine, lot [id number], exp 05/2025.  Rocket passed x 3 to 8 cm.   Tissue to pathology.   Patient received Toradol 30 mg IM x 1.    Bleeding and pain is persistent.   Chaperone was present for exam:  Gwenith Spitz, CMA  ASSESSMENT  Menorrhagia with irregular bleeding.  Endometrial thickening.   PLAN  Rocket EMB done to 8 cm x 3 and to pathology. CBC. Toradol  30 mg IM.  Patient is not improving sufficiently for trial of  Megace.  Patient will present to [facility] ER for continued care.  I anticipate she will need a dilation and curettage procedure.   20  total time was spent for this patient encounter, including preparation, face-to-face counseling with the patient, coordination of care, and documentation of the encounter in addition to doing endometrial rocket biopsy.

## 2023-04-07 NOTE — ED Provider Notes (Signed)
[place] EMERGENCY DEPARTMENT AT [facility] Provider Note   CSN: [id number] Arrival date & time: 04/07/23  1218     History Chief Complaint  Patient presents with   Abdominal Pain    Allison Galloway is a 42 y.o. female with hx of diabetes who presents to the ED with menorrhagia that has been ongoing and constant for 3 days.  Per chart review, patient was seen evaluated at her OB/GYN today and had an endometrial biopsy secondary to excessive vaginal bleeding.  She is also been having constant worsening lower abdominal pain.  Bleeding was unable to lessen or get controlled in the office and she was sent to the emergency department today for further evaluation.  Based on the provider's assessment and plan they were recommending a potential D&C here. She denies any nausea, vomiting, and diarrhea. She denies fever, chills. She states she is bleeding through a pad an hour.   Abdominal Pain      Home Medications Prior to Admission medications   Medication Sig Start Date End Date Taking? Authorizing Provider  busPIRone (BUSPAR) 5 MG tablet Take 5 mg by mouth 3 (three) times daily as needed (for anxiety).   Yes [provider]  enalapril (VASOTEC) 2.5 MG tablet Take 1 tablet (2.5 mg total) by mouth daily. 11/05/22  Yes [name], Doralee Albino, FNP  ibuprofen (ADVIL) 200 MG tablet Take 200-400 mg by mouth every 6 (six) hours as needed for mild pain, headache or cramping.   Yes [provider]  linaclotide Karlene Einstein) 145 MCG CAPS capsule Take 1 capsule (145 mcg total) by mouth daily before breakfast. 03/02/23 02/25/24 Yes Doree Albee, PA-C  metFORMIN (GLUCOPHAGE) 850 MG tablet Take 1 tablet (850 mg total) by mouth 2 (two) times daily with a meal. 01/28/23  Yes Chrzanowski, Jami B, NP  TYLENOL 500 MG tablet Take 500-1,000 mg by mouth every 6 (six) hours as needed for mild pain or headache.   Yes [provider]  blood glucose meter kit and supplies Dispense based on  patient and insurance preference. Use up to four times daily as directed. (FOR ICD-10 E10.9, E11.9). 11/05/22   [name], FNP  medroxyPROGESTERone (PROVERA) 10 MG tablet Take 1 tablet (10 mg total) by mouth daily for 10 days. Patient not taking: Reported on 04/07/2023 03/22/23 04/07/23  Patton Salles, MD  megestrol (MEGACE) 40 MG tablet Take 1 tablet (40 mg total) by mouth daily. This will be a taper. 80 mg BID for 3 days then 40 mg BID for 3 days then 40 daily for 3 days 04/07/23   Teressa Lower, PA-C  Peppermint Oil (IBGARD) 90 MG CPCR Take as directed. Patient not taking: Reported on 04/07/2023 02/11/23   Doree Albee, PA-C      Allergies    Patient has no known allergies.    Review of Systems   Review of Systems  Gastrointestinal:  Positive for abdominal pain.  All other systems reviewed and are negative.   Physical Exam Updated Vital Signs BP 111/72   Pulse 65   Temp 98.1 F (36.7 C) (Oral)   Resp 16   Ht 5\' 2"  (1.575 m)   Wt 80 kg   LMP 04/04/2023 (Approximate)   SpO2 95%   BMI 32.26 kg/m  Physical Exam Vitals and nursing note reviewed.  Constitutional:      General: She is not in acute distress.    Appearance: Normal appearance.  HENT:  Head: Normocephalic and atraumatic.  Eyes:     General:        Right eye: No discharge.        Left eye: No discharge.  Cardiovascular:     Comments: Regular rate and rhythm.  S1/S2 are distinct without any evidence of murmur, rubs, or gallops.  Radial pulses are 2+ bilaterally.  Dorsalis pedis pulses are 2+ bilaterally.  No evidence of pedal edema. Pulmonary:     Comments: Clear to auscultation bilaterally.  Normal effort.  No respiratory distress.  No evidence of wheezes, rales, or rhonchi heard throughout. Abdominal:     General: Abdomen is flat. Bowel sounds are normal. There is no distension.     Tenderness: There is abdominal tenderness. There is no guarding or rebound.     Comments: Tenderness to the  lower abdomen.   Musculoskeletal:        General: Normal range of motion.     Cervical back: Neck supple.  Skin:    General: Skin is warm and dry.     Coloration: Skin is pale.     Findings: No rash.  Neurological:     General: No focal deficit present.     Mental Status: She is alert.  Psychiatric:        Mood and Affect: Mood normal.        Behavior: Behavior normal.     ED Results / Procedures / Treatments   Labs (all labs ordered are listed, but only abnormal results are displayed) Labs Reviewed  CBC WITH DIFFERENTIAL/PLATELET - Abnormal; Notable for the following components:      Result Value   WBC 10.7 (*)    RBC 3.68 (*)    Hemoglobin 9.3 (*)    HCT 29.5 (*)    MCH 25.3 (*)    Neutro Abs 8.6 (*)    All other components within normal limits  COMPREHENSIVE METABOLIC PANEL - Abnormal; Notable for the following components:   Sodium 134 (*)    CO2 19 (*)    Glucose, Bld 187 (*)    Creatinine, Ser 1.06 (*)    Calcium 8.4 (*)    Albumin 3.2 (*)    All other components within normal limits  PROTIME-INR  APTT  PREGNANCY, URINE  TYPE AND SCREEN  ABO/RH    EKG None  Radiology No results found.  Procedures Procedures    Medications Ordered in ED Medications  megestrol (MEGACE) tablet 80 mg (80 mg Oral Given 04/07/23 1620)  fentaNYL (SUBLIMAZE) injection 50 mcg (50 mcg Intravenous Given 04/07/23 1305)  morphine (PF) 4 MG/ML injection 4 mg (4 mg Intravenous Given 04/07/23 1619)    ED Course/ Medical Decision Making/ A&P Clinical Course as of 04/07/23 1713  Thu Apr 07, 2023  1530 I spoke with Dr. Briscoe Deutscher with OBGYN who recommends a repeat pelvic exam and call her back. [CF]  1536 I spoke with Dr. Briscoe Deutscher with OBGYN who recommends 80mg  of Megace now and reassess. If bleeding is controlled we can continue medical management and if not she may need medical admission. Will hold off on D&C for now.  [CF]  1633 On reevaluation, patient's pain is much improved after  morphine. Megace taper and follow-up with OBGYN in the outpatient setting. [CF]  1634 CBC with Differential(!) Patient had a 4 g drop from previous record 5 months ago. [CF]  1636 Protime-INR Normal. [CF]  1636 APTT Normal. [CF]  1636 Pregnancy, urine Negative. [CF]  1636 Comprehensive metabolic  panel(!) Mild hyponatremia slightly elevated creatinine but otherwise normal. [CF]  1636 Type and screen [facility] B+ [CF]    Clinical Course User Index [CF] Teressa Lower, PA-C   {   Click here for ABCD2, HEART and other calculators  Medical Decision Making Macaylee Bireley is a 42 y.o. female patient who presents to the emergency department today for further evaluation of lower abdominal pain and menorrhagia.  This been ongoing for 3 days.  Patient does appear uncomfortable.  Vital signs are stable.  She does appear pale.  I will plan to get some basic labs to evaluate hemoglobin status in addition to some coag studies.  I will also give her some pain medication and plan to reassess once the labs result.  Hemoglobin had a 4 g drop in comparison to previous.  She is hemodynamically stable at this time and we will hold off on blood products.  She tolerated Megace well.  Pain is improved.  I am going to medically manage her in the outpatient setting with the Megace taper after speaking with OB/GYN.  She will follow-up with her OB/GYN in 7 to 10 days for further evaluation.  Strict return precautions were discussed.  She is safe for discharge.  Amount and/or Complexity of Data Reviewed Labs: ordered. Decision-making details documented in ED Course.  Risk Prescription drug management.    Final Clinical Impression(s) / ED Diagnoses Final diagnoses:  Vaginal bleeding  Pelvic pain    Rx / DC Orders ED Discharge Orders          Ordered    megestrol (MEGACE) 40 MG tablet  Daily,   Status:  Discontinued        04/07/23 1711    megestrol (MEGACE) 40 MG tablet  Daily         04/07/23 1712              Teressa Lower, [place] 04/07/23 1713    Lowther, Amy, DO 04/12/23 1736

## 2023-04-07 NOTE — Addendum Note (Signed)
Addended by: Windle Guard on: 04/07/2023 01:36 PM   Modules accepted: Orders

## 2023-04-07 NOTE — Discharge Instructions (Signed)
I have written you a prescription for Megace which will help with the bleeding.  In addition, you can take 600 mg of ibuprofen every 6 hours which should help with the cramping.  I would like for you to follow-up with Dr. Edward Jolly and 7 to 10 days to ensure we are going the right direction.  Please return to the emergency room for any worsening symptoms.

## 2023-04-07 NOTE — Telephone Encounter (Signed)
Per Dr. Edward Jolly: Have pt come in so that we can address not only her pain (have toradol in office) but her bleeding as well.   Pt notified and voiced understanding. States lives in [place], [place]. She gave an estimated travel and arrival time of ~1.5 hours. Will have husband bring her in. Clinical supervisor notified of ETA so that schedule/front desk could be made aware/updated.

## 2023-04-07 NOTE — Progress Notes (Signed)
Opened in error

## 2023-04-07 NOTE — Telephone Encounter (Signed)
Please contact patient to schedule a 7 - 10 day follow up with me in the office.  She was see in her ER on 04/07/23.

## 2023-04-08 NOTE — Telephone Encounter (Signed)
Pt scheduled for 04/13/2023. Will route to provider for final review and close encounter.

## 2023-04-08 NOTE — Telephone Encounter (Signed)
Msg forwarded to appt desk.

## 2023-04-12 LAB — SURGICAL PATHOLOGY

## 2023-04-12 NOTE — Progress Notes (Unsigned)
GYNECOLOGY  VISIT   HPI: 42 y.o.   Single  Caucasian  female   [phone] with Patient's last menstrual period was 04/04/2023 (approximate).   here for   ER follow up. Pt has less pain and bleeding but still in pain.  Sent from the office to the ER 04/07/23  for heavy menstrual bleeding.  She has known thickened endometrium with a benign endometrial biopsy and was treated with a course of Provera x 10 days.  The patient presented on 04/07/23 with heavy bleeding and pain.  She had a rocket endometrial biopsy in the office and was treated with Toradol 30 mg IM.  Her bleeding and pain persisted and she was sent to [facility] ER for further evaluation and treatment.  She was treated with narcotics and started on a Megace taper.  Hgb 9.3.  She is currently taking Megace 40 mg twice daily and tomorrow she will take one daily, by her currently protocol.    Her bleeding has lessened overall but is increasing with the reduction of the Megace dosage. Currently changing a pad twice a day.   Some cramping on her left side.  Taking Tylenol 1000 mg and Ibuprofen 800 mg twice a day.   The pathology report from the [facility] E[facility] showed pseudeciaualization of the endometrium.  No hyperplasia or malignancy.  No endometritis.   Declines future childbearing.   Desires for all bleeding and pain to stop.  GYNECOLOGIC HISTORY: Patient's last menstrual period was 04/04/2023 (approximate). Contraception:  tubal Menopausal hormone therapy:  n/a Last mammogram:  05/13/22 BI-RADS 2 benign  Last pap smear:   10/29/22 NILM, HPV negative         OB History     Gravida  3   Para  3   Term  2   Preterm  1   AB      Living  3      SAB      IAB      Ectopic      Multiple      Live Births  3              Patient Active Problem List   Diagnosis Date Noted   Type 2 diabetes mellitus without complication, without long-term current use of insulin (HCC) 11/05/2022   Dyskinesia 10/01/2022   Chronic  bilateral low back pain with bilateral sciatica 10/01/2022   Conversion disorder 04/26/2022   SOB (shortness of breath) 02/26/2022   Anxiety 02/26/2022   History of tobacco abuse 02/26/2022   Moderate episode of recurrent major depressive disorder (HCC) 12/10/2021   Generalized anxiety disorder 12/10/2021   Chronic constipation 12/10/2021    Past Medical History:  Diagnosis Date   Anxiety    Constipation    Conversion disorder    Depression     Past Surgical History:  Procedure Laterality Date   ARTHROSCOPIC REPAIR ACL     CESAREAN SECTION     3   KNEE SURGERY     TUBAL LIGATION      Current Outpatient Medications  Medication Sig Dispense Refill   blood glucose meter kit and supplies Dispense based on patient and insurance preference. Use up to four times daily as directed. (FOR ICD-10 E10.9, E11.9). 1 each 0   busPIRone (BUSPAR) 5 MG tablet Take 5 mg by mouth 3 (three) times daily as needed (for anxiety).     enalapril (VASOTEC) 2.5 MG tablet Take 1 tablet (2.5 mg total) by mouth daily. 90  tablet 3   ibuprofen (ADVIL) 200 MG tablet Take 200-400 mg by mouth every 6 (six) hours as needed for mild pain, headache or cramping.     linaclotide (LINZESS) 145 MCG CAPS capsule Take 1 capsule (145 mcg total) by mouth daily before breakfast. 90 capsule 3   metFORMIN (GLUCOPHAGE) 850 MG tablet Take 1 tablet (850 mg total) by mouth 2 (two) times daily with a meal. 60 tablet 2   Peppermint Oil (IBGARD) 90 MG CPCR Take as directed. 16 capsule 0   TYLENOL 500 MG tablet Take 500-1,000 mg by mouth every 6 (six) hours as needed for mild pain or headache.     megestrol (MEGACE) 40 MG tablet Take 2 tablets (80 mg) by mouth twice daily. 120 tablet 0   No current facility-administered medications for this visit.     ALLERGIES: Patient has no known allergies.  Family History  Problem Relation Age of Onset   Diabetes Mother    Depression Mother    Arthritis Mother    Asthma Mother     Learning disabilities Mother    Mental illness Mother    Depression Sister    Asthma Sister    Mental illness Sister    Seizures Daughter    Hypertension Other    Bipolar disorder Other    Anxiety disorder Other    Depression Other     Social History   Socioeconomic History   Marital status: Single    Spouse name: Not on file   Number of children: Not on file   Years of education: Not on file   Highest education level: Not on file  Occupational History   Not on file  Tobacco Use   Smoking status: Former    Packs/day: .25    Types: Cigarettes    Quit date: 08/2021    Years since quitting: 1.6   Smokeless tobacco: Never  Vaping Use   Vaping Use: Former  Substance and Sexual Activity   Alcohol use: No   Drug use: Yes    Types: Marijuana   Sexual activity: Yes    Partners: Male    Birth control/protection: Surgical    Comment: tubal, menarche 42yo, sexual debut 42yo  Other Topics Concern   Not on file  Social History Narrative   Not on file   Social Determinants of Health   Financial Resource Strain: Not on file  Food Insecurity: Not on file  Transportation Needs: Not on file  Physical Activity: Not on file  Stress: Not on file  Social Connections: Not on file  Intimate Partner Violence: Not on file    Review of Systems  All other systems reviewed and are negative.   PHYSICAL EXAMINATION:    BP 128/84 (BP Location: Left Arm, Patient Position: Sitting, Cuff Size: Normal)   Pulse (!) 104   Ht 5\' 2"  (1.575 m)   Wt 176 lb (79.8 kg)   LMP 04/04/2023 (Approximate)   SpO2 98%   BMI 32.19 kg/m     General appearance: alert, cooperative and appears stated age   Pelvic: External genitalia:  no lesions              Urethra:  normal appearing urethra with no masses, tenderness or lesions              Bartholins and Skenes: normal                 Vagina: normal appearing vagina with normal color and discharge,  no lesions              Cervix: no lesions.  Clot  from the os noted.                 Bimanual Exam:  Uterus:  normal size, contour, position, consistency, mobility, and is tender              Adnexa: no mass, fullness, tenderness         Chaperone was present for exam:  Warren Lacy, CMA  ASSESSMENT  Menorrhagia with irregular menses.  Pelvic pain.    PLAN  I recommended increasing the Megace back up to 80 mg twice daily.  Will check CBC today.  We briefly discussed Mirena IUD, ablation, and hysterectomy.  Referral to surgical gynecologist, Dr. Briscoe Deutscher, for potential hysterectomy.   27 min  total time was spent for this patient encounter, including preparation, face-to-face counseling with the patient, coordination of care, and documentation of the encounter.

## 2023-04-13 ENCOUNTER — Ambulatory Visit (INDEPENDENT_AMBULATORY_CARE_PROVIDER_SITE_OTHER): Payer: BC Managed Care – PPO | Admitting: Obstetrics and Gynecology

## 2023-04-13 ENCOUNTER — Encounter: Payer: Self-pay | Admitting: Obstetrics and Gynecology

## 2023-04-13 VITALS — BP 128/84 | HR 104 | Ht 62.0 in | Wt 176.0 lb

## 2023-04-13 DIAGNOSIS — N921 Excessive and frequent menstruation with irregular cycle: Secondary | ICD-10-CM

## 2023-04-13 LAB — CBC
HCT: 32.3 % — ABNORMAL LOW (ref 35.0–45.0)
Hemoglobin: 10.2 g/dL — ABNORMAL LOW (ref 11.7–15.5)
MCH: 24.5 pg — ABNORMAL LOW (ref 27.0–33.0)
MCHC: 31.6 g/dL — ABNORMAL LOW (ref 32.0–36.0)
MCV: 77.5 fL — ABNORMAL LOW (ref 80.0–100.0)
MPV: 10.3 fL (ref 7.5–12.5)
Platelets: 415 10*3/uL — ABNORMAL HIGH (ref 140–400)
RBC: 4.17 10*6/uL (ref 3.80–5.10)
RDW: 13.5 % (ref 11.0–15.0)
WBC: 11.3 10*3/uL — ABNORMAL HIGH (ref 3.8–10.8)

## 2023-04-13 MED ORDER — MEGESTROL ACETATE 40 MG PO TABS: ORAL_TABLET | ORAL | 0 refills | Status: DC

## 2023-04-13 NOTE — Patient Instructions (Signed)
Take Slow- Fe by mouth once daily.

## 2023-04-14 ENCOUNTER — Other Ambulatory Visit: Payer: Self-pay | Admitting: Obstetrics and Gynecology

## 2023-04-14 ENCOUNTER — Telehealth: Payer: Self-pay | Admitting: Obstetrics and Gynecology

## 2023-04-14 DIAGNOSIS — N921 Excessive and frequent menstruation with irregular cycle: Secondary | ICD-10-CM

## 2023-04-14 NOTE — Progress Notes (Signed)
Referral to Dr. Briscoe Deutscher for surgical care.

## 2023-04-14 NOTE — Telephone Encounter (Signed)
Referral placed to Dr. Briscoe Deutscher for surgical care for patient who has menorrhagia with irregular menses.   Dr. Briscoe Deutscher was on call when the patient was seen in the ER at [facility].

## 2023-04-14 NOTE — Telephone Encounter (Signed)
Routing to [employer].   Encounter closed

## 2023-04-20 ENCOUNTER — Emergency Department (HOSPITAL_COMMUNITY)
Admission: EM | Admit: 2023-04-20 | Discharge: 2023-04-20 | Disposition: A | Discharge status: Home / Self Care | Payer: BC Managed Care – PPO | Attending: Emergency Medicine | Admitting: Emergency Medicine

## 2023-04-20 ENCOUNTER — Other Ambulatory Visit: Payer: Self-pay

## 2023-04-20 ENCOUNTER — Encounter (HOSPITAL_COMMUNITY): Payer: Self-pay | Admitting: *Deleted

## 2023-04-20 ENCOUNTER — Telehealth: Payer: Self-pay | Admitting: *Deleted

## 2023-04-20 DIAGNOSIS — Z743 Need for continuous supervision: Secondary | ICD-10-CM | POA: Diagnosis not present

## 2023-04-20 DIAGNOSIS — R112 Nausea with vomiting, unspecified: Secondary | ICD-10-CM | POA: Diagnosis not present

## 2023-04-20 DIAGNOSIS — E860 Dehydration: Secondary | ICD-10-CM | POA: Insufficient documentation

## 2023-04-20 DIAGNOSIS — E119 Type 2 diabetes mellitus without complications: Secondary | ICD-10-CM | POA: Insufficient documentation

## 2023-04-20 DIAGNOSIS — Z7984 Long term (current) use of oral hypoglycemic drugs: Secondary | ICD-10-CM | POA: Insufficient documentation

## 2023-04-20 DIAGNOSIS — I959 Hypotension, unspecified: Secondary | ICD-10-CM | POA: Diagnosis not present

## 2023-04-20 DIAGNOSIS — R58 Hemorrhage, not elsewhere classified: Secondary | ICD-10-CM | POA: Diagnosis not present

## 2023-04-20 LAB — COMPREHENSIVE METABOLIC PANEL
ALT: 18 U/L (ref 0–44)
AST: 16 U/L (ref 15–41)
Albumin: 4.3 g/dL (ref 3.5–5.0)
Alkaline Phosphatase: 52 U/L (ref 38–126)
Anion gap: 13 (ref 5–15)
BUN: 15 mg/dL (ref 6–20)
CO2: 15 mmol/L — ABNORMAL LOW (ref 22–32)
Calcium: 9.4 mg/dL (ref 8.9–10.3)
Chloride: 108 mmol/L (ref 98–111)
Creatinine, Ser: 1.24 mg/dL — ABNORMAL HIGH (ref 0.44–1.00)
GFR, Estimated: 56 mL/min — ABNORMAL LOW (ref >60)
Glucose, Bld: 151 mg/dL — ABNORMAL HIGH (ref 70–99)
Potassium: 3.6 mmol/L (ref 3.5–5.1)
Sodium: 136 mmol/L (ref 135–145)
Total Bilirubin: 0.8 mg/dL (ref 0.3–1.2)
Total Protein: 8.2 g/dL — ABNORMAL HIGH (ref 6.5–8.1)

## 2023-04-20 LAB — BASIC METABOLIC PANEL
Anion gap: 6 (ref 5–15)
BUN: 13 mg/dL (ref 6–20)
CO2: 19 mmol/L — ABNORMAL LOW (ref 22–32)
Calcium: 8.3 mg/dL — ABNORMAL LOW (ref 8.9–10.3)
Chloride: 113 mmol/L — ABNORMAL HIGH (ref 98–111)
Creatinine, Ser: 1.09 mg/dL — ABNORMAL HIGH (ref 0.44–1.00)
GFR, Estimated: >60 mL/min (ref >60)
Glucose, Bld: 144 mg/dL — ABNORMAL HIGH (ref 70–99)
Potassium: 4.5 mmol/L (ref 3.5–5.1)
Sodium: 138 mmol/L (ref 135–145)

## 2023-04-20 LAB — URINALYSIS, ROUTINE W REFLEX MICROSCOPIC
Bilirubin Urine: NEGATIVE
Glucose, UA: NEGATIVE mg/dL
Hgb urine dipstick: NEGATIVE
Ketones, ur: 20 mg/dL — AB
Nitrite: NEGATIVE
Protein, ur: 30 mg/dL — AB
Specific Gravity, Urine: 1.024 (ref 1.005–1.030)
pH: 5 (ref 5.0–8.0)

## 2023-04-20 LAB — CBC
HCT: 33.1 % — ABNORMAL LOW (ref 36.0–46.0)
Hemoglobin: 10.4 g/dL — ABNORMAL LOW (ref 12.0–15.0)
MCH: 24.1 pg — ABNORMAL LOW (ref 26.0–34.0)
MCHC: 31.4 g/dL (ref 30.0–36.0)
MCV: 76.6 fL — ABNORMAL LOW (ref 80.0–100.0)
Platelets: 452 10*3/uL — ABNORMAL HIGH (ref 150–400)
RBC: 4.32 MIL/uL (ref 3.87–5.11)
RDW: 14 % (ref 11.5–15.5)
WBC: 13 10*3/uL — ABNORMAL HIGH (ref 4.0–10.5)
nRBC: 0 % (ref 0.0–0.2)

## 2023-04-20 LAB — PREGNANCY, URINE: Preg Test, Ur: NEGATIVE

## 2023-04-20 LAB — LIPASE, BLOOD: Lipase: 63 U/L — ABNORMAL HIGH (ref 11–51)

## 2023-04-20 MED ORDER — SODIUM CHLORIDE 0.9 % IV BOLUS
1000.0000 mL | Freq: Once | INTRAVENOUS | Status: AC
  Administered 2023-04-20: 1000 mL via INTRAVENOUS

## 2023-04-20 MED ORDER — ONDANSETRON HCL 4 MG/2ML IJ SOLN
4.0000 mg | Freq: Once | INTRAMUSCULAR | Status: AC
  Administered 2023-04-20: 4 mg via INTRAVENOUS
  Filled 2023-04-20: qty 2

## 2023-04-20 MED ORDER — ONDANSETRON HCL 4 MG PO TABS: 4.0000 mg | ORAL_TABLET | Freq: Three times a day (TID) | ORAL | 0 refills | Status: DC | PRN

## 2023-04-20 NOTE — Discharge Instructions (Signed)
You have been prescribed some Zofran in case your nausea returns.  I advise not taking any additional megestrol as I suspect this is the reason for your nausea and vomiting.  I recommend calling Dr. Edward Jolly for further guidance if your heavy vaginal bleeding returns.

## 2023-04-20 NOTE — ED Notes (Signed)
Pt was informed about moving to waiting room from VT1 , Pt asked "how long is the wait" I informed the pt that the wait time is unknown at this time and I told this pt that as soon as we have a room available we will take her back. Pt then said " Im just going to go home, I can suffer at home" I advised the pt that I cannot advise what she should do. Pt then stood up and attempted to ambulate to the wheelchair pt started falling to the ground, grabbed the chair and lowered herself to the ground. Pt hit her knees when lowering her self down. Pt did not hit her head. I alerted the triage nurse about what happened. The triage nurse came over and asked if the pt was okay and if anything was hurting at this time pt denies any pain at this time. Pt called her boyfriend to come get her. Pt was transported to Er room 4 and was assisted in the bed.

## 2023-04-20 NOTE — ED Triage Notes (Signed)
Pt with N/V/D and loss of appetite for past 3-4 days and was started on medication Megestrol 40 mg BID for vaginal bleeding.

## 2023-04-20 NOTE — ED Provider Triage Note (Signed)
Emergency Medicine Provider Triage Evaluation Note  Allison Galloway , a 42 y.o. female  was evaluated in triage.  Pt complains of extreme weakness, lightheadedness, nausea and vomiting stating she has been unable to able to keep any p.o. intake down since she started taking Megestrol 5 days ago for heavy menses.  She endorses near syncope prior to arrival and generally feels very weak.  She presents with a stutter which she states can be normal for her and her conversion disorder.  Review of Systems  Positive: Weakness, nausea and vomiting, near syncope Negative: Fevers, chills, abdominal pain,  Physical Exam  BP 121/81 (BP Location: Right Arm)   Pulse 92   Temp 98.6 F (37 C) (Oral)   Resp 20   Ht 5\' 2"  (1.575 m)   Wt 80.3 kg   LMP 04/04/2023 (Approximate)   SpO2 99%   BMI 32.37 kg/m  Gen:   Awake, no distress   Resp:  Normal effort  MSK:   Moves extremities without difficulty  Other:    Medical Decision Making  Medically screening exam initiated at 4:44 PM.  Appropriate orders placed.  Allison Galloway was informed that the remainder of the evaluation will be completed by another provider, this initial triage assessment does not replace that evaluation, and the importance of remaining in the ED until their evaluation is complete.     Burgess Amor, PA-C 04/20/23 1647

## 2023-04-20 NOTE — Telephone Encounter (Signed)
Patient left message on triage line requesting return call. Patient states new medication ordered by Dr. Edward Jolly is causing headaches,  reduced appetite and weakness.   Call returned to patient, no answer, Left message to call [facility] triage at [phone], option 4.   Routing to Dr. Marjorie Smolder.  Awaiting return call from patient.

## 2023-04-21 NOTE — Telephone Encounter (Signed)
Patient went to ER yesterday afternoon.  It appears that she left after having initial assessment and blood drawn.   I would recommend she reduce her Megace to 40 mg twice a day.   She has an appointment with Dr. Briscoe Deutscher on 05/02/23.   Please cancel her sonohysterogram appointment at this office.

## 2023-04-21 NOTE — Progress Notes (Deleted)
GYNECOLOGY  VISIT   HPI: 42 y.o.   Single  Caucasian  female   [phone] with Patient's last menstrual period was 04/04/2023 (approximate).   here for   shgm  GYNECOLOGIC HISTORY: Patient's last menstrual period was 04/04/2023 (approximate). Contraception:  BTL Menopausal hormone therapy:  n/a Last mammogram:  05/13/22 BI-RADS 2 benign  Last pap smear:   10/29/22 NILM, HPV negative         OB History     Gravida  3   Para  3   Term  2   Preterm  1   AB      Living  3      SAB      IAB      Ectopic      Multiple      Live Births  3              Patient Active Problem List   Diagnosis Date Noted   Type 2 diabetes mellitus without complication, without long-term current use of insulin (HCC) 11/05/2022   Dyskinesia 10/01/2022   Chronic bilateral low back pain with bilateral sciatica 10/01/2022   Conversion disorder 04/26/2022   SOB (shortness of breath) 02/26/2022   Anxiety 02/26/2022   History of tobacco abuse 02/26/2022   Moderate episode of recurrent major depressive disorder (HCC) 12/10/2021   Generalized anxiety disorder 12/10/2021   Chronic constipation 12/10/2021    Past Medical History:  Diagnosis Date   Anxiety    Constipation    Conversion disorder    Depression     Past Surgical History:  Procedure Laterality Date   ARTHROSCOPIC REPAIR ACL     CESAREAN SECTION     3   KNEE SURGERY     TUBAL LIGATION      Current Outpatient Medications  Medication Sig Dispense Refill   blood glucose meter kit and supplies Dispense based on patient and insurance preference. Use up to four times daily as directed. (FOR ICD-10 E10.9, E11.9). 1 each 0   busPIRone (BUSPAR) 5 MG tablet Take 5 mg by mouth 3 (three) times daily as needed (for anxiety).     enalapril (VASOTEC) 2.5 MG tablet Take 1 tablet (2.5 mg total) by mouth daily. 90 tablet 3   ibuprofen (ADVIL) 200 MG tablet Take 200-400 mg by mouth every 6 (six) hours as needed for mild pain, headache  or cramping.     linaclotide (LINZESS) 145 MCG CAPS capsule Take 1 capsule (145 mcg total) by mouth daily before breakfast. 90 capsule 3   megestrol (MEGACE) 40 MG tablet Take 2 tablets (80 mg) by mouth twice daily. 120 tablet 0   metFORMIN (GLUCOPHAGE) 850 MG tablet Take 1 tablet (850 mg total) by mouth 2 (two) times daily with a meal. 60 tablet 2   ondansetron (ZOFRAN) 4 MG tablet Take 1 tablet (4 mg total) by mouth every 8 (eight) hours as needed for nausea or vomiting. 12 tablet 0   Peppermint Oil (IBGARD) 90 MG CPCR Take as directed. 16 capsule 0   TYLENOL 500 MG tablet Take 500-1,000 mg by mouth every 6 (six) hours as needed for mild pain or headache.     No current facility-administered medications for this visit.     ALLERGIES: Patient has no known allergies.  Family History  Problem Relation Age of Onset   Diabetes Mother    Depression Mother    Arthritis Mother    Asthma Mother    Learning disabilities Mother  Mental illness Mother    Depression Sister    Asthma Sister    Mental illness Sister    Seizures Daughter    Hypertension Other    Bipolar disorder Other    Anxiety disorder Other    Depression Other     Social History   Socioeconomic History   Marital status: Single    Spouse name: Not on file   Number of children: Not on file   Years of education: Not on file   Highest education level: Not on file  Occupational History   Not on file  Tobacco Use   Smoking status: Former    Packs/day: .25    Types: Cigarettes    Quit date: 08/2021    Years since quitting: 1.6   Smokeless tobacco: Never  Vaping Use   Vaping Use: Former  Substance and Sexual Activity   Alcohol use: No   Drug use: Yes    Types: Marijuana   Sexual activity: Yes    Partners: Male    Birth control/protection: Surgical    Comment: tubal, menarche 42yo, sexual debut 42yo  Other Topics Concern   Not on file  Social History Narrative   Not on file   Social Determinants of Health    Financial Resource Strain: Not on file  Food Insecurity: Not on file  Transportation Needs: Not on file  Physical Activity: Not on file  Stress: Not on file  Social Connections: Not on file  Intimate Partner Violence: Not on file    Review of Systems  PHYSICAL EXAMINATION:    LMP 04/04/2023 (Approximate)     General appearance: alert, cooperative and appears stated age Head: Normocephalic, without obvious abnormality, atraumatic Neck: no adenopathy, supple, symmetrical, trachea midline and thyroid normal to inspection and palpation Lungs: clear to auscultation bilaterally Breasts: normal appearance, no masses or tenderness, No nipple retraction or dimpling, No nipple discharge or bleeding, No axillary or supraclavicular adenopathy Heart: regular rate and rhythm Abdomen: soft, non-tender, no masses,  no organomegaly Extremities: extremities normal, atraumatic, no cyanosis or edema Skin: Skin color, texture, turgor normal. No rashes or lesions Lymph nodes: Cervical, supraclavicular, and axillary nodes normal. No abnormal inguinal nodes palpated Neurologic: Grossly normal  Pelvic: External genitalia:  no lesions              Urethra:  normal appearing urethra with no masses, tenderness or lesions              Bartholins and Skenes: normal                 Vagina: normal appearing vagina with normal color and discharge, no lesions              Cervix: no lesions                Bimanual Exam:  Uterus:  normal size, contour, position, consistency, mobility, non-tender              Adnexa: no mass, fullness, tenderness              Rectal exam: {yes no:[id number]}.  Confirms.              Anus:  normal sphincter tone, no lesions  Chaperone was present for exam:  ***  ASSESSMENT     PLAN     An After Visit Summary was printed and given to the patient.  ______ minutes face to face time of which over 50% was spent in  counseling.

## 2023-04-21 NOTE — Telephone Encounter (Signed)
[facility] w/ pt, she states she was advised by ER to cease taking the megace. Pt reports feeling better today, was actually able to keep a meal down and some fluids. Reports that yesterday, she just could not keep anything down food or drink. Reports bleeding seems to have stopped for now.  Pt advised of your recommendations to continue megace but only 40mg  BID instead of 80mg  and keep appt w/ Dr. Briscoe Deutscher for 6/3 and cancel sono for 6/6. Pt voiced understanding and reports will plan to start back on megace tomorrow. Appt has been cancelled for 6/6. Will route to provider for final review and close.

## 2023-04-22 NOTE — ED Provider Notes (Signed)
[place] EMERGENCY DEPARTMENT AT [facility] Provider Note   CSN: [id number] Arrival date & time: 04/20/23  1607     History  Chief Complaint  Patient presents with   Emesis    Allison Galloway is a 42 y.o. female with a history including type 2 diabetes, chronic constipation, anxiety and depression, history of conversion disorder who was recently treated by her gynecologist with Megace for dysmenorrhea.  She started this medication 4 days ago and states very shortly after her first tablet she developed nausea and vomiting, loss of appetite.  She presents with significant weakness, lightheadedness and dehydration.  She states her urine production has been less than normal over the past 24 hours.  She has had no fevers or chills.  She denies chest pain, shortness of breath, abdominal pain, but does endorse abdominal wall soreness due to the vomiting and persistent dry heaves.  She is found no alleviators for symptoms.  Her dysmenorrhea has resolved.  Patient presents with a stutter which she confirms is her conversion disorder which frequently worsens under times of duress.  The history is provided by the patient.       Home Medications Prior to Admission medications   Medication Sig Start Date End Date Taking? Authorizing Provider  ondansetron (ZOFRAN) 4 MG tablet Take 1 tablet (4 mg total) by mouth every 8 (eight) hours as needed for nausea or vomiting. 04/20/23  Yes Twain Stenseth, Raynelle Fanning, PA-C  blood glucose meter kit and supplies Dispense based on patient and insurance preference. Use up to four times daily as directed. (FOR ICD-10 E10.9, E11.9). 11/05/22   [name], FNP  busPIRone (BUSPAR) 5 MG tablet Take 5 mg by mouth 3 (three) times daily as needed (for anxiety).    [provider]  enalapril (VASOTEC) 2.5 MG tablet Take 1 tablet (2.5 mg total) by mouth daily. 11/05/22   [name], FNP  ibuprofen (ADVIL) 200 MG tablet Take 200-400 mg by mouth every 6 (six) hours as  needed for mild pain, headache or cramping.    [provider]  linaclotide Karlene Einstein) 145 MCG CAPS capsule Take 1 capsule (145 mcg total) by mouth daily before breakfast. 03/02/23 02/25/24  Doree Albee, PA-C  megestrol (MEGACE) 40 MG tablet Take 2 tablets (80 mg) by mouth twice daily. 04/13/23   Patton Salles, MD  metFORMIN (GLUCOPHAGE) 850 MG tablet Take 1 tablet (850 mg total) by mouth 2 (two) times daily with a meal. 01/28/23   Chrzanowski, Jami B, NP  Peppermint Oil (IBGARD) 90 MG CPCR Take as directed. 02/11/23   Quentin Mulling R, PA-C  TYLENOL 500 MG tablet Take 500-1,000 mg by mouth every 6 (six) hours as needed for mild pain or headache.    [provider]      Allergies    Patient has no known allergies.    Review of Systems   Review of Systems  Constitutional:  Positive for appetite change and fatigue. Negative for fever.  HENT:  Negative for congestion and sore throat.   Eyes: Negative.   Respiratory:  Negative for chest tightness and shortness of breath.   Cardiovascular:  Negative for chest pain.  Gastrointestinal:  Positive for nausea and vomiting. Negative for abdominal pain.  Genitourinary: Negative.   Musculoskeletal:  Negative for arthralgias, joint swelling and neck pain.  Skin: Negative.  Negative for rash and wound.  Neurological:  Positive for weakness. Negative for dizziness, light-headedness, numbness and headaches.  Psychiatric/Behavioral:  The patient is nervous/anxious.     Physical Exam Updated Vital Signs BP 108/60 (BP Location: Right Arm)   Pulse 65   Temp 97.8 F (36.6 C) (Oral)   Resp 17   Ht 5\' 2"  (1.575 m)   Wt 80.3 kg   LMP 04/04/2023 (Approximate)   SpO2 97%   BMI 32.37 kg/m  Physical Exam Vitals and nursing note reviewed.  Constitutional:      Appearance: She is well-developed. She is not diaphoretic.  HENT:     Head: Normocephalic and atraumatic.     Mouth/Throat:     Mouth: Mucous membranes are dry.   Eyes:     Conjunctiva/sclera: Conjunctivae normal.  Cardiovascular:     Rate and Rhythm: Normal rate and regular rhythm.     Heart sounds: Normal heart sounds.  Pulmonary:     Effort: Pulmonary effort is normal.     Breath sounds: Normal breath sounds. No wheezing.  Abdominal:     General: Bowel sounds are normal. There is no distension.     Palpations: Abdomen is soft.     Tenderness: There is no abdominal tenderness. There is no guarding.  Musculoskeletal:        General: Normal range of motion.     Cervical back: Normal range of motion.  Skin:    General: Skin is warm and dry.  Neurological:     Mental Status: She is alert and oriented to person, place, and time.  Psychiatric:        Mood and Affect: Mood is anxious.     ED Results / Procedures / Treatments   Labs (all labs ordered are listed, but only abnormal results are displayed) Labs Reviewed  LIPASE, BLOOD - Abnormal; Notable for the following components:      Result Value   Lipase 63 (*)    All other components within normal limits  COMPREHENSIVE METABOLIC PANEL - Abnormal; Notable for the following components:   CO2 15 (*)    Glucose, Bld 151 (*)    Creatinine, Ser 1.24 (*)    Total Protein 8.2 (*)    GFR, Estimated 56 (*)    All other components within normal limits  CBC - Abnormal; Notable for the following components:   WBC 13.0 (*)    Hemoglobin 10.4 (*)    HCT 33.1 (*)    MCV 76.6 (*)    MCH 24.1 (*)    Platelets 452 (*)    All other components within normal limits  URINALYSIS, ROUTINE W REFLEX MICROSCOPIC - Abnormal; Notable for the following components:   APPearance HAZY (*)    Ketones, ur 20 (*)    Protein, ur 30 (*)    Leukocytes,Ua TRACE (*)    Bacteria, UA RARE (*)    All other components within normal limits  BASIC METABOLIC PANEL - Abnormal; Notable for the following components:   Chloride 113 (*)    CO2 19 (*)    Glucose, Bld 144 (*)    Creatinine, Ser 1.09 (*)    Calcium 8.3 (*)     All other components within normal limits  PREGNANCY, URINE    EKG None  Radiology No results found.  Procedures Procedures    Medications Ordered in ED Medications  sodium chloride 0.9 % bolus 1,000 mL (0 mLs Intravenous Stopped 04/20/23 1900)  ondansetron (ZOFRAN) injection 4 mg (4 mg Intravenous Given 04/20/23 1810)  sodium chloride 0.9 % bolus 1,000 mL (0 mLs Intravenous Stopped 04/20/23  2100)    ED Course/ Medical Decision Making/ A&P                             Medical Decision Making Patient presenting with that 4-day history of nausea and vomiting who has been unable to tolerate any p.o. intake, presenting here with significant weakness and dehydration.  She was given 1 L of normal saline along with some Zofran after which her nausea greatly improved.  She was able to tolerate p.o. intake at this point in time.  She was unable to give [place] a urine specimen therefore a second liter of normal saline was given.  After the second liter she was asymptomatic and felt greatly improved with her symptoms.  She was prescribed Zofran, encouraged to continue with oral rehydration at home.  Hold Megace until she speaks with her gynecologist.  Amount and/or Complexity of Data Reviewed Labs: ordered.    Details: Significant labs and including a WBC count of 13.0.  I suspect this is secondary to acute phase reaction to copious vomiting.  She has no abdominal pain and her exam is reassuring, no acute abdominal findings present.  She does have a hemoglobin of 10.4 this is a microcytic anemia and appears actually improved by a gram from 2 weeks ago.  Her initial creatinine was significantly elevated 1.24.  This was repeated after she received IV fluids and was improved at 1.09.  No significant findings on her urinalysis.  Her lipase is elevated at 63, this is of an unclear significance, again she has a benign abdomen on exam.  Her lipase was elevated at a similar level 2 years  ago.  Risk Prescription drug management.           Final Clinical Impression(s) / ED Diagnoses Final diagnoses:  Nausea and vomiting, unspecified vomiting type  Dehydration    Rx / DC Orders ED Discharge Orders          Ordered    ondansetron (ZOFRAN) 4 MG tablet  Every 8 hours PRN        04/20/23 2224              Burgess Amor, PA-C 04/22/23 2207    Derwood Kaplan, MD 04/23/23 1510

## 2023-04-30 DIAGNOSIS — Z419 Encounter for procedure for purposes other than remedying health state, unspecified: Secondary | ICD-10-CM | POA: Diagnosis not present

## 2023-05-02 ENCOUNTER — Ambulatory Visit (INDEPENDENT_AMBULATORY_CARE_PROVIDER_SITE_OTHER): Payer: BC Managed Care – PPO | Admitting: Obstetrics and Gynecology

## 2023-05-02 ENCOUNTER — Encounter: Payer: Self-pay | Admitting: Obstetrics and Gynecology

## 2023-05-02 VITALS — BP 133/84 | HR 90 | Wt 173.0 lb

## 2023-05-02 DIAGNOSIS — N941 Unspecified dyspareunia: Secondary | ICD-10-CM | POA: Diagnosis not present

## 2023-05-02 DIAGNOSIS — K5900 Constipation, unspecified: Secondary | ICD-10-CM

## 2023-05-02 DIAGNOSIS — N939 Abnormal uterine and vaginal bleeding, unspecified: Secondary | ICD-10-CM | POA: Diagnosis not present

## 2023-05-02 DIAGNOSIS — N946 Dysmenorrhea, unspecified: Secondary | ICD-10-CM | POA: Diagnosis not present

## 2023-05-02 MED ORDER — NORETHINDRONE ACETATE 5 MG PO TABS
5.0000 mg | ORAL_TABLET | Freq: Every day | ORAL | 2 refills | Status: DC
Start: 2023-05-02 — End: 2023-05-09

## 2023-05-02 NOTE — Progress Notes (Signed)
Patient she has been bleeding since January.

## 2023-05-02 NOTE — Progress Notes (Signed)
NEW GYNECOLOGY PATIENT Patient name: Allison Galloway MRN [id number]  Date of birth: 02/01/1981 Chief Complaint:   Menstrual Problem and Dysmenorrhea     History:  Allison Galloway is a 42 y.o. [phone] being seen today for AUB. Last normal cycle in November but reports bleeding since January. Has had EMB that was benign and on megace for the bleeding. .   Continuous bleeding over the last few months. Would go up to 2 weeks without bleeding in March and then when started on megestrol the bleeding slowed down but with taking 80mg  had significant nausea/vomiting. Told to stop taking the medication after last ED visit an dstartin ghaving dark discharge and now .  No heavy bleeding since last visit and having cramping. Doesn't want to take any more pills than she has to. Offered IUD - declined due to not wanting anything inside her Has some pain b/n periods   Other female family members have had hysterectomies due to bleeding issues Also notes having pain with intercourse Has chronic constipation - was on linzess but stopped due to diarrhea on megace No issues w/ pain medications Dysmenorrhea prior to onset of prolonged bleeding      Gynecologic History Patient's last menstrual period was 04/04/2023 (approximate). Contraception: tubal ligation Last Pap:     Component Value Date/Time   DIAGPAP  10/29/2022 1153    - Negative for intraepithelial lesion or malignancy (NILM)   HPVHIGH Negative 10/29/2022 1153   ADEQPAP  10/29/2022 1153    Satisfactory for evaluation; transformation zone component PRESENT.     Last Mammogram: 04/2022 BIRADS 2 Last Colonoscopy: N/A  Obstetric History OB History  Gravida Para Term Preterm AB Living  3 3 2 1   3   SAB IAB Ectopic Multiple Live Births          3    # Outcome Date GA Lbr Len/2nd Weight Sex Delivery Anes PTL Lv  3 Term 2006 [redacted]w[redacted]d   F CS-LTranv Spinal N LIV  2 Term 2003 [redacted]w[redacted]d   F Vag-Spont Spinal N LIV  1 Preterm 2000 [redacted]w[redacted]d   F CS-LTranv EPI N LIV     Past Medical History:  Diagnosis Date   Anxiety    Constipation    Conversion disorder    Depression     Past Surgical History:  Procedure Laterality Date   ARTHROSCOPIC REPAIR ACL     CESAREAN SECTION     3   KNEE SURGERY     TUBAL LIGATION      Current Outpatient Medications on File Prior to Visit  Medication Sig Dispense Refill   blood glucose meter kit and supplies Dispense based on patient and insurance preference. Use up to four times daily as directed. (FOR ICD-10 E10.9, E11.9). 1 each 0   enalapril (VASOTEC) 2.5 MG tablet Take 1 tablet (2.5 mg total) by mouth daily. 90 tablet 3   linaclotide (LINZESS) 145 MCG CAPS capsule Take 1 capsule (145 mcg total) by mouth daily before breakfast. 90 capsule 3   metFORMIN (GLUCOPHAGE) 850 MG tablet Take 1 tablet (850 mg total) by mouth 2 (two) times daily with a meal. 60 tablet 2   busPIRone (BUSPAR) 5 MG tablet Take 5 mg by mouth 3 (three) times daily as needed (for anxiety). (Patient not taking: Reported on 05/02/2023)     ibuprofen (ADVIL) 200 MG tablet Take 200-400 mg by mouth every 6 (six) hours as needed for mild pain, headache or cramping. (Patient not taking: Reported on 05/02/2023)  megestrol (MEGACE) 40 MG tablet Take 2 tablets (80 mg) by mouth twice daily. (Patient not taking: Reported on 05/02/2023) 120 tablet 0   ondansetron (ZOFRAN) 4 MG tablet Take 1 tablet (4 mg total) by mouth every 8 (eight) hours as needed for nausea or vomiting. (Patient not taking: Reported on 05/02/2023) 12 tablet 0   Peppermint Oil (IBGARD) 90 MG CPCR Take as directed. (Patient not taking: Reported on 05/02/2023) 16 capsule 0   TYLENOL 500 MG tablet Take 500-1,000 mg by mouth every 6 (six) hours as needed for mild pain or headache. (Patient not taking: Reported on 05/02/2023)     No current facility-administered medications on file prior to visit.    No Known Allergies  Social History:  reports that she quit smoking about 20 months ago. Her smoking  use included cigarettes. She smoked an average of .25 packs per day. She has never used smokeless tobacco. She reports current drug use. Drug: Marijuana. She reports that she does not drink alcohol.  Family History  Problem Relation Age of Onset   Diabetes Mother    Depression Mother    Arthritis Mother    Asthma Mother    Learning disabilities Mother    Mental illness Mother    Depression Sister    Asthma Sister    Mental illness Sister    Seizures Daughter    Hypertension Other    Bipolar disorder Other    Anxiety disorder Other    Depression Other     The following portions of the patient's history were reviewed and updated as appropriate: allergies, current medications, past family history, past medical history, past social history, past surgical history and problem list.  Review of Systems Pertinent items noted in HPI and remainder of comprehensive ROS otherwise negative.  Physical Exam:  BP 133/84   Pulse 90   Wt 173 lb (78.5 kg)   LMP 04/04/2023 (Approximate)   BMI 31.64 kg/m  Physical Exam Vitals and nursing note reviewed. Exam conducted with a chaperone present.  Constitutional:      Appearance: Normal appearance.  Cardiovascular:     Rate and Rhythm: Normal rate.  Pulmonary:     Effort: Pulmonary effort is normal.     Breath sounds: Normal breath sounds.  Abdominal:     Comments: + carnette  Genitourinary:    General: Normal vulva.     Exam position: Lithotomy position.     Comments: Normal appearing vulva Posterior vaginal wall nontender, firm stool palpated Right levator ani 1/10 Right ischiococcygeous 6/10 Right obturator internus 7/10 Left levator ani 1/10 Left ischioccocygeous 6/10 Left obturator internus 6/10 Anterior vaginal wall nontender Uterine tenderness nontender Somewhat mobile, small uterus, 2 fingerbreadths on either side of uterus    Neurological:     General: No focal deficit present.     Mental Status: She is alert and oriented  to person, place, and time.  Psychiatric:        Mood and Affect: Mood normal.        Behavior: Behavior normal.        Thought Content: Thought content normal.        Judgment: Judgment normal.       Assessment and Plan:   1. Abnormal uterine bleeding (AUB) - norethindrone (AYGESTIN) 5 MG tablet; Take 1 tablet (5 mg total) by mouth daily.  Dispense: 30 tablet; Refill: 2 2. Dysmenorrhea Discussed switching from megace to aygestin to see if better tolerated to control menses. Up to  date pap and benign EMB on file. Longstanding hx of dysmenorrhea.  Discussed with her management options for AUB including medication, IUD, endometrial ablation, and hysterectomy.  Reviewed that hysterectomy is definitive management of AUB and dysmenorrhea, but that there may be a initial flare of pelvic floor pain secondary to inflammatory reaction of having a hysterectomy.  Noted that hysterectomy would not fix dyspareunia.   3. Dyspareunia in female Exam notable for abdominal pelvic myalgia, recommend pelvic floor physical therapy.  Patient notes that previously recommended PFPT.  Noted that this will likely help with postoperative recovery as well, as well as address dyspareunia. - Ambulatory referral to Physical Therapy  4. Constipation, unspecified constipation type Recommend restarting Linzess for improved bowel function. - Ambulatory referral to Physical Therapy  Patient desires surgical management with RA-TLH, BS, cysto.  The risks of surgery were discussed in detail with the patient including but not limited to: bleeding which may require transfusion or reoperation; infection which may require prolonged hospitalization or re-hospitalization and antibiotic therapy; injury to bowel, bladder, ureters and major vessels or other surrounding organs which may lead to other procedures; formation of adhesions; need for additional procedures including laparotomy or subsequent procedures secondary to  intraoperative injury or abnormal pathology; thromboembolic phenomenon; incisional problems and other postoperative or anesthesia complications.  Patient was told that the likelihood that her condition and symptoms will be treated effectively with this surgical management was high; the postoperative expectations were also discussed in detail. The patient also understands the alternative treatment options which were discussed in full. All questions were answered.  She was told that she will be contacted by our surgical scheduler regarding the time and date of her surgery; routine preoperative instructions will be given to her by the preoperative nursing team.     Routine preventative health maintenance measures emphasized. Please refer to After Visit Summary for other counseling recommendations.   Follow-up: Return for GYN Follow Up. Preop once surgery scheduled       Lorriane Shire, MD Obstetrician & Gynecologist, Faculty [facility] for [facility], [facility]

## 2023-05-05 ENCOUNTER — Other Ambulatory Visit: Payer: BC Managed Care – PPO | Admitting: Obstetrics and Gynecology

## 2023-05-05 ENCOUNTER — Other Ambulatory Visit: Payer: BC Managed Care – PPO

## 2023-05-06 ENCOUNTER — Emergency Department (HOSPITAL_COMMUNITY): Payer: BC Managed Care – PPO

## 2023-05-06 ENCOUNTER — Emergency Department (HOSPITAL_COMMUNITY)
Admission: EM | Admit: 2023-05-06 | Discharge: 2023-05-06 | Disposition: A | Discharge status: Home / Self Care | Payer: BC Managed Care – PPO | Attending: Emergency Medicine | Admitting: Emergency Medicine

## 2023-05-06 DIAGNOSIS — T192XXA Foreign body in vulva and vagina, initial encounter: Secondary | ICD-10-CM | POA: Diagnosis not present

## 2023-05-06 DIAGNOSIS — R102 Pelvic and perineal pain: Secondary | ICD-10-CM | POA: Diagnosis not present

## 2023-05-06 DIAGNOSIS — N939 Abnormal uterine and vaginal bleeding, unspecified: Secondary | ICD-10-CM | POA: Diagnosis not present

## 2023-05-06 DIAGNOSIS — X58XXXA Exposure to other specified factors, initial encounter: Secondary | ICD-10-CM | POA: Insufficient documentation

## 2023-05-06 DIAGNOSIS — R1032 Left lower quadrant pain: Secondary | ICD-10-CM | POA: Diagnosis not present

## 2023-05-06 DIAGNOSIS — N3289 Other specified disorders of bladder: Secondary | ICD-10-CM | POA: Diagnosis not present

## 2023-05-06 LAB — CBC WITH DIFFERENTIAL/PLATELET
Abs Immature Granulocytes: 0.04 10*3/uL (ref 0.00–0.07)
Basophils Absolute: 0 10*3/uL (ref 0.0–0.1)
Basophils Relative: 0 %
Eosinophils Absolute: 0.3 10*3/uL (ref 0.0–0.5)
Eosinophils Relative: 2 %
HCT: 33.7 % — ABNORMAL LOW (ref 36.0–46.0)
Hemoglobin: 10.4 g/dL — ABNORMAL LOW (ref 12.0–15.0)
Immature Granulocytes: 0 %
Lymphocytes Relative: 29 %
Lymphs Abs: 3.9 10*3/uL (ref 0.7–4.0)
MCH: 23.2 pg — ABNORMAL LOW (ref 26.0–34.0)
MCHC: 30.9 g/dL (ref 30.0–36.0)
MCV: 75.1 fL — ABNORMAL LOW (ref 80.0–100.0)
Monocytes Absolute: 0.9 10*3/uL (ref 0.1–1.0)
Monocytes Relative: 7 %
Neutro Abs: 8.5 10*3/uL — ABNORMAL HIGH (ref 1.7–7.7)
Neutrophils Relative %: 62 %
Platelets: 394 10*3/uL (ref 150–400)
RBC: 4.49 MIL/uL (ref 3.87–5.11)
RDW: 14.3 % (ref 11.5–15.5)
WBC: 13.7 10*3/uL — ABNORMAL HIGH (ref 4.0–10.5)
nRBC: 0 % (ref 0.0–0.2)

## 2023-05-06 LAB — COMPREHENSIVE METABOLIC PANEL
ALT: 19 U/L (ref 0–44)
AST: 19 U/L (ref 15–41)
Albumin: 4.1 g/dL (ref 3.5–5.0)
Alkaline Phosphatase: 56 U/L (ref 38–126)
Anion gap: 9 (ref 5–15)
BUN: 12 mg/dL (ref 6–20)
CO2: 21 mmol/L — ABNORMAL LOW (ref 22–32)
Calcium: 9.7 mg/dL (ref 8.9–10.3)
Chloride: 109 mmol/L (ref 98–111)
Creatinine, Ser: 1.04 mg/dL — ABNORMAL HIGH (ref 0.44–1.00)
GFR, Estimated: >60 mL/min (ref >60)
Glucose, Bld: 116 mg/dL — ABNORMAL HIGH (ref 70–99)
Potassium: 3.7 mmol/L (ref 3.5–5.1)
Sodium: 139 mmol/L (ref 135–145)
Total Bilirubin: 0.3 mg/dL (ref 0.3–1.2)
Total Protein: 8.3 g/dL — ABNORMAL HIGH (ref 6.5–8.1)

## 2023-05-06 LAB — URINALYSIS, ROUTINE W REFLEX MICROSCOPIC
Bilirubin Urine: NEGATIVE
Glucose, UA: NEGATIVE mg/dL
Ketones, ur: NEGATIVE mg/dL
Nitrite: POSITIVE — AB
Protein, ur: 30 mg/dL — AB
RBC / HPF: >50 RBC/hpf (ref 0–5)
Specific Gravity, Urine: 1.019 (ref 1.005–1.030)
pH: 5 (ref 5.0–8.0)

## 2023-05-06 LAB — I-STAT CHEM 8, ED
BUN: 10 mg/dL (ref 6–20)
Calcium, Ion: 1.26 mmol/L (ref 1.15–1.40)
Chloride: 109 mmol/L (ref 98–111)
Creatinine, Ser: 1 mg/dL (ref 0.44–1.00)
Glucose, Bld: 111 mg/dL — ABNORMAL HIGH (ref 70–99)
HCT: 33 % — ABNORMAL LOW (ref 36.0–46.0)
Hemoglobin: 11.2 g/dL — ABNORMAL LOW (ref 12.0–15.0)
Potassium: 3.9 mmol/L (ref 3.5–5.1)
Sodium: 142 mmol/L (ref 135–145)
TCO2: 22 mmol/L (ref 22–32)

## 2023-05-06 LAB — I-STAT BETA HCG BLOOD, ED (MC, WL, AP ONLY): I-stat hCG, quantitative: <5 m[IU]/mL (ref <5)

## 2023-05-06 LAB — LIPASE, BLOOD: Lipase: 77 U/L — ABNORMAL HIGH (ref 11–51)

## 2023-05-06 MED ORDER — ACETAMINOPHEN 500 MG PO TABS
1000.0000 mg | ORAL_TABLET | Freq: Once | ORAL | Status: DC
  Filled 2023-05-06: qty 2

## 2023-05-06 MED ORDER — ONDANSETRON HCL 4 MG/2ML IJ SOLN
4.0000 mg | Freq: Once | INTRAMUSCULAR | Status: AC
  Administered 2023-05-06: 4 mg via INTRAVENOUS
  Filled 2023-05-06: qty 2

## 2023-05-06 MED ORDER — KETOROLAC TROMETHAMINE 15 MG/ML IJ SOLN
15.0000 mg | Freq: Once | INTRAMUSCULAR | Status: AC
  Administered 2023-05-06: 15 mg via INTRAVENOUS
  Filled 2023-05-06: qty 1

## 2023-05-06 MED ORDER — HYDROMORPHONE HCL 1 MG/ML IJ SOLN
1.0000 mg | Freq: Once | INTRAMUSCULAR | Status: AC
  Administered 2023-05-06: 1 mg via INTRAVENOUS
  Filled 2023-05-06: qty 1

## 2023-05-06 MED ORDER — IOHEXOL 300 MG/ML  SOLN
100.0000 mL | Freq: Once | INTRAMUSCULAR | Status: AC | PRN
  Administered 2023-05-06: 100 mL via INTRAVENOUS

## 2023-05-06 MED ORDER — SODIUM CHLORIDE 0.9 % IV BOLUS
1000.0000 mL | Freq: Once | INTRAVENOUS | Status: AC
  Administered 2023-05-06: 1000 mL via INTRAVENOUS

## 2023-05-06 NOTE — ED Triage Notes (Signed)
Pt arrived POV. C/o severe pelvic pain that began yesterday. Pt has hx abnormal vaginal bleeding and began taking Norethindrone 4x days ago.

## 2023-05-06 NOTE — Discharge Instructions (Addendum)
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  This is likely contributing to your bleeding in someway.  We have sent the specimen off to pathology for testing and the results will be sent to your gynecologist.  Recommend you follow-up with your gynecologist.  You are at risk for active vaginal bleeding.  If you start to soak more than 1 pad per hour or become short of breath, lightheaded or have chest pain or extremely fatigued or any other concerning symptom please return emergency department for further evaluation.  Otherwise please follow-up with your gynecologist soon as possible.

## 2023-05-06 NOTE — ED Provider Notes (Signed)
EMERGENCY DEPARTMENT AT [facility] Provider Note   CSN: [id number] Arrival date & time: 05/06/23  1327     History  Chief Complaint  Patient presents with   Pelvic Pain   HPI Allison Galloway is a 42 y.o. female with recent history of left-sided ovarian cyst, recurrent vaginal bleeding and tubal ligation presenting for pelvic pain.  Pain is in the left lower quadrant.  Started yesterday.  It was gradual but then has become increasingly worse overnight.  The pain is now "excruciating and feels like a stabbing pain.  At times radiates to her groin.  States she is also been seen by her gynecologist recently for abnormal vaginal bleeding for the last couple weeks.  Started on norethindrone 4 days ago.  States she soaked 2 pads today.  Blood was bright red.  Denies urinary changes.  Denies nausea vomiting diarrhea.  Denies shortness of breath, fatigue and chest pain.   Pelvic Pain       Home Medications Prior to Admission medications   Medication Sig Start Date End Date Taking? Authorizing Provider  blood glucose meter kit and supplies Dispense based on patient and insurance preference. Use up to four times daily as directed. (FOR ICD-10 E10.9, E11.9). 11/05/22   [name], FNP  busPIRone (BUSPAR) 5 MG tablet Take 5 mg by mouth 3 (three) times daily as needed (for anxiety). Patient not taking: Reported on 05/02/2023    [provider]  enalapril (VASOTEC) 2.5 MG tablet Take 1 tablet (2.5 mg total) by mouth daily. 11/05/22   [name], FNP  ibuprofen (ADVIL) 200 MG tablet Take 200-400 mg by mouth every 6 (six) hours as needed for mild pain, headache or cramping. Patient not taking: Reported on 05/02/2023    [provider]  linaclotide [facility]) 145 MCG CAPS capsule Take 1 capsule (145 mcg total) by mouth daily before breakfast. 03/02/23 02/25/24  Doree Albee, PA-C  metFORMIN (GLUCOPHAGE) 850 MG tablet Take 1 tablet (850 mg total) by mouth 2 (two)  times daily with a meal. 01/28/23   Chrzanowski, Jami B, NP  norethindrone (AYGESTIN) 5 MG tablet Take 1 tablet (5 mg total) by mouth daily. 05/02/23   Lorriane Shire, MD  ondansetron (ZOFRAN) 4 MG tablet Take 1 tablet (4 mg total) by mouth every 8 (eight) hours as needed for nausea or vomiting. Patient not taking: Reported on 05/02/2023 04/20/23   Burgess Amor, PA-C  Peppermint Oil (IBGARD) 90 MG CPCR Take as directed. Patient not taking: Reported on 05/02/2023 02/11/23   Doree Albee, PA-C  TYLENOL 500 MG tablet Take 500-1,000 mg by mouth every 6 (six) hours as needed for mild pain or headache. Patient not taking: Reported on 05/02/2023    [provider]      Allergies    Patient has no known allergies.    Review of Systems   Review of Systems  Genitourinary:  Positive for pelvic pain.    Physical Exam   Vitals:   05/06/23 1629 05/06/23 1748  BP:  121/72  Pulse:  76  Resp:  19  Temp: 98.2 F (36.8 C)   SpO2:  99%    CONSTITUTIONAL:  well-appearing, anxious NEURO:  Alert and oriented x 3, CN 3-12 grossly intact EYES:  eyes equal and reactive ENT/NECK:  Supple, no stridor  CARDIO:  tachycardic and regular rhythm, appears well-perfused  PULM:  No respiratory distress, CTAB GI/GU:  non-distended, soft, LLQ tenderness  Pelvic: Noted  bright red blood about the vaginal introitus, bright red blood in the vaginal vault, foreign body noted about the posterior aspect of the vaginal vault.  Cannot visualize cervix.  Foreign body removed and pictured below.    MSK/SPINE:  No gross deformities, no edema, moves all extremities  SKIN:  no rash, atraumatic   *Additional and/or pertinent findings included in MDM below    ED Results / Procedures / Treatments   Labs (all labs ordered are listed, but only abnormal results are displayed) Labs Reviewed  URINALYSIS, ROUTINE W REFLEX MICROSCOPIC - Abnormal; Notable for the following components:      Result Value   APPearance  CLOUDY (*)    Hgb urine dipstick LARGE (*)    Protein, ur 30 (*)    Nitrite POSITIVE (*)    Leukocytes,Ua TRACE (*)    Bacteria, UA FEW (*)    All other components within normal limits  CBC WITH DIFFERENTIAL/PLATELET - Abnormal; Notable for the following components:   WBC 13.7 (*)    Hemoglobin 10.4 (*)    HCT 33.7 (*)    MCV 75.1 (*)    MCH 23.2 (*)    Neutro Abs 8.5 (*)    All other components within normal limits  COMPREHENSIVE METABOLIC PANEL - Abnormal; Notable for the following components:   CO2 21 (*)    Glucose, Bld 116 (*)    Creatinine, Ser 1.04 (*)    Total Protein 8.3 (*)    All other components within normal limits  LIPASE, BLOOD - Abnormal; Notable for the following components:   Lipase 77 (*)    All other components within normal limits  I-STAT CHEM 8, ED - Abnormal; Notable for the following components:   Glucose, Bld 111 (*)    Hemoglobin 11.2 (*)    HCT 33.0 (*)    All other components within normal limits  WET PREP, GENITAL  I-STAT BETA HCG BLOOD, ED ([facility], [facility], [facility] ONLY)  SURGICAL PATHOLOGY    EKG None  Radiology CT ABDOMEN PELVIS W CONTRAST  Result Date: 05/06/2023 CLINICAL DATA:  Pelvic pain starting yesterday.  Vaginal bleeding. EXAM: CT ABDOMEN AND PELVIS WITH CONTRAST TECHNIQUE: Multidetector CT imaging of the abdomen and pelvis was performed using the standard protocol following bolus administration of intravenous contrast. RADIATION DOSE REDUCTION: This exam was performed according to the departmental dose-optimization program which includes automated exposure control, adjustment of the mA and/or kV according to patient size and/or use of iterative reconstruction technique. CONTRAST:  OMNIPAQUE IOHEXOL 300 MG/ML  SOLN COMPARISON:  Pelvic ultrasound 05/06/2023 FINDINGS: Lower chest: Mild dependent atelectasis in both lower lobes. Hepatobiliary: The top of the dome of the liver was excluded. Otherwise the liver and gallbladder appear normal. 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 No further imaging workup of this lesion is indicated. The kidneys appear unremarkable. Wall thickening in the urinary bladder may be from nondistention or cystitis, correlate with urine analysis. Stomach/Bowel: Small periampullary duodenal diverticulum. Normal appendix. No dilated bowel. Vascular/Lymphatic: Unremarkable Reproductive: Prominent hypodensity along the endometrium and cervix, internal density of about 41 Hounsfield units, and mostly homogeneous. This could represent blood products. Along the cervical canal this measures up to 3.0 cm in thickness and along the uterine body this measures up to 1.6 cm in thickness. This also extends into the vagina which is mildly distended. Notably, there is some substantial thinning of the lower  uterine segment on image 99 series 10 with some of this presumed endometrial contents contained along the very thinned uterus, probably representing a cesarean section scar. The adnexa appear unremarkable. Other: No supplemental non-categorized findings. Musculoskeletal: Mild sclerosis along the sacroiliac joints and pubis, appearance suggests mild bilateral symmetric sacroiliitis potentially with mild osteitis pubis. IMPRESSION: 1. Prominent hypodensity along the endometrium, cervix, and extending into the vagina, internal density of about 41 Hounsfield units, and mostly homogeneous. The most likely cause is blood products. 2. Substantial thinning of the lower uterine segment with some of this presumed endometrial contents contained along the very thinned uterus, probably representing a cesarean section scar. No definite findings of rupture along the scar; the adjacent bladder wall is immediately abutting the anterior uterus. Urinary 3. Wall thickening in the urinary bladder may be from nondistention or cystitis, correlate with urine analysis. 4.  Mild sclerosis along the sacroiliac joints and pubis, appearance suggests mild bilateral symmetric sacroiliitis potentially with mild osteitis pubis. 5. 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 No further imaging workup of this lesion is indicated. Electronically Signed   By: Gaylyn Rong M.D.   On: 05/06/2023 18:18   US Pelvis Complete  Result Date: 05/06/2023 CLINICAL DATA:  Pelvic pain. EXAM: TRANSABDOMINAL AND TRANSVAGINAL ULTRASOUND OF PELVIS DOPPLER ULTRASOUND OF OVARIES TECHNIQUE: Both transabdominal and transvaginal ultrasound examinations of the pelvis were performed. Transabdominal technique was performed for global imaging of the pelvis including uterus, ovaries, adnexal regions, and pelvic cul-de-sac. It was necessary to proceed with endovaginal exam following the transabdominal exam to visualize the ovaries. Color and duplex Doppler ultrasound was utilized to evaluate blood flow to the ovaries. COMPARISON:  None Available. FINDINGS: Uterus Measurements: 11 x 6.2 x 6.3 cm = volume: 226 mL. No fibroids or other mass visualized. Endometrium Thickness: 18 mm.  No focal abnormality visualized. Right ovary Measurements: 2.1 x 1.5 x 1.5 cm = volume: 1.9 mL. Normal appearance/no adnexal mass. Left ovary Measurements: 2.3 x 1.2 x 2.0 cm = volume: 4.4 mL. Normal appearance/no adnexal mass. Pulsed Doppler evaluation of both ovaries demonstrates normal low-resistance arterial and venous waveforms. Other findings No abnormal free fluid. IMPRESSION: Thickened endometrium which can be normal depending on timing of the patient's menstrual cycle. Correlation suggested clinically. Electronically Signed   By: Irish Lack M.D.   On: 05/06/2023 15:53   US Transvaginal Non-OB  Result Date: 05/06/2023 CLINICAL DATA:  Pelvic pain. EXAM: TRANSABDOMINAL AND TRANSVAGINAL ULTRASOUND OF PELVIS DOPPLER ULTRASOUND OF OVARIES TECHNIQUE: Both transabdominal and transvaginal ultrasound examinations of the  pelvis were performed. Transabdominal technique was performed for global imaging of the pelvis including uterus, ovaries, adnexal regions, and pelvic cul-de-sac. It was necessary to proceed with endovaginal exam following the transabdominal exam to visualize the ovaries. Color and duplex Doppler ultrasound was utilized to evaluate blood flow to the ovaries. COMPARISON:  None Available. FINDINGS: Uterus Measurements: 11 x 6.2 x 6.3 cm = volume: 226 mL. No fibroids or other mass visualized. Endometrium Thickness: 18 mm.  No focal abnormality visualized. Right ovary Measurements: 2.1 x 1.5 x 1.5 cm = volume: 1.9 mL. Normal appearance/no adnexal mass. Left ovary Measurements: 2.3 x 1.2 x 2.0 cm = volume: 4.4 mL. Normal appearance/no adnexal mass. Pulsed Doppler evaluation of both ovaries demonstrates normal low-resistance arterial and venous waveforms. Other findings No abnormal free fluid. IMPRESSION: Thickened endometrium which can be normal depending on timing of the patient's menstrual cycle. Correlation suggested clinically. Electronically Signed   By: Irish Lack  M.D.   On: 05/06/2023 15:53   [place] Art/Ven Flow Abd Pelv Doppler  Result Date: 05/06/2023 CLINICAL DATA:  Pelvic pain. EXAM: TRANSABDOMINAL AND TRANSVAGINAL ULTRASOUND OF PELVIS DOPPLER ULTRASOUND OF OVARIES TECHNIQUE: Both transabdominal and transvaginal ultrasound examinations of the pelvis were performed. Transabdominal technique was performed for global imaging of the pelvis including uterus, ovaries, adnexal regions, and pelvic cul-de-sac. It was necessary to proceed with endovaginal exam following the transabdominal exam to visualize the ovaries. Color and duplex Doppler ultrasound was utilized to evaluate blood flow to the ovaries. COMPARISON:  None Available. FINDINGS: Uterus Measurements: 11 x 6.2 x 6.3 cm = volume: 226 mL. No fibroids or other mass visualized. Endometrium Thickness: 18 mm.  No focal abnormality visualized. Right ovary  Measurements: 2.1 x 1.5 x 1.5 cm = volume: 1.9 mL. Normal appearance/no adnexal mass. Left ovary Measurements: 2.3 x 1.2 x 2.0 cm = volume: 4.4 mL. Normal appearance/no adnexal mass. Pulsed Doppler evaluation of both ovaries demonstrates normal low-resistance arterial and venous waveforms. Other findings No abnormal free fluid. IMPRESSION: Thickened endometrium which can be normal depending on timing of the patient's menstrual cycle. Correlation suggested clinically. Electronically Signed   By: Irish Lack M.D.   On: 05/06/2023 15:53    Procedures .Foreign Body Removal  Date/Time: 05/06/2023 7:26 PM  Performed by: Gareth Eagle, PA-C Authorized by: Gareth Eagle, PA-C  Consent: Verbal consent obtained. Risks and benefits: risks, benefits and alternatives were discussed Consent given by: patient Patient understanding: patient states understanding of the procedure being performed Patient identity confirmed: verbally with patient Time out: Immediately prior to procedure a "time out" was called to verify the correct patient, procedure, equipment, support staff and site/side marked as required. Body area: vagina  Sedation: Patient sedated: no  Patient restrained: no Localization method: speculum Removal mechanism: forceps Complexity: simple 1 objects recovered. Objects recovered: Likely decidual cast Post-procedure assessment: foreign body removed      Medications Ordered in ED Medications  acetaminophen (TYLENOL) tablet 1,000 mg (1,000 mg Oral Not Given 05/06/23 1442)  HYDROmorphone (DILAUDID) injection 1 mg (1 mg Intravenous Given 05/06/23 1454)  sodium chloride 0.9 % bolus 1,000 mL (0 mLs Intravenous Stopped 05/06/23 1643)  ondansetron (ZOFRAN) injection 4 mg (4 mg Intravenous Given 05/06/23 1454)  ketorolac (TORADOL) 15 MG/ML injection 15 mg (15 mg Intravenous Given 05/06/23 1643)  iohexol (OMNIPAQUE) 300 MG/ML solution 100 mL (100 mLs Intravenous Contrast Given 05/06/23 1737)     ED Course/ Medical Decision Making/ A&P                             Medical Decision Making Amount and/or Complexity of Data Reviewed Labs: ordered. Radiology: ordered.  Risk OTC drugs. Prescription drug management.   Initial Impression and Ddx 42 year old well-appearing female presenting for pelvic pain.  Exam notable for left lower quadrant tenderness DDx includes ovarian torsion, ectopic pregnancy, diverticulitis, appendicitis, other intra-abdominal infection, symptomatic anemia Patient PMH that increases complexity of ED encounter:  recent history of left-sided ovarian cyst, recurrent vaginal bleeding and tubal ligation  Interpretation of Diagnostics I independent reviewed and interpreted the labs as followed: Leukocytosis, positive nitrites, pyuria, bacteriuria  - I independently visualized the following imaging with scope of interpretation limited to determining acute life threatening conditions related to emergency care: US pelvic which revealed no acute findings, CT abdomen pelvis revealed   Prominent hypodensity along the endometrium, cervix, and  extending into the vagina, internal density  of about 41 Hounsfield  units, and mostly homogeneous. The most likely cause is blood  products.   Patient Reassessment and Ultimate Disposition/Management Patient's pain improved after treatment.  Fortunately ultrasound was negative for torsion.  Ectopic pregnancy also unlikely given negative beta hCG.  On pelvic exam there was obvious foreign body in the vaginal vault, was able to extract a.  Appears to be decidual cast.  This could be contributing to her bleeding as it was as evidenced by CT scan extending through the cervix.  Sent off for pathology with plans for the results to be sent to her gynecologist Dr. Briscoe Deutscher.  At this time do not have concerns for symptomatic anemia given stable hemoglobin and patient appears well with stable vitals and not endorsing symptoms associated with  anemia.  Discussed pertinent return precautions for active bleed.  Otherwise advised to follow-up with her gynecologist as soon as possible.  Vital stable at discharge.  Discharged home.  Patient management required discussion with the following services or consulting groups:  None  Complexity of Problems Addressed Acute complicated illness or Injury  Additional Data Reviewed and Analyzed Further history obtained from: Past medical history and medications listed in the EMR, Prior ED visit notes, Recent discharge summary, and Recent Consult notes  Patient Encounter Risk Assessment None         Final Clinical Impression(s) / ED Diagnoses Final diagnoses:  Vaginal bleeding  Foreign body in vagina, initial encounter    Rx / DC Orders ED Discharge Orders     None         Gareth Eagle, PA-C 05/06/23 1929    Lorre Nick, MD 05/07/23 2250

## 2023-05-08 ENCOUNTER — Encounter: Payer: Self-pay | Admitting: Obstetrics and Gynecology

## 2023-05-09 ENCOUNTER — Telehealth: Payer: Self-pay | Admitting: Family Medicine

## 2023-05-09 ENCOUNTER — Other Ambulatory Visit: Payer: Self-pay | Admitting: Obstetrics and Gynecology

## 2023-05-09 DIAGNOSIS — N939 Abnormal uterine and vaginal bleeding, unspecified: Secondary | ICD-10-CM

## 2023-05-09 DIAGNOSIS — N946 Dysmenorrhea, unspecified: Secondary | ICD-10-CM

## 2023-05-09 MED ORDER — CYCLOBENZAPRINE HCL 10 MG PO TABS
10.0000 mg | ORAL_TABLET | Freq: Two times a day (BID) | ORAL | 1 refills | Status: DC | PRN
Start: 2023-05-09 — End: 2023-07-06

## 2023-05-09 MED ORDER — NORETHINDRONE ACETATE 5 MG PO TABS
10.0000 mg | ORAL_TABLET | Freq: Every day | ORAL | 2 refills | Status: DC
Start: 2023-05-09 — End: 2024-07-06

## 2023-05-09 NOTE — Telephone Encounter (Signed)
Pt went to [facility] hospital for heavy bleeding and painful cramps. She says that decidual cast came out during vagina exam. Pt says that she is still in pain today and wants to know if it is normal. Pt asking to talk to nurse. Please call back
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
## 2023-05-10 ENCOUNTER — Ambulatory Visit: Payer: BC Managed Care – PPO | Attending: Obstetrics and Gynecology | Admitting: Physical Therapy

## 2023-05-10 ENCOUNTER — Other Ambulatory Visit: Payer: Self-pay

## 2023-05-10 ENCOUNTER — Encounter: Payer: Self-pay | Admitting: Physical Therapy

## 2023-05-10 ENCOUNTER — Ambulatory Visit: Payer: BC Managed Care – PPO | Admitting: Physician Assistant

## 2023-05-10 DIAGNOSIS — N941 Unspecified dyspareunia: Secondary | ICD-10-CM | POA: Insufficient documentation

## 2023-05-10 DIAGNOSIS — R279 Unspecified lack of coordination: Secondary | ICD-10-CM | POA: Diagnosis not present

## 2023-05-10 DIAGNOSIS — K5900 Constipation, unspecified: Secondary | ICD-10-CM | POA: Insufficient documentation

## 2023-05-10 DIAGNOSIS — R293 Abnormal posture: Secondary | ICD-10-CM | POA: Diagnosis not present

## 2023-05-10 DIAGNOSIS — M62838 Other muscle spasm: Secondary | ICD-10-CM | POA: Insufficient documentation

## 2023-05-10 DIAGNOSIS — M6281 Muscle weakness (generalized): Secondary | ICD-10-CM | POA: Insufficient documentation

## 2023-05-10 LAB — SURGICAL PATHOLOGY

## 2023-05-10 NOTE — Telephone Encounter (Signed)
Pt returned missed call. She says she has already reached out to her GYN and they are not giving her any help or answers.

## 2023-05-10 NOTE — Telephone Encounter (Signed)
Returned patients call and spoke with patient and she is aware she needs to cal GYN and get advice what to do.  Patient voiced understanding.

## 2023-05-10 NOTE — Telephone Encounter (Signed)
Lmtcb   When patient calls back let her know she needs to follow up with GYN

## 2023-05-12 ENCOUNTER — Encounter: Payer: Self-pay | Admitting: Obstetrics and Gynecology

## 2023-05-13 ENCOUNTER — Ambulatory Visit (INDEPENDENT_AMBULATORY_CARE_PROVIDER_SITE_OTHER): Payer: BC Managed Care – PPO | Admitting: Family Medicine

## 2023-05-13 ENCOUNTER — Encounter: Payer: Self-pay | Admitting: Family Medicine

## 2023-05-13 VITALS — BP 127/86 | HR 94 | Temp 98.5°F | Ht 62.0 in | Wt 170.4 lb

## 2023-05-13 DIAGNOSIS — E119 Type 2 diabetes mellitus without complications: Secondary | ICD-10-CM

## 2023-05-13 DIAGNOSIS — N926 Irregular menstruation, unspecified: Secondary | ICD-10-CM | POA: Diagnosis not present

## 2023-05-13 DIAGNOSIS — Z7984 Long term (current) use of oral hypoglycemic drugs: Secondary | ICD-10-CM

## 2023-05-13 LAB — BAYER DCA HB A1C WAIVED: HB A1C (BAYER DCA - WAIVED): 6.8 % — ABNORMAL HIGH (ref 4.8–5.6)

## 2023-05-13 MED ORDER — METFORMIN HCL 850 MG PO TABS
850.0000 mg | ORAL_TABLET | Freq: Two times a day (BID) | ORAL | 2 refills | Status: DC
Start: 2023-05-13 — End: 2024-01-20

## 2023-05-13 NOTE — Progress Notes (Signed)
Subjective:  Patient ID: Allison Galloway, female    DOB: 27-Jan-1981, 42 y.o.   MRN: [id number]  Patient Care Team: [name], FNP as PCP - General (Family Medicine) Christell Constant, MD as PCP - Cardiology (Cardiology) Tanda Rockers, NP as Nurse Practitioner (Radiology) Michaelle Copas, MD as Referring Physician (Optometry)   Chief Complaint:  Diabetes (3 month follow up /) and Menorrhagia (Since decidual cast came out.  GYN does not know why and said its rare. GYN is supposed to let her know about hysterectomy. )   HPI: Allison Galloway is a 42 y.o. female presenting on 05/13/2023 for Diabetes (3 month follow up /) and Menorrhagia (Since decidual cast came out.  GYN does not know why and said its rare. GYN is supposed to let her know about hysterectomy. )   Diabetes She presents for her follow-up diabetic visit. She has type 2 diabetes mellitus. Her disease course has been stable. Pertinent negatives for hypoglycemia include no confusion, dizziness, headaches, hunger, mood changes, nervousness/anxiousness, pallor, seizures, sleepiness, speech difficulty, sweats or tremors. Pertinent negatives for diabetes include no blurred vision, no chest pain, no fatigue, no foot paresthesias, no foot ulcerations, no polydipsia, no polyphagia, no polyuria, no visual change, no weakness and no weight loss. There are no diabetic complications. Risk factors for coronary artery disease include dyslipidemia, obesity and sedentary lifestyle. Current diabetic treatment includes oral agent (dual therapy). She is compliant with treatment all of the time. She is following a diabetic diet. When asked about meal planning, she reported none.   2. Irregular periods Closely followed by GYN and is currently undergoing pelvic floor PT. She is scheduled to have a hysterectomy due to continued abnormal menses and pain.      Relevant past medical, surgical, family, and social history reviewed and updated as  indicated.  Allergies and medications reviewed and updated. Data reviewed: Chart in Epic.   Past Medical History:  Diagnosis Date   Anxiety    Constipation    Conversion disorder    Depression     Past Surgical History:  Procedure Laterality Date   ARTHROSCOPIC REPAIR ACL     CESAREAN SECTION     3   KNEE SURGERY     TUBAL LIGATION      Social History   Socioeconomic History   Marital status: Single    Spouse name: Not on file   Number of children: Not on file   Years of education: Not on file   Highest education level: Not on file  Occupational History   Not on file  Tobacco Use   Smoking status: Former    Packs/day: .25    Types: Cigarettes    Quit date: 08/2021    Years since quitting: 1.7   Smokeless tobacco: Never  Vaping Use   Vaping Use: Former  Substance and Sexual Activity   Alcohol use: No   Drug use: Yes    Types: Marijuana   Sexual activity: Yes    Partners: Male    Birth control/protection: Surgical    Comment: tubal, menarche 42yo, sexual debut 42yo  Other Topics Concern   Not on file  Social History Narrative   Not on file   Social Determinants of Health   Financial Resource Strain: Not on file  Food Insecurity: Not on file  Transportation Needs: Not on file  Physical Activity: Not on file  Stress: Not on file  Social Connections: Not on file  Intimate Partner Violence: Not on file    Outpatient Encounter Medications as of 05/13/2023  Medication Sig   blood glucose meter kit and supplies Dispense based on patient and insurance preference. Use up to four times daily as directed. (FOR ICD-10 E10.9, E11.9).   busPIRone (BUSPAR) 5 MG tablet Take 5 mg by mouth 3 (three) times daily as needed (for anxiety).   cyclobenzaprine (FLEXERIL) 10 MG tablet Take 1 tablet (10 mg total) by mouth 2 (two) times daily as needed for muscle spasms.   enalapril (VASOTEC) 2.5 MG tablet Take 1 tablet (2.5 mg total) by mouth daily.   ibuprofen (ADVIL) 200 MG  tablet Take 200-400 mg by mouth every 6 (six) hours as needed for mild pain, headache or cramping.   linaclotide (LINZESS) 145 MCG CAPS capsule Take 1 capsule (145 mcg total) by mouth daily before breakfast.   norethindrone (AYGESTIN) 5 MG tablet Take 2 tablets (10 mg total) by mouth daily.   ondansetron (ZOFRAN) 4 MG tablet Take 1 tablet (4 mg total) by mouth every 8 (eight) hours as needed for nausea or vomiting.   Peppermint Oil (IBGARD) 90 MG CPCR Take as directed.   TYLENOL 500 MG tablet Take 500-1,000 mg by mouth every 6 (six) hours as needed for mild pain or headache.   [DISCONTINUED] metFORMIN (GLUCOPHAGE) 850 MG tablet Take 1 tablet (850 mg total) by mouth 2 (two) times daily with a meal.   metFORMIN (GLUCOPHAGE) 850 MG tablet Take 1 tablet (850 mg total) by mouth 2 (two) times daily with a meal.   No facility-administered encounter medications on file as of 05/13/2023.    No Known Allergies  Review of Systems  Constitutional:  Negative for activity change, appetite change, chills, diaphoresis, fatigue, fever, unexpected weight change and weight loss.  HENT: Negative.    Eyes: Negative.  Negative for blurred vision, photophobia and visual disturbance.  Respiratory:  Negative for cough, chest tightness and shortness of breath.   Cardiovascular:  Negative for chest pain, palpitations and leg swelling.  Gastrointestinal:  Negative for abdominal pain, blood in stool, constipation, diarrhea, nausea and vomiting.  Endocrine: Negative.  Negative for polydipsia, polyphagia and polyuria.  Genitourinary:  Positive for dyspareunia, menstrual problem and vaginal pain. Negative for decreased urine volume, dysuria, enuresis, flank pain, frequency, genital sores, hematuria, pelvic pain, urgency, vaginal bleeding and vaginal discharge.  Musculoskeletal:  Negative for arthralgias and myalgias.  Skin: Negative.  Negative for pallor.  Allergic/Immunologic: Negative.   Neurological:  Negative for  dizziness, tremors, seizures, syncope, facial asymmetry, speech difficulty, weakness, light-headedness, numbness and headaches.  Hematological: Negative.   Psychiatric/Behavioral:  Negative for confusion, hallucinations, sleep disturbance and suicidal ideas. The patient is not nervous/anxious.   All other systems reviewed and are negative.       Objective:  BP 127/86   Pulse 94   Temp 98.5 F (36.9 C) (Temporal)   Ht 5\' 2"  (1.575 m)   Wt 170 lb 6.4 oz (77.3 kg)   SpO2 100%   BMI 31.17 kg/m    Wt Readings from Last 3 Encounters:  05/13/23 170 lb 6.4 oz (77.3 kg)  05/06/23 173 lb (78.5 kg)  05/02/23 173 lb (78.5 kg)    Physical Exam Vitals and nursing note reviewed.  Constitutional:      General: She is not in acute distress.    Appearance: Normal appearance. She is obese. She is not ill-appearing, toxic-appearing or diaphoretic.     Comments: Doing well today, no difficulties  walking or talking, no tremors or dyskinesias  HENT:     Head: Normocephalic and atraumatic.     Mouth/Throat:     Mouth: Mucous membranes are moist.     Pharynx: Oropharynx is clear.  Eyes:     Conjunctiva/sclera: Conjunctivae normal.     Pupils: Pupils are equal, round, and reactive to light.  Cardiovascular:     Rate and Rhythm: Normal rate and regular rhythm.     Pulses:          Dorsalis pedis pulses are 2+ on the right side and 2+ on the left side.       Posterior tibial pulses are 2+ on the right side and 2+ on the left side.     Heart sounds: Normal heart sounds.  Pulmonary:     Effort: Pulmonary effort is normal.     Breath sounds: Normal breath sounds.  Musculoskeletal:        General: Normal range of motion.     Right foot: Normal range of motion. No deformity, bunion, Charcot foot, foot drop or prominent metatarsal heads.     Left foot: Normal range of motion. No deformity, bunion, Charcot foot, foot drop or prominent metatarsal heads.  Feet:     Right foot:     Protective  Sensation: 10 sites tested.  10 sites sensed.     Skin integrity: Skin integrity normal.     Left foot:     Protective Sensation: 10 sites tested.  10 sites sensed.     Skin integrity: Skin integrity normal.  Skin:    General: Skin is warm and dry.     Capillary Refill: Capillary refill takes less than 2 seconds.  Neurological:     General: No focal deficit present.     Mental Status: She is alert and oriented to person, place, and time.     Gait: Gait normal.  Psychiatric:        Mood and Affect: Mood normal.        Behavior: Behavior normal.        Thought Content: Thought content normal.        Judgment: Judgment normal.     Results for orders placed or performed in visit on 05/13/23  Bayer DCA Hb A1c Waived  Result Value Ref Range   HB A1C (BAYER DCA - WAIVED) 6.8 (H) 4.8 - 5.6 %       Pertinent labs & imaging results that were available during my care of the patient were reviewed by me and considered in my medical decision making.  Assessment & Plan:  Shalesha was seen today for diabetes and menorrhagia.  Diagnoses and all orders for this visit:  Type 2 diabetes mellitus without complication, without long-term current use of insulin (HCC) A1C 6.8 in office. Well controlled, continue current regimen. Diet and exercise encouraged.  -     Bayer DCA Hb A1c Waived -     metFORMIN (GLUCOPHAGE) 850 MG tablet; Take 1 tablet (850 mg total) by mouth 2 (two) times daily with a meal.  Irregular periods Is followed by GYN and is scheduled to have a hysterectomy.  -     metFORMIN (GLUCOPHAGE) 850 MG tablet; Take 1 tablet (850 mg total) by mouth 2 (two) times daily with a meal.     Continue all other maintenance medications.  Follow up plan: Return in about 3 months (around 08/13/2023) for DM.   Continue healthy lifestyle choices, including diet (rich in fruits,  vegetables, and lean proteins, and low in salt and simple carbohydrates) and exercise (at least 30 minutes of moderate  physical activity daily).  Educational handout given for DM  The above assessment and management plan was discussed with the patient. The patient verbalized understanding of and has agreed to the management plan. Patient is aware to call the clinic if they develop any new symptoms or if symptoms persist or worsen. Patient is aware when to return to the clinic for a follow-up visit. Patient educated on when it is appropriate to go to the emergency department.   Kari Baars, FNP-C [facility] [phone]

## 2023-05-13 NOTE — Patient Instructions (Addendum)

## 2023-05-14 ENCOUNTER — Other Ambulatory Visit: Payer: Self-pay | Admitting: Obstetrics and Gynecology

## 2023-05-16 NOTE — Telephone Encounter (Signed)
She was given 60 tablets of Aygestin on 05/09/23. Refill isn't appropriate

## 2023-05-16 NOTE — Telephone Encounter (Signed)
Medication refill request: aygestin 5 mg  Last ov:   04/14/23 with BS  Next AEX: not scheduled  Last MMG (if hormonal medication request): n/a Refill authorized: Medication was discontinued by Dr. Briscoe Deutscher 05/03/23  Please advise.

## 2023-05-19 ENCOUNTER — Ambulatory Visit: Payer: BC Managed Care – PPO | Admitting: Physical Therapy

## 2023-05-19 DIAGNOSIS — M62838 Other muscle spasm: Secondary | ICD-10-CM

## 2023-05-19 DIAGNOSIS — N941 Unspecified dyspareunia: Secondary | ICD-10-CM | POA: Diagnosis not present

## 2023-05-19 DIAGNOSIS — R293 Abnormal posture: Secondary | ICD-10-CM | POA: Diagnosis not present

## 2023-05-19 DIAGNOSIS — M6281 Muscle weakness (generalized): Secondary | ICD-10-CM

## 2023-05-19 DIAGNOSIS — R279 Unspecified lack of coordination: Secondary | ICD-10-CM | POA: Diagnosis not present

## 2023-05-19 DIAGNOSIS — K5900 Constipation, unspecified: Secondary | ICD-10-CM | POA: Diagnosis not present
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
## 2023-05-23 ENCOUNTER — Telehealth: Payer: Self-pay

## 2023-05-23 NOTE — Telephone Encounter (Signed)
Patient called to schedule her gyn procedure w/ [facility]. Advised patient that she's on my list of patients to schedule once Dr. Briscoe Deutscher August schedule is available. Confirmed pt is scheduled for a  robotic assisted total laparoscopic hysterectomy. Patient asked what day of the week are those types of procedures performed? I let her know Robots are normally completed on Wednesdays. Patient was appreciative and is looking forward to being scheduled. Patient acknowledged that Dr. Briscoe Deutscher proactively let her know it could take a awhile to to schedule.

## 2023-05-24 ENCOUNTER — Ambulatory Visit: Payer: BC Managed Care – PPO | Admitting: Physical Therapy

## 2023-05-25 ENCOUNTER — Ambulatory Visit: Payer: BC Managed Care – PPO | Admitting: Physical Therapy

## 2023-05-30 DIAGNOSIS — Z419 Encounter for procedure for purposes other than remedying health state, unspecified: Secondary | ICD-10-CM | POA: Diagnosis not present

## 2023-06-03 ENCOUNTER — Encounter: Payer: Self-pay | Admitting: Obstetrics and Gynecology

## 2023-06-03 ENCOUNTER — Telehealth: Payer: Self-pay

## 2023-06-03 NOTE — Telephone Encounter (Signed)
Patient called to have her procedure scheduled w/ [facility]. Explained to the patient that the room needed in the OR wasn't available. Let the patient know that I would check both the provider schedule and OR schedule to see if anything has become available and let her know.

## 2023-06-07 ENCOUNTER — Ambulatory Visit: Payer: BC Managed Care – PPO | Admitting: Physical Therapy

## 2023-06-07 ENCOUNTER — Encounter: Payer: Self-pay | Admitting: *Deleted

## 2023-06-09 ENCOUNTER — Encounter: Payer: BC Managed Care – PPO | Admitting: Physical Therapy

## 2023-06-13 ENCOUNTER — Encounter (HOSPITAL_BASED_OUTPATIENT_CLINIC_OR_DEPARTMENT_OTHER): Payer: Self-pay | Admitting: Obstetrics and Gynecology

## 2023-06-13 ENCOUNTER — Other Ambulatory Visit: Payer: Self-pay

## 2023-06-13 DIAGNOSIS — Z01818 Encounter for other preprocedural examination: Secondary | ICD-10-CM | POA: Diagnosis not present

## 2023-06-13 DIAGNOSIS — R9431 Abnormal electrocardiogram [ECG] [EKG]: Secondary | ICD-10-CM | POA: Diagnosis not present

## 2023-06-13 NOTE — Progress Notes (Addendum)
Your procedure is scheduled on Tuesday, 07/05/23.  Report to [facility] [place] AT  10:00 AM.   Call this number if you have problems the morning of surgery  [phone].   OUR ADDRESS IS [address].  WE ARE LOCATED IN [facility].  PLEASE BRING YOUR INSURANCE CARD AND PHOTO ID DAY OF SURGERY.  ONLY 2 PEOPLE ARE ALLOWED IN  WAITING  ROOM                                      REMEMBER:  DO NOT EAT FOOD, CANDY GUM OR MINTS  AFTER MIDNIGHT THE NIGHT BEFORE YOUR SURGERY . YOU MAY HAVE CLEAR LIQUIDS FROM MIDNIGHT THE NIGHT BEFORE YOUR SURGERY UNTIL  9:00 AM. NO CLEAR LIQUIDS AFTER   9:00 AM DAY OF SURGERY.  YOU MAY  BRUSH YOUR TEETH MORNING OF SURGERY AND RINSE YOUR MOUTH OUT, NO CHEWING GUM CANDY OR MINTS.     CLEAR LIQUID DIET    Allowed      Water                                                                   Coffee and tea, regular and decaf  (NO cream or milk products of any type, may sweeten)                         Carbonated beverages, regular and diet                                    Sports drinks like Gatorade _____________________________________________________________________     TAKE ONLY THESE MEDICATIONS MORNING OF SURGERY: Buspar (buspirone) if needed, Flexeril if needed, Norethindrone  Do NOT take Metformin on the morning of surgery.                                        DO NOT WEAR JEWERLY/  METAL/  PIERCINGS (INCLUDING NO PLASTIC PIERCINGS) DO NOT WEAR LOTIONS, POWDERS, PERFUMES OR NAIL POLISH ON YOUR FINGERNAILS. TOENAIL POLISH IS OK TO WEAR. DO NOT SHAVE FOR 48 HOURS PRIOR TO DAY OF SURGERY.  CONTACTS, GLASSES, OR DENTURES MAY NOT BE WORN TO SURGERY.  REMEMBER: NO SMOKING, VAPING ,  DRUGS OR ALCOHOL FOR 24 HOURS BEFORE YOUR SURGERY.                                    [facility] IS NOT RESPONSIBLE  FOR ANY BELONGINGS.                                                                    Marland Kitchen  [facility] - Preparing for Surgery Before surgery, you can play an important role.  Because skin is not sterile, your skin needs to be as free of germs as possible.  You can reduce the number of germs on your skin by washing with CHG (chlorahexidine gluconate) soap before surgery.  CHG is an antiseptic cleaner which kills germs and bonds with the skin to continue killing germs even after washing. Please DO NOT use if you have an allergy to CHG or antibacterial soaps.  If your skin becomes reddened/irritated stop using the CHG and inform your nurse when you arrive at Short Stay. Do not shave (including legs and underarms) for at least 48 hours prior to the first CHG shower.  You may shave your face/neck. Please follow these instructions carefully:  1.  Shower with CHG Soap the night before surgery and the  morning of Surgery.  2.  If you choose to wash your hair, wash your hair first as usual with your  normal  shampoo.  3.  After you shampoo, rinse your hair and body thoroughly to remove the  shampoo.                                        4.  Use CHG as you would any other liquid soap.  You can apply chg directly  to the skin and wash , chg soap provided, night before and morning of your surgery.  5.  Apply the CHG Soap to your body ONLY FROM THE NECK DOWN.   Do not use on face/ open                           Wound or open sores. Avoid contact with eyes, ears mouth and genitals (private parts).                       Wash face,  Genitals (private parts) with your normal soap.             6.  Wash thoroughly, paying special attention to the area where your surgery  will be performed.  7.  Thoroughly rinse your body with warm water from the neck down.  8.  DO NOT shower/wash with your normal soap after using and rinsing off  the CHG Soap.             9.  Pat yourself dry with a clean towel.            10.  Wear clean pajamas.            11.  Place clean sheets on your bed the night of your first shower  and do not  sleep with pets. Day of Surgery : Do not apply any lotions/ powders the morning of surgery.  Please wear clean clothes to the hospital/surgery center.  IF YOU HAVE ANY SKIN IRRITATION OR PROBLEMS WITH THE SURGICAL SOAP, PLEASE GET A BAR OF GOLD DIAL SOAP AND SHOWER THE NIGHT BEFORE YOUR SURGERY AND THE MORNING OF YOUR SURGERY. PLEASE LET THE NURSE KNOW MORNING OF YOUR SURGERY IF YOU HAD ANY PROBLEMS WITH THE SURGICAL SOAP.   YOUR SURGEON MAY HAVE REQUESTED EXTENDED RECOVERY TIME AFTER YOUR SURGERY. IT COULD BE A  JUST A FEW HOURS  UP TO AN OVERNIGHT STAY.  YOUR SURGEON SHOULD HAVE  DISCUSSED THIS WITH YOU PRIOR TO YOUR SURGERY. IN THE EVENT YOU NEED TO STAY OVERNIGHT PLEASE REFER TO THE FOLLOWING GUIDELINES. YOU MAY HAVE UP TO 4 VISITORS  MAY VISIT IN THE EXTENDED RECOVERY ROOM UNTIL 800 PM ONLY.  ONE  VISITOR AGE 14 AND OVER MAY SPEND THE NIGHT AND MUST BE IN EXTENDED RECOVERY ROOM NO LATER THAN 800 PM . YOUR DISCHARGE TIME AFTER YOU SPEND THE NIGHT IS 900 AM THE MORNING AFTER YOUR SURGERY. YOU MAY PACK A SMALL OVERNIGHT BAG WITH TOILETRIES FOR YOUR OVERNIGHT STAY IF YOU WISH.  REGARDLESS OF IF YOU STAY OVER NIGHT OR ARE DISCHARGED THE SAME DAY YOU WILL BE REQUIRED TO HAVE A RESPONSIBLE ADULT (18 YRS OLD OR OLDER) STAY WITH YOU FOR AT LEAST THE FIRST 24 HOURS  YOUR PRESCRIPTION MEDICATIONS WILL BE PROVIDED DURING YOUR HOSPITAL STAY.  ________________________________________________________________________                                                        QUESTIONS Mechele Claude PRE OP NURSE PHONE [phone].

## 2023-06-13 NOTE — Progress Notes (Addendum)
Spoke w/ via phone for pre-op interview---Allison Galloway needs dos----urine pregnancy per anesthesia, surgeon orders pending               Galloway results------07/01/23 Galloway appt for cbc, type & screen, bmp,EKG, 03/03/22 echocardiogram in Epic COVID test -----patient states asymptomatic no test needed Arrive at -------1000 on Tuesday, 07/05/23 NPO after MN NO Solid Food.  Clear liquids from MN until---0900 Med rec completed Medications to take morning of surgery -----Buspar prn, Flexeril prn, Norethindrone, Zofran prn Diabetic medication -----Hold Metformin on the morning of surgery. Patient instructed no nail polish to be worn day of surgery Patient instructed to bring photo id and insurance card day of surgery Patient aware to have Driver (ride ) / caregiver    for 24 hours after surgery - mother, Allison Galloway Patient Special Instructions -----Extended / overnight stay instructions given. Pre-Op special Instructions -----Requested orders from Dr. Briscoe Deutscher on 06/13/23 via Epic IB. Patient verbalized understanding of instructions that were given at this phone interview. Patient denies shortness of breath, chest pain, fever, cough at this phone interview.  Patient has a hx of multiple symptoms including chest tightness, sob, tremors, left-sided weakness, speech difficulty. She has had a negative cardiac, pulmonary and neuro imaging workup. Patient's symptoms are thought to all be related to conversion syndrome. Patient currently denies any cp or sob. She also states that she is doing better with other symptoms. She follows w/ PCP, Allison Silvius, NP. She was referred to psychiatry. Patient states that she has not been able to get in with a psychiatrist who specializes in conversion disorder.

## 2023-06-14 ENCOUNTER — Encounter: Payer: BC Managed Care – PPO | Admitting: Physical Therapy

## 2023-06-20 ENCOUNTER — Ambulatory Visit: Payer: BC Managed Care – PPO | Admitting: Physical Therapy

## 2023-06-29 ENCOUNTER — Encounter: Payer: BC Managed Care – PPO | Admitting: Physical Therapy

## 2023-06-30 DIAGNOSIS — Z419 Encounter for procedure for purposes other than remedying health state, unspecified: Secondary | ICD-10-CM | POA: Diagnosis not present

## 2023-07-01 ENCOUNTER — Encounter (HOSPITAL_COMMUNITY)
Admission: RE | Admit: 2023-07-01 | Discharge: 2023-07-01 | Disposition: A | Discharge status: Home / Self Care | Payer: BC Managed Care – PPO | Source: Ambulatory Visit | Attending: Obstetrics and Gynecology | Admitting: Obstetrics and Gynecology

## 2023-07-01 DIAGNOSIS — R9431 Abnormal electrocardiogram [ECG] [EKG]: Secondary | ICD-10-CM | POA: Insufficient documentation

## 2023-07-01 DIAGNOSIS — Z01818 Encounter for other preprocedural examination: Secondary | ICD-10-CM

## 2023-07-01 LAB — CBC
HCT: 29.9 % — ABNORMAL LOW (ref 36.0–46.0)
Hemoglobin: 8.5 g/dL — ABNORMAL LOW (ref 12.0–15.0)
MCH: 19.8 pg — ABNORMAL LOW (ref 26.0–34.0)
MCHC: 28.4 g/dL — ABNORMAL LOW (ref 30.0–36.0)
MCV: 69.7 fL — ABNORMAL LOW (ref 80.0–100.0)
Platelets: 397 10*3/uL (ref 150–400)
RBC: 4.29 MIL/uL (ref 3.87–5.11)
RDW: 16.9 % — ABNORMAL HIGH (ref 11.5–15.5)
WBC: 11.1 10*3/uL — ABNORMAL HIGH (ref 4.0–10.5)
nRBC: 0 % (ref 0.0–0.2)

## 2023-07-01 LAB — BASIC METABOLIC PANEL
Anion gap: 9 (ref 5–15)
BUN: 11 mg/dL (ref 6–20)
CO2: 21 mmol/L — ABNORMAL LOW (ref 22–32)
Calcium: 9.3 mg/dL (ref 8.9–10.3)
Chloride: 106 mmol/L (ref 98–111)
Creatinine, Ser: 0.86 mg/dL (ref 0.44–1.00)
GFR, Estimated: >60 mL/min (ref >60)
Glucose, Bld: 163 mg/dL — ABNORMAL HIGH (ref 70–99)
Potassium: 4.5 mmol/L (ref 3.5–5.1)
Sodium: 136 mmol/L (ref 135–145)

## 2023-07-01 LAB — TYPE AND SCREEN
ABO/RH(D): B POS
Antibody Screen: NEGATIVE

## 2023-07-01 NOTE — Progress Notes (Addendum)
Secure chat with dejuana bigelow, dejuana to let dr Briscoe Deutscher pt hemaglobin was 8.5 on lab work today.secure chat  with dejuana begelow ok to proceed with hemaglobin 8.5 unless pt symptomatic per dr Briscoe Deutscher.

## 2023-07-05 ENCOUNTER — Other Ambulatory Visit: Payer: Self-pay

## 2023-07-05 ENCOUNTER — Ambulatory Visit (HOSPITAL_BASED_OUTPATIENT_CLINIC_OR_DEPARTMENT_OTHER)
Admission: RE | Admit: 2023-07-05 | Discharge: 2023-07-05 | Disposition: A | Discharge status: Home / Self Care | Payer: BC Managed Care – PPO | Source: Ambulatory Visit | Attending: Obstetrics and Gynecology | Admitting: Obstetrics and Gynecology

## 2023-07-05 ENCOUNTER — Encounter (HOSPITAL_BASED_OUTPATIENT_CLINIC_OR_DEPARTMENT_OTHER)
Admission: RE | Disposition: A | Discharge status: Home / Self Care | Payer: Self-pay | Source: Ambulatory Visit | Attending: Obstetrics and Gynecology

## 2023-07-05 ENCOUNTER — Other Ambulatory Visit (HOSPITAL_COMMUNITY): Payer: Self-pay

## 2023-07-05 ENCOUNTER — Encounter (HOSPITAL_BASED_OUTPATIENT_CLINIC_OR_DEPARTMENT_OTHER): Payer: Self-pay | Admitting: Obstetrics and Gynecology

## 2023-07-05 ENCOUNTER — Ambulatory Visit (HOSPITAL_BASED_OUTPATIENT_CLINIC_OR_DEPARTMENT_OTHER): Payer: Self-pay | Admitting: Certified Registered Nurse Anesthetist

## 2023-07-05 DIAGNOSIS — N950 Postmenopausal bleeding: Secondary | ICD-10-CM | POA: Diagnosis not present

## 2023-07-05 DIAGNOSIS — N946 Dysmenorrhea, unspecified: Secondary | ICD-10-CM | POA: Diagnosis not present

## 2023-07-05 DIAGNOSIS — N8003 Adenomyosis of the uterus: Secondary | ICD-10-CM | POA: Diagnosis not present

## 2023-07-05 DIAGNOSIS — F17290 Nicotine dependence, other tobacco product, uncomplicated: Secondary | ICD-10-CM | POA: Diagnosis not present

## 2023-07-05 DIAGNOSIS — E119 Type 2 diabetes mellitus without complications: Secondary | ICD-10-CM | POA: Diagnosis not present

## 2023-07-05 DIAGNOSIS — F418 Other specified anxiety disorders: Secondary | ICD-10-CM | POA: Insufficient documentation

## 2023-07-05 DIAGNOSIS — Z7984 Long term (current) use of oral hypoglycemic drugs: Secondary | ICD-10-CM | POA: Diagnosis not present

## 2023-07-05 DIAGNOSIS — N939 Abnormal uterine and vaginal bleeding, unspecified: Secondary | ICD-10-CM | POA: Diagnosis not present

## 2023-07-05 DIAGNOSIS — R102 Pelvic and perineal pain: Secondary | ICD-10-CM | POA: Insufficient documentation

## 2023-07-05 DIAGNOSIS — N938 Other specified abnormal uterine and vaginal bleeding: Secondary | ICD-10-CM

## 2023-07-05 DIAGNOSIS — N838 Other noninflammatory disorders of ovary, fallopian tube and broad ligament: Secondary | ICD-10-CM | POA: Diagnosis not present

## 2023-07-05 DIAGNOSIS — Z01818 Encounter for other preprocedural examination: Secondary | ICD-10-CM

## 2023-07-05 DIAGNOSIS — M1990 Unspecified osteoarthritis, unspecified site: Secondary | ICD-10-CM | POA: Diagnosis not present

## 2023-07-05 HISTORY — DX: Presence of spectacles and contact lenses: Z97.3

## 2023-07-05 HISTORY — PX: CYSTOSCOPY: SHX5120

## 2023-07-05 HISTORY — DX: Shortness of breath: R06.02

## 2023-07-05 HISTORY — DX: Other chronic pain: G89.29

## 2023-07-05 HISTORY — DX: Unspecified osteoarthritis, unspecified site: M19.90

## 2023-07-05 HISTORY — DX: Type 2 diabetes mellitus without complications: E11.9

## 2023-07-05 HISTORY — PX: ROBOTIC ASSISTED TOTAL HYSTERECTOMY WITH BILATERAL SALPINGO OOPHERECTOMY: SHX6086

## 2023-07-05 HISTORY — DX: Headache, unspecified: R51.9

## 2023-07-05 LAB — GLUCOSE, CAPILLARY
Glucose-Capillary: 140 mg/dL — ABNORMAL HIGH (ref 70–99)
Glucose-Capillary: 165 mg/dL — ABNORMAL HIGH (ref 70–99)

## 2023-07-05 LAB — POCT PREGNANCY, URINE: Preg Test, Ur: NEGATIVE

## 2023-07-05 SURGERY — HYSTERECTOMY, TOTAL, ROBOT-ASSISTED, LAPAROSCOPIC, WITH BILATERAL SALPINGO-OOPHORECTOMY
Anesthesia: General | Site: Bladder

## 2023-07-05 MED ORDER — GABAPENTIN 300 MG PO CAPS
300.0000 mg | ORAL_CAPSULE | ORAL | Status: AC
  Administered 2023-07-05: 300 mg via ORAL

## 2023-07-05 MED ORDER — ACETAMINOPHEN 500 MG PO TABS
ORAL_TABLET | ORAL | Status: AC
  Filled 2023-07-05: qty 2

## 2023-07-05 MED ORDER — SCOPOLAMINE 1 MG/3DAYS TD PT72
1.0000 | MEDICATED_PATCH | Freq: Once | TRANSDERMAL | Status: DC
  Administered 2023-07-05: 1.5 mg via TRANSDERMAL

## 2023-07-05 MED ORDER — FENTANYL CITRATE (PF) 250 MCG/5ML IJ SOLN
INTRAMUSCULAR | Status: DC | PRN
  Administered 2023-07-05 (×2): 50 ug via INTRAVENOUS
  Administered 2023-07-05: 25 ug via INTRAVENOUS
  Administered 2023-07-05 (×2): 50 ug via INTRAVENOUS
  Administered 2023-07-05: 25 ug via INTRAVENOUS

## 2023-07-05 MED ORDER — KETOROLAC TROMETHAMINE 30 MG/ML IJ SOLN: 30.0000 mg | Freq: Four times a day (QID) | INTRAMUSCULAR | Status: DC

## 2023-07-05 MED ORDER — IBUPROFEN 200 MG PO TABS: 600.0000 mg | ORAL_TABLET | Freq: Four times a day (QID) | ORAL | Status: DC

## 2023-07-05 MED ORDER — DOCUSATE SODIUM 100 MG PO CAPS
ORAL_CAPSULE | ORAL | Status: AC
  Filled 2023-07-05: qty 1

## 2023-07-05 MED ORDER — SCOPOLAMINE 1 MG/3DAYS TD PT72
MEDICATED_PATCH | TRANSDERMAL | Status: AC
  Filled 2023-07-05: qty 1

## 2023-07-05 MED ORDER — KETOROLAC TROMETHAMINE 30 MG/ML IJ SOLN
INTRAMUSCULAR | Status: DC | PRN
  Administered 2023-07-05: 30 mg via INTRAVENOUS

## 2023-07-05 MED ORDER — ACETAMINOPHEN 500 MG PO TABS
1000.0000 mg | ORAL_TABLET | Freq: Four times a day (QID) | ORAL | Status: DC
  Administered 2023-07-05: 1000 mg via ORAL

## 2023-07-05 MED ORDER — ONDANSETRON HCL 4 MG PO TABS: 4.0000 mg | ORAL_TABLET | Freq: Four times a day (QID) | ORAL | Status: DC | PRN

## 2023-07-05 MED ORDER — BUPIVACAINE HCL (PF) 0.5 % IJ SOLN
INTRAMUSCULAR | Status: DC | PRN
  Administered 2023-07-05: 30 mL

## 2023-07-05 MED ORDER — OXYCODONE HCL 5 MG PO TABS
ORAL_TABLET | ORAL | Status: AC
  Filled 2023-07-05: qty 1

## 2023-07-05 MED ORDER — AMISULPRIDE (ANTIEMETIC) 5 MG/2ML IV SOLN: 10.0000 mg | Freq: Once | INTRAVENOUS | Status: DC | PRN

## 2023-07-05 MED ORDER — DEXAMETHASONE SODIUM PHOSPHATE 10 MG/ML IJ SOLN
INTRAMUSCULAR | Status: DC | PRN
  Administered 2023-07-05: 10 mg via INTRAVENOUS

## 2023-07-05 MED ORDER — MIDAZOLAM HCL 2 MG/2ML IJ SOLN
INTRAMUSCULAR | Status: AC
  Filled 2023-07-05: qty 2

## 2023-07-05 MED ORDER — POVIDONE-IODINE 10 % EX SWAB: 2.0000 | Freq: Once | CUTANEOUS | Status: DC

## 2023-07-05 MED ORDER — CEFAZOLIN SODIUM-DEXTROSE 2-4 GM/100ML-% IV SOLN
2.0000 g | INTRAVENOUS | Status: AC
  Administered 2023-07-05: 2 g via INTRAVENOUS

## 2023-07-05 MED ORDER — LACTATED RINGERS IV SOLN: INTRAVENOUS | Status: DC

## 2023-07-05 MED ORDER — MIDAZOLAM HCL 2 MG/2ML IJ SOLN
INTRAMUSCULAR | Status: DC | PRN
  Administered 2023-07-05: 2 mg via INTRAVENOUS

## 2023-07-05 MED ORDER — LIDOCAINE 2% (20 MG/ML) 5 ML SYRINGE
INTRAMUSCULAR | Status: DC | PRN
  Administered 2023-07-05: 80 mg via INTRAVENOUS

## 2023-07-05 MED ORDER — SIMETHICONE 80 MG PO CHEW: 80.0000 mg | CHEWABLE_TABLET | Freq: Four times a day (QID) | ORAL | Status: DC | PRN

## 2023-07-05 MED ORDER — SUGAMMADEX SODIUM 200 MG/2ML IV SOLN
INTRAVENOUS | Status: DC | PRN
  Administered 2023-07-05: 200 mg via INTRAVENOUS

## 2023-07-05 MED ORDER — POLYETHYLENE GLYCOL 3350 17 GM/SCOOP PO POWD
17.0000 g | Freq: Every day | ORAL | 0 refills | Status: DC | PRN
  Filled 2023-07-05: qty 238, 14d supply, fill #0

## 2023-07-05 MED ORDER — PROPOFOL 10 MG/ML IV BOLUS
INTRAVENOUS | Status: DC | PRN
Start: 2023-07-05 — End: 2023-07-05
  Administered 2023-07-05: 150 mg via INTRAVENOUS

## 2023-07-05 MED ORDER — HYDROMORPHONE HCL 1 MG/ML IJ SOLN: 0.2000 mg | INTRAMUSCULAR | Status: DC | PRN

## 2023-07-05 MED ORDER — ROCURONIUM BROMIDE 10 MG/ML (PF) SYRINGE
PREFILLED_SYRINGE | INTRAVENOUS | Status: DC | PRN
  Administered 2023-07-05: 60 mg via INTRAVENOUS
  Administered 2023-07-05: 20 mg via INTRAVENOUS

## 2023-07-05 MED ORDER — POLYETHYLENE GLYCOL 3350 17 G PO PACK: 17.0000 g | PACK | Freq: Every day | ORAL | Status: DC | PRN

## 2023-07-05 MED ORDER — ACETAMINOPHEN 500 MG PO TABS
1000.0000 mg | ORAL_TABLET | ORAL | Status: AC
  Administered 2023-07-05: 1000 mg via ORAL

## 2023-07-05 MED ORDER — SODIUM CHLORIDE 0.9 % IR SOLN
Status: DC | PRN
  Administered 2023-07-05: 1000 mL via INTRAVESICAL
  Administered 2023-07-05: 1000 mL

## 2023-07-05 MED ORDER — DOCUSATE SODIUM 100 MG PO CAPS
100.0000 mg | ORAL_CAPSULE | Freq: Two times a day (BID) | ORAL | Status: DC
  Administered 2023-07-05: 100 mg via ORAL

## 2023-07-05 MED ORDER — DIPHENHYDRAMINE HCL 50 MG/ML IJ SOLN
INTRAMUSCULAR | Status: DC | PRN
Start: 2023-07-05 — End: 2023-07-05
  Administered 2023-07-05: 12.5 mg via INTRAVENOUS

## 2023-07-05 MED ORDER — OXYCODONE HCL 5 MG PO TABS
5.0000 mg | ORAL_TABLET | ORAL | 0 refills | Status: DC | PRN
  Filled 2023-07-05: qty 20, 4d supply, fill #0

## 2023-07-05 MED ORDER — FENTANYL CITRATE (PF) 100 MCG/2ML IJ SOLN: 25.0000 ug | INTRAMUSCULAR | Status: DC | PRN

## 2023-07-05 MED ORDER — FENTANYL CITRATE (PF) 250 MCG/5ML IJ SOLN
INTRAMUSCULAR | Status: AC
  Filled 2023-07-05: qty 5

## 2023-07-05 MED ORDER — BUPIVACAINE LIPOSOME 1.3 % IJ SUSP
INTRAMUSCULAR | Status: DC | PRN
Start: 2023-07-05 — End: 2023-07-05
  Administered 2023-07-05: 20 mL

## 2023-07-05 MED ORDER — FLUORESCEIN SODIUM 10 % IV SOLN
INTRAVENOUS | Status: DC | PRN
  Administered 2023-07-05: 20 mg via INTRAVENOUS

## 2023-07-05 MED ORDER — ONDANSETRON HCL 4 MG/2ML IJ SOLN: 4.0000 mg | Freq: Once | INTRAMUSCULAR | Status: DC | PRN

## 2023-07-05 MED ORDER — ONDANSETRON HCL 4 MG/2ML IJ SOLN
INTRAMUSCULAR | Status: DC | PRN
Start: 2023-07-05 — End: 2023-07-05
  Administered 2023-07-05: 4 mg via INTRAVENOUS

## 2023-07-05 MED ORDER — CEFAZOLIN SODIUM-DEXTROSE 2-4 GM/100ML-% IV SOLN
INTRAVENOUS | Status: AC
  Filled 2023-07-05: qty 100

## 2023-07-05 MED ORDER — OXYCODONE HCL 5 MG PO TABS
5.0000 mg | ORAL_TABLET | ORAL | Status: DC | PRN
  Administered 2023-07-05 (×2): 5 mg via ORAL

## 2023-07-05 MED ORDER — BUSPIRONE HCL 5 MG PO TABS: 5.0000 mg | ORAL_TABLET | Freq: Three times a day (TID) | ORAL | Status: DC | PRN

## 2023-07-05 MED ORDER — ONDANSETRON HCL 4 MG/2ML IJ SOLN: 4.0000 mg | Freq: Four times a day (QID) | INTRAMUSCULAR | Status: DC | PRN

## 2023-07-05 MED ORDER — ACETAMINOPHEN EXTRA STRENGTH 500 MG PO TABS
1000.0000 mg | ORAL_TABLET | Freq: Four times a day (QID) | ORAL | 2 refills | Status: DC | PRN
  Filled 2023-07-05 (×2): qty 120, 15d supply, fill #0

## 2023-07-05 MED ORDER — STERILE WATER FOR IRRIGATION IR SOLN
Status: DC | PRN
  Administered 2023-07-05: 500 mL

## 2023-07-05 MED ORDER — GABAPENTIN 300 MG PO CAPS
ORAL_CAPSULE | ORAL | Status: AC
  Filled 2023-07-05: qty 1

## 2023-07-05 MED ORDER — IBUPROFEN 800 MG PO TABS
800.0000 mg | ORAL_TABLET | Freq: Three times a day (TID) | ORAL | 2 refills | Status: DC | PRN
  Filled 2023-07-05: qty 60, 20d supply, fill #0

## 2023-07-05 SURGICAL SUPPLY — 72 items
ADH SKN CLS APL DERMABOND .7 (GAUZE/BANDAGES/DRESSINGS) ×2
APL SRG 38 LTWT LNG FL B (MISCELLANEOUS)
APPLICATOR ARISTA FLEXITIP XL (MISCELLANEOUS) IMPLANT
BAG DRN RND TRDRP ANRFLXCHMBR (UROLOGICAL SUPPLIES) ×2
BAG URINE DRAIN 2000ML AR STRL (UROLOGICAL SUPPLIES) ×2 IMPLANT
BARRIER ADHS 3X4 INTERCEED (GAUZE/BANDAGES/DRESSINGS) IMPLANT
BRR ADH 4X3 ABS CNTRL BYND (GAUZE/BANDAGES/DRESSINGS)
CATH FOLEY 3WAY 5CC 16FR (CATHETERS) ×2 IMPLANT
COVER BACK TABLE 60X90IN (DRAPES) ×2 IMPLANT
COVER TIP SHEARS 8 DVNC (MISCELLANEOUS) ×2 IMPLANT
DEFOGGER SCOPE WARMER CLEARIFY (MISCELLANEOUS) ×2 IMPLANT
DERMABOND ADVANCED .7 DNX12 (GAUZE/BANDAGES/DRESSINGS) ×2 IMPLANT
DRAPE ARM DVNC X/XI (DISPOSABLE) ×8 IMPLANT
DRAPE COLUMN DVNC XI (DISPOSABLE) ×2 IMPLANT
DRAPE SURG IRRIG POUCH 19X23 (DRAPES) ×2 IMPLANT
DRAPE UTILITY XL STRL (DRAPES) ×2 IMPLANT
DRIVER NDL MEGA SUTCUT DVNCXI (INSTRUMENTS) ×2 IMPLANT
DRIVER NDLE MEGA SUTCUT DVNCXI (INSTRUMENTS) ×2
DURAPREP 26ML APPLICATOR (WOUND CARE) ×2 IMPLANT
ELECT REM PT RETURN 9FT ADLT (ELECTROSURGICAL) ×2
ELECTRODE REM PT RTRN 9FT ADLT (ELECTROSURGICAL) ×2 IMPLANT
FORCEPS PROGRASP DVNC XI (FORCEP) ×2 IMPLANT
GAUZE 4X4 16PLY ~~LOC~~+RFID DBL (SPONGE) IMPLANT
GLOVE BIO SURGEON STRL SZ7 (GLOVE) ×6 IMPLANT
GLOVE BIOGEL PI IND STRL 7.0 (GLOVE) ×6 IMPLANT
GOWN STRL REUS W/ TWL XL LVL3 (GOWN DISPOSABLE) ×6 IMPLANT
GOWN STRL REUS W/TWL XL LVL3 (GOWN DISPOSABLE) ×6
GYRUS RUMI II 2.5CM BLUE (DISPOSABLE)
GYRUS RUMI II 3.5CM BLUE (DISPOSABLE)
GYRUS RUMI II 4.0CM BLUE (DISPOSABLE)
HEMOSTAT ARISTA ABSORB 3G PWDR (HEMOSTASIS) IMPLANT
HOLDER FOLEY CATH W/STRAP (MISCELLANEOUS) IMPLANT
IRRIG SUCT STRYKERFLOW 2 WTIP (MISCELLANEOUS) ×2
IRRIGATION SUCT STRKRFLW 2 WTP (MISCELLANEOUS) ×2 IMPLANT
KIT PINK PAD W/HEAD ARE REST (MISCELLANEOUS) ×2
KIT PINK PAD W/HEAD ARM REST (MISCELLANEOUS) ×2 IMPLANT
KIT TURNOVER CYSTO (KITS) ×2 IMPLANT
LEGGING LITHOTOMY PAIR STRL (DRAPES) ×2 IMPLANT
NDL HYPO 22X1.5 SAFETY MO (MISCELLANEOUS) IMPLANT
NEEDLE HYPO 22X1.5 SAFETY MO (MISCELLANEOUS) ×2
OBTURATOR OPTICAL STND 8 DVNC (TROCAR) ×2
OBTURATOR OPTICALSTD 8 DVNC (TROCAR) ×2 IMPLANT
OCCLUDER COLPOPNEUMO (BALLOONS) IMPLANT
PACK ROBOT WH (CUSTOM PROCEDURE TRAY) ×2 IMPLANT
PAD OB MATERNITY 4.3X12.25 (PERSONAL CARE ITEMS) ×2 IMPLANT
PAD PREP 24X48 CUFFED NSTRL (MISCELLANEOUS) ×2 IMPLANT
PROTECTOR NERVE ULNAR (MISCELLANEOUS) ×2 IMPLANT
RUMI II 3.0CM BLUE KOH-EFFICIE (DISPOSABLE) IMPLANT
RUMI II GYRUS 2.5CM BLUE (DISPOSABLE) IMPLANT
RUMI II GYRUS 3.5CM BLUE (DISPOSABLE) IMPLANT
RUMI II GYRUS 4.0CM BLUE (DISPOSABLE) IMPLANT
SCISSORS MNPLR CVD DVNC XI (INSTRUMENTS) ×2 IMPLANT
SEAL UNIV 5-12 XI (MISCELLANEOUS) ×8 IMPLANT
SEALER VESSEL EXT DVNC XI (MISCELLANEOUS) IMPLANT
SET IRRIG Y TYPE TUR BLADDER L (SET/KITS/TRAYS/PACK) ×2 IMPLANT
SET TRI-LUMEN FLTR TB AIRSEAL (TUBING) ×2 IMPLANT
SLEEVE SCD COMPRESS KNEE MED (STOCKING) ×2 IMPLANT
SPIKE FLUID TRANSFER (MISCELLANEOUS) ×2 IMPLANT
SUT VIC AB 0 CT1 27 (SUTURE)
SUT VIC AB 0 CT1 27XBRD ANBCTR (SUTURE) IMPLANT
SUT VIC AB 4-0 PS2 18 (SUTURE) ×4 IMPLANT
SUT VICRYL 0 UR6 27IN ABS (SUTURE) IMPLANT
SUT VLOC 180 0 9IN GS21 (SUTURE) ×2 IMPLANT
TIP RUMI ORANGE 6.7MMX12CM (TIP) IMPLANT
TIP UTERINE 5.1X6CM LAV DISP (MISCELLANEOUS) IMPLANT
TIP UTERINE 6.7X10CM GRN DISP (MISCELLANEOUS) IMPLANT
TIP UTERINE 6.7X6CM WHT DISP (MISCELLANEOUS) IMPLANT
TIP UTERINE 6.7X8CM BLUE DISP (MISCELLANEOUS) IMPLANT
TOWEL OR 17X24 6PK STRL BLUE (TOWEL DISPOSABLE) ×2 IMPLANT
TROCAR PORT AIRSEAL 8X120 (TROCAR) ×2 IMPLANT
WATER STERILE IRR 1000ML POUR (IV SOLUTION) ×2 IMPLANT
WATER STERILE IRR 500ML POUR (IV SOLUTION) IMPLANT

## 2023-07-05 NOTE — Discharge Instructions (Signed)

## 2023-07-05 NOTE — Op Note (Signed)
[place] Rocker PROCEDURE DATE: 07/05/2023  PREOPERATIVE DIAGNOSIS: abnormal uterine bleeding, dysmenorrhea, pelvic pain   POSTOPERATIVE DIAGNOSIS: abnormal uterine bleeding, dysmenorrhea, pelvic pain PROCEDURE:    robotic-assisted total laparoscopic hysterectomy, bilateral salpingectomy, lysis of adhesions, cystoscopy, and laparoscopic TAP block SURGEON: Lorriane Shire, MD ASSISTANT:  Milas Hock, MD    An experienced assistant was required given the standard of surgical care given the complexity of the case.  This assistant was needed for exposure, dissection, suctioning, retraction, instrument exchange, and for overall help during the procedure.  INDICATIONS: 42 y.o. [id number] with AUB, dysmenorrhea and pelvic pain.  Risks of surgery were discussed with the patient including but not limited to: bleeding which may require transfusion; infection which may require antibiotics; injury to surrounding organs; need for additional procedures including laparotomy;  and other postoperative/anesthesia complications. Written informed consent was obtained.    FINDINGS:  Normal external genitalia, 8 wk size mobile/immobile uterus with Normal contours.  Laparoscopically: normal upper abdominal survey, omental adhesion to the anterior abdominal wall inferior to the umbilicus, normal sized uterus with dense adhesions to the bladder, surgically ligated bilateral fallopian tubes, normal bilateral ovaries, bilateral ureters seen, normal posterior cul de sac Cystoscopically: normal bladder wall without apparent injury, bilateral ureteral orifices, fluorescein-stained urine from bilateral ureteral orifices   ANESTHESIA: General, paracervial block, laparoscopic TAP block INTRAVENOUS FLUIDS:  900 ml of LR ESTIMATED BLOOD LOSS:  15 ml URINE OUTPUT: 100 ml SPECIMENS: uterus, cervix, bilateral fallopian tubes COMPLICATIONS:  None immediate.   The risks, benefits, and alternatives of surgery were explained,  understood, and accepted. Consents were signed. All questions were answered. She was taken to the operating room and general anesthesia was applied without complication. She was placed in the dorsal lithotomy position and her abdomen and vagina were prepped and draped after she had been carefully positioned on the table. A bimanual exam revealed a 8 week size uterus. Her adnexa were not enlarged. A Foley catheter was placed and it drained clear throughout the case. A speculum was placed and the cervix visualized and grasped with a single tooth tenaculum. The cervix was measured and the uterus was sounded to 8 cm. A Rumi uterine manipulator was placed without difficulty and the tenaculum removed.  Gloves were changed and attention was turned to the abdomen. An 8mm incision was made in the LUQ and an optiview airseal trocar was introduced into the abdomen. The Entry was confirmed with low opening intraabdominal pressure and visualization and the abdomen was then insufflated. After good pneumoperitoneum was established, the abdomen was surveyed including the upper abdomen. Laparoscopic TAP block was then completed bilaterally with 20cc of exparel. She was placed in Trendelenburg position and ports were placed in appropriate positions on her abdomen to allow maximum exposure during the robotic case. Specifically, trocars were placed umbilically, in the LLQ, RLQ, and RUQ.  These were all placed under direct laparoscopic visualization after infiltration with local anesthetic. The robot was docked and I proceeded with a robotic portion of the case.  The pelvis was inspected and the uterus was found to have dense anterior adhesions and the omental adhesion. The omental adhesion was taken down with sharp and blunt dissection.  Attention was turned to the left fallopian tube and the mesosalpinx was serially cauterized and cut and the salpingectomy completed. The uteroovarian ligament was divided. The round ligament that was  encased within the anterior adhesion was identified and divided. The leaves of the broad ligament identified. The posterior flap was divided. The  anterior leaf was initially divided but division stopped when the dense adhesion was encountered. Attention turned to the right side where the same was carried out. The posterior leaf was divided down to the level of the cup. The anterior leaf was divided until until the most dense portion of the adhesion was reached. The bladder was backfilled to allow for better delineation of the bladder from the uterus. The bladder was carefully dissected from the anterior uterus using blunt and sharp dissection. Eventually the pubocervical fascia was reached and the bladder flap mobilized inferiorly. The uterine vessels were skeletonized, cauterized, divided and lateralized to the cup edge. The bladder was pushed out of the operative site and an anterior colpotomy was made. The colpotomy incision was extended circumferentially, following the blue outline of the Rumi manipulator. All pedicles were hemostatic.  The uterus was removed from the vagina. The vaginal cuff was closed with v-lock suture in a running fashion.  Excellent hemostasis was noted throughout. The pelvis was irrigated. The intraabdominal pressure was lowered assess hemostasis. After determining excellent hemostasis, the robot was undocked. At this point I performed cystoscopy. The cystoscopy revealed fluoroscein ejection from both ureters.  The skin from all of the other ports was closed with 4-0 vicryl.   The patient was then extubated and taken to recovery in stable condition.   Sponge, lap and needle counts were correct x 2.  Lorriane Shire, MD Minimally Invasive Gynecologic Surgery and Chronic Pelvic Pain Specialist Obstetrics and Gynecology, [facility] for [facility], [facility] 07/05/2023

## 2023-07-05 NOTE — Transfer of Care (Signed)
Immediate Anesthesia Transfer of Care Note  Patient: Allison Galloway  Procedure(s) Performed: XI ROBOTIC ASSISTED TOTAL LAPRASCOPIC HYSTERECTOMY WITH BILATERAL SALPINGECTOMY,LYSIS OF ACHESIONS, TAP BLOCK (Bilateral: Abdomen) CYSTOSCOPY (Bladder)  Patient Location: PACU  Anesthesia Type:General  Level of Consciousness: drowsy and patient cooperative  Airway & Oxygen Therapy: Patient Spontanous Breathing and Patient connected to face mask oxygen  Post-op Assessment: Report given to RN and Post -op Vital signs reviewed and stable  Post vital signs: Reviewed and stable  Last Vitals:  Vitals Value Taken Time  BP 139/89 07/05/23 1301  Temp 36.2 C 07/05/23 1300  Pulse 82 07/05/23 1311  Resp 32 07/05/23 1311  SpO2 96 % 07/05/23 1311  Vitals shown include unfiled device data.  Last Pain:  Vitals:   07/05/23 0933  TempSrc: Oral  PainSc: 0-No pain      Patients Stated Pain Goal: 4 (07/05/23 0933)  Complications: No notable events documented.

## 2023-07-05 NOTE — Anesthesia Postprocedure Evaluation (Signed)
Anesthesia Post Note  Patient: Allison Galloway  Procedure(s) Performed: XI ROBOTIC ASSISTED TOTAL LAPRASCOPIC HYSTERECTOMY WITH BILATERAL SALPINGECTOMY,LYSIS OF ACHESIONS, TAP BLOCK (Bilateral: Abdomen) CYSTOSCOPY (Bladder)     Patient location during evaluation: PACU Anesthesia Type: General Level of consciousness: awake and alert Pain management: pain level controlled Vital Signs Assessment: post-procedure vital signs reviewed and stable Respiratory status: spontaneous breathing, nonlabored ventilation, respiratory function stable and patient connected to nasal cannula oxygen Cardiovascular status: blood pressure returned to baseline and stable Postop Assessment: no apparent nausea or vomiting Anesthetic complications: no   No notable events documented.  Last Vitals:  Vitals:   07/05/23 1345 07/05/23 1400  BP: (!) 138/91 (!) 140/88  Pulse: 72 76  Resp: 16 15  Temp:    SpO2: 93% 92%    Last Pain:  Vitals:   07/05/23 1400  TempSrc:   PainSc: 0-No pain                 Collene Schlichter

## 2023-07-05 NOTE — H&P (Signed)
OB/GYN Pre-Op History and Physical  Allison Galloway is a 42 y.o. [phone] presenting for AUB and dysmenorrhea.       Past Medical History:  Diagnosis Date   Abnormal vaginal bleeding 2024   Anxiety    Follows w/ PCP   Arthritis    left knee   Chronic low back pain with bilateral sciatica    Constipation    Follows with [facility] for chronic constipation.   Conversion disorder    speech difficulty, tremors, weakness, negative cardiac, pulmonary & neuro imaging workup. Patient states all symptoms were thought to be related to conversion disorder.   Depression    Follows w/ PCP.   Facial weakness 2023   left-sided facial weakiness, speech difficulty w/ stuttering, left-sided weakness / Saw neurologist,Dr. Darvin Neighbours on 03/15/22 in Epic.   Headache    hx of migraines   SOB (shortness of breath)    Former smoker. Follows w/ Dr. Francine Graven pulmonology and Dr. Edwyna Perfect, cardiology. 03/03/22 Echo within normal limits, EF 65 -70%. Patient states SOB r/t panic attacks.   Type 2 diabetes mellitus (HCC)    Follows w/ PCP, Gilford Silvius, FNP.   Wears glasses     Past Surgical History:  Procedure Laterality Date   CESAREAN SECTION     3   KNEE SURGERY Left    x 3   TUBAL LIGATION  2006    OB History  Gravida Para Term Preterm AB Living  3 3 2 1   3   SAB IAB Ectopic Multiple Live Births          3    # Outcome Date GA Lbr Len/2nd Weight Sex Type Anes PTL Lv  3 Term 2006 [redacted]w[redacted]d   F CS-LTranv Spinal N LIV  2 Term 2003 [redacted]w[redacted]d   F Vag-Spont Spinal N LIV  1 Preterm 2000 [redacted]w[redacted]d   F CS-LTranv EPI N LIV    Social History   Socioeconomic History   Marital status: Married    Spouse name: Not on file   Number of children: Not on file   Years of education: Not on file   Highest education level: Not on file  Occupational History   Not on file  Tobacco Use   Smoking status: Former    Current packs/day: 0.00    Types: Cigarettes    Quit date: 08/2021    Years since quitting: 1.8    Smokeless tobacco: Never  Vaping Use   Vaping status: Some Days   Substances: Nicotine, Flavoring  Substance and Sexual Activity   Alcohol use: No   Drug use: Not Currently    Types: Marijuana    Comment: last used 2023   Sexual activity: Yes    Partners: Male    Birth control/protection: Surgical    Comment: tubal, menarche 42yo, sexual debut 42yo  Other Topics Concern   Not on file  Social History Narrative   Not on file   Social Determinants of Health   Financial Resource Strain: Not on file  Food Insecurity: Not on file  Transportation Needs: Not on file  Physical Activity: Not on file  Stress: Not on file  Social Connections: Not on file    Family History  Problem Relation Age of Onset   Diabetes Mother    Depression Mother    Arthritis Mother    Asthma Mother    Learning disabilities Mother    Mental illness Mother    Depression Sister    Asthma  Sister    Mental illness Sister    Seizures Daughter    Hypertension Other    Bipolar disorder Other    Anxiety disorder Other    Depression Other     Medications Prior to Admission  Medication Sig Dispense Refill Last Dose   blood glucose meter kit and supplies Dispense based on patient and insurance preference. Use up to four times daily as directed. (FOR ICD-10 E10.9, E11.9). 1 each 0 07/04/2023   busPIRone (BUSPAR) 5 MG tablet Take 5 mg by mouth 3 (three) times daily as needed (for anxiety).   07/04/2023   cyclobenzaprine (FLEXERIL) 10 MG tablet Take 1 tablet (10 mg total) by mouth 2 (two) times daily as needed for muscle spasms. 30 tablet 1 Past Week   enalapril (VASOTEC) 2.5 MG tablet Take 1 tablet (2.5 mg total) by mouth daily. 90 tablet 3 07/04/2023   metFORMIN (GLUCOPHAGE) 850 MG tablet Take 1 tablet (850 mg total) by mouth 2 (two) times daily with a meal. 180 tablet 2 07/04/2023   norethindrone (AYGESTIN) 5 MG tablet Take 2 tablets (10 mg total) by mouth daily. 60 tablet 2 07/04/2023   Peppermint Oil (IBGARD) 90 MG  CPCR Take as directed. (Patient taking differently: as needed. Take as directed.) 16 capsule 0 Past Month   ibuprofen (ADVIL) 200 MG tablet Take 200-400 mg by mouth every 6 (six) hours as needed for mild pain, headache or cramping.   More than a month   linaclotide (LINZESS) 145 MCG CAPS capsule Take 1 capsule (145 mcg total) by mouth daily before breakfast. 90 capsule 3 07/03/2023   TYLENOL 500 MG tablet Take 500-1,000 mg by mouth every 6 (six) hours as needed for mild pain or headache.   07/03/2023    No Known Allergies  Review of Systems: Negative except for what is mentioned in HPI.     Physical Exam: BP (!) 143/96   Pulse 100   Temp 97.8 F (36.6 C) (Oral)   Resp 17   Ht 5\' 2"  (1.575 m)   Wt 77.8 kg   LMP 10/02/2022 (Approximate) Comment: Patient has had irregular off and on bleeding in 2024.  SpO2 97%   BMI 31.39 kg/m  CONSTITUTIONAL: Well-developed, well-nourished female in no acute distress.  HENT:  Normocephalic, atraumatic, External right and left ear normal. Oropharynx is clear and moist EYES: Conjunctivae and EOM are normal. Pupils are equal, round, and reactive to light. No scleral icterus.  NECK: Normal range of motion, supple, no masses SKIN: Skin is warm and dry. No rash noted. Not diaphoretic. No erythema. No pallor. NEUROLGIC: Alert and oriented to person, place, and time. Normal reflexes, muscle tone coordination. No cranial nerve deficit noted. PSYCHIATRIC: Normal mood and affect. Normal behavior. Normal judgment and thought content. RESPIRATORY: Normal Effort and breath sounds normal, no problems with respiration noted PELVIC: Deferred MUSCULOSKELETAL: Normal range of motion. No edema and no tenderness. 2+ distal pulses.   Pertinent Labs/Studies:   Results for orders placed or performed during the hospital encounter of 07/05/23 (from the past 72 hour(s))  Pregnancy, urine POC     Status: None   Collection Time: 07/05/23  9:19 AM  Result Value Ref Range   Preg  Test, Ur NEGATIVE NEGATIVE    Comment:        THE SENSITIVITY OF THIS METHODOLOGY IS >24 mIU/mL        Assessment and Plan :Allison Galloway is a 42 y.o. [phone] here for RA-TLH, BS, cysto.  Previously reviewed risks, benefits and alternatives. No changes in medial history. Will proceed as scheduled.    Lorriane Shire, M.D. Minimally Invasive Gynecologic Surgery and Pelvic Pain Specialist Attending Obstetrician & Gynecologist, [facility] [employer], [facility]

## 2023-07-05 NOTE — Anesthesia Preprocedure Evaluation (Signed)
Anesthesia Evaluation  Patient identified by MRN, date of birth, ID band Patient awake    Reviewed: Allergy & Precautions, NPO status , Patient's Chart, lab work & pertinent test results  Airway Mallampati: II  TM Distance: >3 FB Neck ROM: Full    Dental  (+) Teeth Intact, Dental Advisory Given   Pulmonary neg pulmonary ROS, Patient abstained from smoking., former smoker   Pulmonary exam normal breath sounds clear to auscultation       Cardiovascular Pt. on medications Normal cardiovascular exam Rhythm:Regular Rate:Normal     Neuro/Psych  Headaches PSYCHIATRIC DISORDERS Anxiety Depression    Conversion disorder   Neuromuscular disease    GI/Hepatic negative GI ROS, Neg liver ROS,,,  Endo/Other  diabetes, Type 2, Oral Hypoglycemic Agents    Renal/GU negative Renal ROS     Musculoskeletal  (+) Arthritis ,    Abdominal   Peds  Hematology  (+) Blood dyscrasia, anemia   Anesthesia Other Findings Day of surgery medications reviewed with the patient.  Reproductive/Obstetrics AUB  Dysmenorrhea                                Anesthesia Physical Anesthesia Plan  ASA: 3  Anesthesia Plan: General   Post-op Pain Management: Tylenol PO (pre-op)* and Toradol IV (intra-op)*   Induction: Intravenous  PONV Risk Score and Plan: 4 or greater and Scopolamine patch - Pre-op, Midazolam, Dexamethasone and Ondansetron  Airway Management Planned: Oral ETT  Additional Equipment:   Intra-op Plan:   Post-operative Plan: Extubation in OR  Informed Consent: I have reviewed the patients History and Physical, chart, labs and discussed the procedure including the risks, benefits and alternatives for the proposed anesthesia with the patient or authorized representative who has indicated his/her understanding and acceptance.     Dental advisory given  Plan Discussed with: CRNA  Anesthesia Plan  Comments: (2nd PIV)         Anesthesia Quick Evaluation

## 2023-07-05 NOTE — Brief Op Note (Signed)
07/05/2023  12:58 PM  PATIENT:  Allison Galloway  42 y.o. female  PRE-OPERATIVE DIAGNOSIS:  AUB  Dysmenorrhea  POST-OPERATIVE DIAGNOSIS:  AUB Dysmenorrhea  PROCEDURE:  Procedure(s): XI ROBOTIC ASSISTED TOTAL LAPRASCOPIC HYSTERECTOMY WITH BILATERAL SALPINGECTOMY,LYSIS OF ACHESIONS, TAP BLOCK (Bilateral) CYSTOSCOPY (N/A)  SURGEON:  Surgeons and Role:    Lorriane Shire, MD - Primary    * Milas Hock, MD - Assisting  PHYSICIAN ASSISTANT: n/a  ASSISTANTS: none   ANESTHESIA:   general, paracervical block, and laparoscopic TAP block  EBL:  50 mL   BLOOD ADMINISTERED:none  DRAINS: none   LOCAL MEDICATIONS USED:  BUPIVICAINE  and OTHER exparel  SPECIMEN:  Source of Specimen:  uterus, cervix, bilateral fallopian tubes  DISPOSITION OF SPECIMEN:  PATHOLOGY  COUNTS:  YES  TOURNIQUET:  * No tourniquets in log *  DICTATION: .Note written in EPIC  PLAN OF CARE:  extended recovery  PATIENT DISPOSITION:  PACU - hemodynamically stable.   Delay start of Pharmacological VTE agent (>24hrs) due to surgical blood loss or risk of bleeding: not applicable

## 2023-07-05 NOTE — Anesthesia Procedure Notes (Signed)
Procedure Name: Intubation Date/Time: 07/05/2023 10:39 AM  Performed by: Dairl Ponder, CRNAPre-anesthesia Checklist: Patient identified, Emergency Drugs available, Suction available and Patient being monitored Patient Re-evaluated:Patient Re-evaluated prior to induction Oxygen Delivery Method: Circle System Utilized Preoxygenation: Pre-oxygenation with 100% oxygen Induction Type: IV induction Ventilation: Mask ventilation without difficulty Laryngoscope Size: Mac and 3 Grade View: Grade I Tube type: Oral Tube size: 7.0 mm Number of attempts: 1 Airway Equipment and Method: Stylet and Oral airway Placement Confirmation: ETT inserted through vocal cords under direct vision, positive ETCO2 and breath sounds checked- equal and bilateral Secured at: 22 cm Tube secured with: Tape Dental Injury: Teeth and Oropharynx as per pre-operative assessment

## 2023-07-06 ENCOUNTER — Other Ambulatory Visit: Payer: Self-pay | Admitting: Obstetrics and Gynecology

## 2023-07-06 ENCOUNTER — Encounter (HOSPITAL_BASED_OUTPATIENT_CLINIC_OR_DEPARTMENT_OTHER): Payer: Self-pay | Admitting: Obstetrics and Gynecology

## 2023-07-06 DIAGNOSIS — N946 Dysmenorrhea, unspecified: Secondary | ICD-10-CM

## 2023-07-20 ENCOUNTER — Ambulatory Visit (INDEPENDENT_AMBULATORY_CARE_PROVIDER_SITE_OTHER): Payer: BC Managed Care – PPO | Admitting: Obstetrics and Gynecology

## 2023-07-20 VITALS — BP 134/84 | HR 118 | Ht 62.0 in | Wt 171.0 lb

## 2023-07-20 DIAGNOSIS — Z09 Encounter for follow-up examination after completed treatment for conditions other than malignant neoplasm: Secondary | ICD-10-CM

## 2023-07-20 DIAGNOSIS — N939 Abnormal uterine and vaginal bleeding, unspecified: Secondary | ICD-10-CM

## 2023-07-20 DIAGNOSIS — N946 Dysmenorrhea, unspecified: Secondary | ICD-10-CM

## 2023-07-20 DIAGNOSIS — K5900 Constipation, unspecified: Secondary | ICD-10-CM

## 2023-07-20 NOTE — Progress Notes (Unsigned)
Patient ID: Allison Galloway, female   DOB: 08/07/1981, 42 y.o.   MRN: [id number]    POSTOPERATIVE VISIT NOTE   Subjective:     Allison Galloway is a 42 y.o. [phone] who presents to the clinic 2 weeks status post  RA-TLH, BS, cysto  for {gyn surg indications maj:13998}. Eating a regular diet {with-without:5700} difficulty. Bowel movements are {normal/abnormal***:19619}. {pain control:13522::"The patient is not having any pain."} Incision: *** Vaginal bleeding: *** Resumed sexual acitivity:   {Common ambulatory SmartLinks:19316}.   Review of Systems {ros; complete:30496}    Objective:    BP 134/84   Pulse (!) 118   Ht 5\' 2"  (1.575 m)   Wt 171 lb (77.6 kg)   LMP 10/02/2022 (Approximate) Comment: Patient has had irregular off and on bleeding in 2024.  BMI 31.28 kg/m  General:  {gen appearance:16600}  Abdomen: {post op abd exam:13497::"soft","bowel sounds active","non-tender"}  Incision:   {incision:13716::"healing well","no drainage","no dehiscence","no erythema","no hernia","no seroma","incision well approximated","no swelling"}  Pelvic:   {Exam; pelvic:30843}    Pathology Results: ***   Assessment:   {doing well:13525::"Doing well postoperatively."} Operative findings again reviewed. Pathology report discussed.   There are no diagnoses linked to this encounter.   Plan:   1. Continue any current medications. 2. *** 3. Activity restrictions: {restrictions:13723} 4. Anticipated return to work: {work return:14002}. 5. Follow up as needed   Lorriane Shire, MD Obstetrician & Gynecologist, [facility] for [employer], [facility]

## 2023-07-20 NOTE — Progress Notes (Unsigned)
2 week post op Robotic assisted total lap. Hysterectomy with bilater salpingectomy. Armandina Stammer RN

## 2023-07-25 NOTE — Progress Notes (Unsigned)
07/27/2023 Allison Galloway [id number] 1981/08/03  Referring provider: [name], FNP Primary GI doctor: Dr. Chales Abrahams ( Dr. Marletta Lor transferred)  ASSESSMENT AND PLAN:   Iron deficiency anemia, unspecified iron deficiency anemia type Patient now status post hysterectomy 8/6 for dysmenorrhea Hgb prior to hysterectomy was 8.6, status post 1 unit has not had a recheck of her hemoglobin.  She has fatigue and worsening restless legs.  Suspect she has iron deficiency with probably still low hemoglobin.  She is unable to tolerate oral iron due to worsening constipation. Will recheck CBC, iron and consider iron infusions Will also schedule 36-month follow-up, if patient continues to have anemia/iron deficiency despite hysterectomy or any continuing abdominal discomfort we will plan for endoscopic evaluation at that time.  Chronic constipation Likely still component of CIC/IBS and pelvic floor worsened by short course of opioids after hysterectomy. Will increase Linzess to 290 samples, if she does well with this we will call it in. Continue pelvic floor exercises at home when she is able to Patient is due for colonoscopy age 31, can consider sooner if any worsening symptoms or no improvement.  Conversion disorder Previous unremarkable workup with multiple specialties for multitude of symptoms in the past.  Burping Denies GERD, dysphagia, weight loss.  Potentially worsening from opioids/surgery.  Will do 1 month of Pepcid and given information about decreasing air swallowing/foods/Gas-X.   Patient Care Team: [name], FNP as PCP - General (Family Medicine) Christell Constant, MD as PCP - Cardiology (Cardiology) Tanda Rockers, NP as Nurse Practitioner (Radiology) Michaelle Copas, MD as Referring Physician (Optometry)  HISTORY OF PRESENT ILLNESS: 42 y.o. female with a past medical history of chronic constiapation, anxiety/depression, and others listed below presents for  evaluation of IBS-C.   Notes reviewed from [facility] and someone else in [place] but she did not mesh with them: Patient's had chronic constipation trial Amitiza 24 mcg twice daily with some benefit. Patient has left facial weakness/twitching and left leg weakness, stuttering of speech.  Has had unremarkable workup with pulmonary, cardiology, neurosurgery and neurology.  Incidental small 5 mm in right mid frontal meningioma but otherwise very thorough workup without known cause.  Ultimately felt she has conversion disorder due to anxiety stress. Patient is no longer able to work or drive. 04/07/2022 office visit with PA Lewis at [facility], given Tr[facility] 10/29/2022 labs reviewed show no leukocytosis, no anemia normal kidney liver, did show diabetes. 10/20/22 positive cannabinoid drug screen  02/11/2023 patient seen in the office with myself for IBS constipation. That visit patient was having abdominal discomfort, bloating no alarm symptoms given trial of Linzess and discussed following up with GYN for possible pelvic floor component. 05/06/2023 CT abdomen pelvis with contrast for vaginal bleeding and pelvic pain showed prominent hypodensity along endometrium cervix and extending vagina and total density most likely blood products, substantial thickening lower uterine segment endometrial contents containing long very thin uterus C-section scar no rupture of the scar adjacent bladder walls immediately abutting the anterior uterus urinary wall thickening of urinary bladder nondistention or cystitis, sclerosis and sacroiliac joints pubis sacroiliitis potential with mild bursitis pubis 07/05/2023 patient had robotic assisted total laparoscopic hysterectomy bilateral salpingectomy, lysis of adhesions, cystoscopy and laparoscopic TEP block for dysmenorrhea and pelvic pain  She went to pelvic PT twice but had copay she could not afford.  She states she is sore from her hysterectomy. She is  still on the linzess, prior to the surgery was  every 2 days, since the surgery 2-3 days without a BM and then have a BM. Has been softer/not has hard. She was on oxycodone right after the surgery but has stopped.  No blood in the stool.  She has back pain but worse with movement, better with sitting. She does have RLS at night, takes flexeril at night.  Her HGB prior to hysterectomy was 8.5, she has not had a recheck, she had 1 PRBC on day of surgery.  She is not on iron, was unable to take oral iron in the past.  She has "sore burps", denies GERD dysphagia.   She  reports that she quit smoking about 22 months ago. Her smoking use included cigarettes. She has never used smokeless tobacco. She reports that she does not currently use drugs after having used the following drugs: Marijuana. She reports that she does not drink alcohol.  RELEVANT LABS AND IMAGING: CBC    Component Value Date/Time   WBC 11.1 (H) 07/01/2023 0847   RBC 4.29 07/01/2023 0847   HGB 8.5 (L) 07/01/2023 0847   HGB 13.3 10/29/2022 1215   HCT 29.9 (L) 07/01/2023 0847   HCT 40.0 10/29/2022 1215   PLT 397 07/01/2023 0847   PLT 245 10/29/2022 1215   MCV 69.7 (L) 07/01/2023 0847   MCV 82 10/29/2022 1215   MCH 19.8 (L) 07/01/2023 0847   MCHC 28.4 (L) 07/01/2023 0847   RDW 16.9 (H) 07/01/2023 0847   RDW 14.1 10/29/2022 1215   LYMPHSABS 3.9 05/06/2023 1435   LYMPHSABS 1.9 10/29/2022 1215   MONOABS 0.9 05/06/2023 1435   EOSABS 0.3 05/06/2023 1435   EOSABS 0.1 10/29/2022 1215   BASOSABS 0.0 05/06/2023 1435   BASOSABS 0.0 10/29/2022 1215   Recent Labs    10/29/22 1215 04/07/23 1122 04/07/23 1250 04/13/23 1252 04/20/23 1631 05/06/23 1435 05/06/23 1452 07/01/23 0847  HGB 13.3 9.5* 9.3* 10.2* 10.4* 10.4* 11.2* 8.5*     CMP     Component Value Date/Time   NA 136 07/01/2023 0847   NA 141 02/11/2023 0846   K 4.5 07/01/2023 0847   CL 106 07/01/2023 0847   CO2 21 (L) 07/01/2023 0847   GLUCOSE 163 (H) 07/01/2023  0847   BUN 11 07/01/2023 0847   BUN 10 02/11/2023 0846   CREATININE 0.86 07/01/2023 0847   CALCIUM 9.3 07/01/2023 0847   PROT 8.3 (H) 05/06/2023 1435   PROT 7.0 02/11/2023 0846   ALBUMIN 4.1 05/06/2023 1435   ALBUMIN 4.0 02/11/2023 0846   AST 19 05/06/2023 1435   ALT 19 05/06/2023 1435   ALKPHOS 56 05/06/2023 1435   BILITOT 0.3 05/06/2023 1435   BILITOT <0.2 02/11/2023 0846   GFRNONAA >60 07/01/2023 0847   GFRAA >60 07/11/2020 1926      Latest Ref Rng & Units 05/06/2023    2:35 PM 04/20/2023    4:31 PM 04/07/2023   12:50 PM  Hepatic Function  Total Protein 6.5 - 8.1 g/dL 8.3  8.2  6.7   Albumin 3.5 - 5.0 g/dL 4.1  4.3  3.2   AST 15 - 41 U/L 19  16  22    ALT 0 - 44 U/L 19  18  22    Alk Phosphatase 38 - 126 U/L 56  52  59   Total Bilirubin 0.3 - 1.2 mg/dL 0.3  0.8  0.4       Current Medications:   Current Outpatient Medications (Endocrine & Metabolic):    metFORMIN (GLUCOPHAGE) 850 MG  tablet, Take 1 tablet (850 mg total) by mouth 2 (two) times daily with a meal.   norethindrone (AYGESTIN) 5 MG tablet, Take 2 tablets (10 mg total) by mouth daily. (Patient not taking: Reported on 07/27/2023)  Current Outpatient Medications (Cardiovascular):    enalapril (VASOTEC) 2.5 MG tablet, Take 1 tablet (2.5 mg total) by mouth daily.   Current Outpatient Medications (Analgesics):    Acetaminophen Extra Strength 500 MG TABS, Take 2 tablets (1,000 mg total) by mouth every 6 (six) hours as needed.   ibuprofen (ADVIL) 800 MG tablet, Take 1 tablet (800 mg total) by mouth 3 (three) times daily with meals as needed for headache, moderate pain or cramping.   oxyCODONE (OXY IR/ROXICODONE) 5 MG immediate release tablet, Take 1-2 tablets (5-10 mg total) by mouth every 4 (four) hours as needed for moderate pain.   Current Outpatient Medications (Other):    blood glucose meter kit and supplies, Dispense based on patient and insurance preference. Use up to four times daily as directed. (FOR ICD-10  E10.9, E11.9).   busPIRone (BUSPAR) 5 MG tablet, Take 5 mg by mouth 3 (three) times daily as needed (for anxiety).   cyclobenzaprine (FLEXERIL) 10 MG tablet, TAKE 1 TABLET BY MOUTH TWICE DAILY AS NEEDED FOR MUSCLE SPASMS   famotidine (PEPCID) 40 MG tablet, Take 1 tablet (40 mg total) by mouth at bedtime.   linaclotide (LINZESS) 145 MCG CAPS capsule, Take 1 capsule (145 mcg total) by mouth daily before breakfast.   linaclotide (LINZESS) 290 MCG CAPS capsule, Take 1 capsule (290 mcg total) by mouth daily before breakfast.   Peppermint Oil (IBGARD) 90 MG CPCR, Take as directed. (Patient not taking: Reported on 07/27/2023)   polyethylene glycol powder (MIRALAX) 17 GM/SCOOP powder, Take 17 g by mouth daily as needed for mild constipation. (Patient not taking: Reported on 07/27/2023)  Medical History:  Past Medical History:  Diagnosis Date   Abnormal vaginal bleeding 2024   Anxiety    Follows w/ PCP   Arthritis    left knee   Chronic low back pain with bilateral sciatica    Constipation    Follows with [facility] for chronic constipation.   Conversion disorder    speech difficulty, tremors, weakness, negative cardiac, pulmonary & neuro imaging workup. Patient states all symptoms were thought to be related to conversion disorder.   Depression    Follows w/ PCP.   Facial weakness 2023   left-sided facial weakiness, speech difficulty w/ stuttering, left-sided weakness / Saw neurologist,Dr. Darvin Neighbours on 03/15/22 in Epic.   Headache    hx of migraines   SOB (shortness of breath)    Former smoker. Follows w/ Dr. Francine Graven pulmonology and Dr. Edwyna Perfect, cardiology. 03/03/22 Echo within normal limits, EF 65 -70%. Patient states SOB r/t panic attacks.   Type 2 diabetes mellitus (HCC)    Follows w/ PCP, Gilford Silvius, FNP.   Wears glasses    Allergies: No Known Allergies   Surgical History:  She  has a past surgical history that includes Cesarean section; Tubal ligation (2006); Knee surgery (Left);  Robotic assisted total hysterectomy with bilateral salpingo oophorectomy (Bilateral, 07/05/2023); and Cystoscopy (N/A, 07/05/2023). Family History:  Her family history includes Anxiety disorder in an other family member; Arthritis in her mother; Asthma in her mother and sister; Bipolar disorder in an other family member; Depression in her mother, sister, and another family member; Diabetes in her mother; Hypertension in an other family member; Learning disabilities in her mother; Mental  illness in her mother and sister; Seizures in her daughter.  REVIEW OF SYSTEMS  : All other systems reviewed and negative except where noted in the History of Present Illness.  PHYSICAL EXAM: BP 128/88 (BP Location: Left Arm, Patient Position: Sitting, Cuff Size: Normal)   Pulse (!) 101   Ht 5\' 2"  (1.575 m)   Wt 170 lb 6 oz (77.3 kg)   LMP 10/02/2022 (Approximate) Comment: Patient has had irregular off and on bleeding in 2024.  SpO2 97%   BMI 31.16 kg/m  General Appearance: Well nourished, in no apparent distress. Head:   Normocephalic and atraumatic. Eyes:  sclerae anicteric,conjunctive pink  Respiratory: Respiratory effort normal, BS equal bilaterally without rales, rhonchi, wheezing. Cardio: RRR with no MRGs. Peripheral pulses intact.  Abdomen: Soft,  Obese , hypoactive bowel sounds. mild tenderness in the entire abdomen with well-healed laparoscopic surgical scars.  Without guarding and Without rebound. No masses. Rectal: Not evaluated Musculoskeletal: Full ROM, Normal gait. Without edema. Skin:  Dry and intact without significant lesions or rashes Neuro: Alert and  oriented x4;  Has some stuttering but no focal deficits Psych:  Cooperative. Normal mood and affect.    Doree Albee, PA-C 10:27 AM

## 2023-07-27 ENCOUNTER — Other Ambulatory Visit (INDEPENDENT_AMBULATORY_CARE_PROVIDER_SITE_OTHER): Payer: BC Managed Care – PPO

## 2023-07-27 ENCOUNTER — Encounter: Payer: Self-pay | Admitting: Physician Assistant

## 2023-07-27 ENCOUNTER — Ambulatory Visit (INDEPENDENT_AMBULATORY_CARE_PROVIDER_SITE_OTHER): Payer: BC Managed Care – PPO | Admitting: Physician Assistant

## 2023-07-27 VITALS — BP 128/88 | HR 101 | Ht 62.0 in | Wt 170.4 lb

## 2023-07-27 DIAGNOSIS — F449 Dissociative and conversion disorder, unspecified: Secondary | ICD-10-CM

## 2023-07-27 DIAGNOSIS — D509 Iron deficiency anemia, unspecified: Secondary | ICD-10-CM

## 2023-07-27 DIAGNOSIS — K5909 Other constipation: Secondary | ICD-10-CM

## 2023-07-27 DIAGNOSIS — R142 Eructation: Secondary | ICD-10-CM

## 2023-07-27 LAB — CBC WITH DIFFERENTIAL/PLATELET
Basophils Absolute: 0 10*3/uL (ref 0.0–0.1)
Basophils Relative: 0.3 % (ref 0.0–3.0)
Eosinophils Absolute: 0.4 10*3/uL (ref 0.0–0.7)
Eosinophils Relative: 3.9 % (ref 0.0–5.0)
HCT: 29.9 % — ABNORMAL LOW (ref 36.0–46.0)
Hemoglobin: 9 g/dL — ABNORMAL LOW (ref 12.0–15.0)
Lymphocytes Relative: 22.6 % (ref 12.0–46.0)
Lymphs Abs: 2.4 10*3/uL (ref 0.7–4.0)
MCHC: 30.3 g/dL (ref 30.0–36.0)
MCV: 64.4 fl — ABNORMAL LOW (ref 78.0–100.0)
Monocytes Absolute: 0.8 10*3/uL (ref 0.1–1.0)
Monocytes Relative: 7.4 % (ref 3.0–12.0)
Neutro Abs: 7.1 10*3/uL (ref 1.4–7.7)
Neutrophils Relative %: 65.8 % (ref 43.0–77.0)
Platelets: 442 10*3/uL — ABNORMAL HIGH (ref 150.0–400.0)
RBC: 4.64 Mil/uL (ref 3.87–5.11)
RDW: 18.8 % — ABNORMAL HIGH (ref 11.5–15.5)
WBC: 10.8 10*3/uL — ABNORMAL HIGH (ref 4.0–10.5)

## 2023-07-27 LAB — COMPREHENSIVE METABOLIC PANEL
ALT: 13 U/L (ref 0–35)
AST: 12 U/L (ref 0–37)
Albumin: 3.9 g/dL (ref 3.5–5.2)
Alkaline Phosphatase: 76 U/L (ref 39–117)
BUN: 12 mg/dL (ref 6–23)
CO2: 24 mEq/L (ref 19–32)
Calcium: 9.3 mg/dL (ref 8.4–10.5)
Chloride: 105 mEq/L (ref 96–112)
Creatinine, Ser: 0.91 mg/dL (ref 0.40–1.20)
GFR: 77.64 mL/min (ref >60.00)
Glucose, Bld: 139 mg/dL — ABNORMAL HIGH (ref 70–99)
Potassium: 4.2 mEq/L (ref 3.5–5.1)
Sodium: 137 mEq/L (ref 135–145)
Total Bilirubin: 0.3 mg/dL (ref 0.2–1.2)
Total Protein: 7.3 g/dL (ref 6.0–8.3)

## 2023-07-27 LAB — IBC + FERRITIN
Ferritin: 4.4 ng/mL — ABNORMAL LOW (ref 10.0–291.0)
Iron: 19 ug/dL — ABNORMAL LOW (ref 42–145)
Saturation Ratios: 3.8 % — ABNORMAL LOW (ref 20.0–50.0)
TIBC: 504 ug/dL — ABNORMAL HIGH (ref 250.0–450.0)
Transferrin: 360 mg/dL (ref 212.0–360.0)

## 2023-07-27 MED ORDER — FAMOTIDINE 40 MG PO TABS: 40.0000 mg | ORAL_TABLET | Freq: Every day | ORAL | 0 refills | Status: DC

## 2023-07-27 MED ORDER — LINACLOTIDE 290 MCG PO CAPS
290.0000 ug | ORAL_CAPSULE | Freq: Every day | ORAL | Status: DC
Start: 2023-07-27 — End: 2023-10-24

## 2023-07-27 NOTE — Patient Instructions (Addendum)
Your provider has requested that you go to the basement level for lab work before leaving today. Press "B" on the elevator. The lab is located at the first door on the left as you exit the elevator  We have scheduled you a follow up on 10/24/2023 at 10:00am.  Remember belching is caused by excessive air swallowing.   Can do trial of PEPCID once at night for at least 4 weeks.  Please stop: Eating or drinking too fast  Poorly fitting dentures; not chewing food completely  Carbonated beverages  Chewing gum or sucking on hard candies  Excessive swallowing due to nervous tension or postnasal drip  Forced belching to relieve abdominal discomfort To prevent excessive belching, avoid:  Carbonated beverages  Chewing gum  Hard candies   Simethicone/GasX may be helpful  Can try Florastor probiotic twice a day  Linzess will give samples of the *IBS-C patients may begin to experience relief from belly pain and overall abdominal symptoms (pain, discomfort, and bloating) in about 1 week,  with symptoms typically improving over 12 weeks.  Take at least 30 minutes before the first meal of the day on an empty stomach You can have a loose stool if you eat a high-fat breakfast. Give it at least 7 days, may have more bowel movements during that time.   The diarrhea should go away and you should start having normal, complete, full bowel movements.  It may be helpful to start treatment when you can be near the comfort of your own bathroom, such as a weekend.  After you are out we can send in a prescription if you did well, there is a prescription card  FIBER SUPPLEMENT You can do metamucil or fibercon once or twice a day but if this causes gas/bloating please switch to Benefiber or Citracel.  Fiber is good for constipation/diarrhea/irritable bowel syndrome.  It can also help with weight loss and can help lower your bad cholesterol (LDL).  Please do 1 TBSP in the morning in water, coffee, or tea.   It can take up to a month before you can see a difference with your bowel movements.  It is cheapest from [employer], [employer], [employer].   _______________________________________________________  If your blood pressure at your visit was 140/90 or greater, please contact your primary care physician to follow up on this.  _______________________________________________________  If you are age 43 or older, your body mass index should be between 23-30. Your Body mass index is 31.16 kg/m. If this is out of the aforementioned range listed, please consider follow up with your Primary Care Provider.  If you are age 14 or younger, your body mass index should be between 19-25. Your Body mass index is 31.16 kg/m. If this is out of the aformentioned range listed, please consider follow up with your Primary Care Provider.   ________________________________________________________  The [facility] providers would like to encourage you to use [facility] to communicate with providers for non-urgent requests or questions.  Due to long hold times on the telephone, sending your provider a message by [facility] may be a faster and more efficient way to get a response.  Please allow 48 business hours for a response.  Please remember that this is for non-urgent requests.  _______________________________________________________ It was a pleasure to see you today!  Thank you for trusting me with your gastrointestinal care!

## 2023-07-27 NOTE — Progress Notes (Signed)
Agree with assessment/plan.  Raj Gupta, MD [facility] [phone]  

## 2023-07-28 ENCOUNTER — Other Ambulatory Visit: Payer: Self-pay

## 2023-07-28 DIAGNOSIS — D509 Iron deficiency anemia, unspecified: Secondary | ICD-10-CM

## 2023-07-31 DIAGNOSIS — Z419 Encounter for procedure for purposes other than remedying health state, unspecified: Secondary | ICD-10-CM | POA: Diagnosis not present

## 2023-08-12 ENCOUNTER — Inpatient Hospital Stay: Payer: BC Managed Care – PPO | Attending: Oncology | Admitting: Oncology

## 2023-08-12 VITALS — BP 122/89 | HR 100 | Temp 99.1°F | Resp 18 | Ht 62.0 in | Wt 168.7 lb

## 2023-08-12 DIAGNOSIS — Z79899 Other long term (current) drug therapy: Secondary | ICD-10-CM | POA: Insufficient documentation

## 2023-08-12 DIAGNOSIS — N920 Excessive and frequent menstruation with regular cycle: Secondary | ICD-10-CM | POA: Insufficient documentation

## 2023-08-12 DIAGNOSIS — D509 Iron deficiency anemia, unspecified: Secondary | ICD-10-CM | POA: Insufficient documentation

## 2023-08-12 DIAGNOSIS — D75839 Thrombocytosis, unspecified: Secondary | ICD-10-CM | POA: Insufficient documentation

## 2023-08-12 DIAGNOSIS — D500 Iron deficiency anemia secondary to blood loss (chronic): Secondary | ICD-10-CM | POA: Insufficient documentation

## 2023-08-12 DIAGNOSIS — F17200 Nicotine dependence, unspecified, uncomplicated: Secondary | ICD-10-CM | POA: Diagnosis not present

## 2023-08-12 NOTE — Assessment & Plan Note (Addendum)
-  Likely secondary to chronic blood loss from menorrhagia. -Patient is s/p hysterectomy on 07/08/2023 -Recent CBC and iron labs as above -Patient stated that she tolerated oral iron very poorly and had significant nausea and vomiting.  She is very reluctant to try it anymore -We discussed some of the risks, benefits, and alternatives of intravenous iron infusions. The patient is symptomatic from anemia and the iron level is critically low.  Patient desires to achieve higher levels of iron faster for adequate hematopoesis. Some of the side-effects to be expected including risks of infusion reactions, phlebitis, headaches, nausea and fatigue.  The patient is willing to proceed. Patient education material was dispensed.  Goal is to keep ferritin level greater than 50 and resolution of anemia   Return to clinic in 4 weeks with labs

## 2023-08-12 NOTE — Progress Notes (Signed)
[facility] HEMATOLOGY NEW VISIT  [place], Doralee Albino, FNP  REASON FOR REFERRAL: Iron deficiency anemia  SUMMARY OF HEMATOLOGIC HISTORY:    Latest Ref Rng & Units 07/27/2023   10:37 AM 07/01/2023    8:47 AM 05/06/2023    2:52 PM  CBC  WBC 4.0 - 10.5 K/uL 10.8  11.1    Hemoglobin 12.0 - 15.0 g/dL 9.0  8.5  96.2   Hematocrit 36.0 - 46.0 % 29.9  29.9  33.0   Platelets 150.0 - 400.0 K/uL 442.0  397     Lab Results  Component Value Date   IRON 19 (L) 07/27/2023   TIBC 504.0 (H) 07/27/2023   FERRITIN 4.4 (L) 07/27/2023    HISTORY OF PRESENT ILLNESS: Allison Galloway 42 y.o. female referred by GI for iron deficiency anemia.  She has a past medical history of anxiety, IBS with constipation, menorrhagia s/p hysterectomy on 07/05/2023.  Patient has recovered from surgery very well.   She reported that she feels fatigued all the time, no shortness of breath but some dizziness on exertion.  She stated that she has bilateral leg and hip pains at night that makes sleeping difficult.  She stated that prior to hysterectomy she was having significant menorrhagia, started her cycle in January and only got 2-week break from then until he had the surgery.  Reported bleeding through pads and a lot of clots.  She feels better since surgery.  She is a smoker, smokes 1 pack in 2 to 3 days for the past 20 years, nonalcoholic.  Does not know a lot of family history as she is not in contact with her biological family.  We discussed that her iron deficiency anemia is likely secondary to her menorrhagia.  Now that she is post hysterectomy, we should see an improvement in her anemia and iron status especially with IV iron.  Patient stated that she did not tolerate oral iron very well and would not like to take it anymore.  I have reviewed the past medical history, past surgical history, social history and family history with the patient   ALLERGIES:  has No Known  Allergies.  MEDICATIONS:  Current Outpatient Medications  Medication Sig Dispense Refill   Acetaminophen Extra Strength 500 MG TABS Take 2 tablets (1,000 mg total) by mouth every 6 (six) hours as needed. 120 tablet 2   blood glucose meter kit and supplies Dispense based on patient and insurance preference. Use up to four times daily as directed. (FOR ICD-10 E10.9, E11.9). 1 each 0   busPIRone (BUSPAR) 5 MG tablet Take 5 mg by mouth 3 (three) times daily as needed (for anxiety).     cyclobenzaprine (FLEXERIL) 10 MG tablet TAKE 1 TABLET BY MOUTH TWICE DAILY AS NEEDED FOR MUSCLE SPASMS 30 tablet 1   enalapril (VASOTEC) 2.5 MG tablet Take 1 tablet (2.5 mg total) by mouth daily. 90 tablet 3   famotidine (PEPCID) 40 MG tablet Take 1 tablet (40 mg total) by mouth at bedtime. 90 tablet 0   ibuprofen (ADVIL) 800 MG tablet Take 1 tablet (800 mg total) by mouth 3 (three) times daily with meals as needed for headache, moderate pain or cramping. 60 tablet 2   linaclotide (LINZESS) 145 MCG CAPS capsule Take 1 capsule (145 mcg total) by mouth daily before breakfast. 90 capsule 3   linaclotide (LINZESS) 290 MCG CAPS capsule Take 1 capsule (290 mcg total) by mouth daily before breakfast.  metFORMIN (GLUCOPHAGE) 850 MG tablet Take 1 tablet (850 mg total) by mouth 2 (two) times daily with a meal. 180 tablet 2   norethindrone (AYGESTIN) 5 MG tablet Take 2 tablets (10 mg total) by mouth daily. 60 tablet 2   oxyCODONE (OXY IR/ROXICODONE) 5 MG immediate release tablet Take 1-2 tablets (5-10 mg total) by mouth every 4 (four) hours as needed for moderate pain. 20 tablet 0   Peppermint Oil (IBGARD) 90 MG CPCR Take as directed. 16 capsule 0   polyethylene glycol powder (MIRALAX) 17 GM/SCOOP powder Take 17 g by mouth daily as needed for mild constipation. 238 g 0   No current facility-administered medications for this visit.     REVIEW OF SYSTEMS:   Constitutional: Denies fevers, chills or night sweats Eyes: Denies  blurriness of vision Ears, nose, mouth, throat, and face: Denies mucositis or sore throat Respiratory: Denies cough, dyspnea or wheezes Cardiovascular: Denies palpitation, chest discomfort or lower extremity swelling Gastrointestinal:  Denies nausea, heartburn or change in bowel habits Skin: Denies abnormal skin rashes Lymphatics: Denies new lymphadenopathy or easy bruising Neurological:Denies numbness, tingling or new weaknesses Behavioral/Psych: Mood is stable, no new changes  All other systems were reviewed with the patient and are negative.  PHYSICAL EXAMINATION:   Vitals:   08/12/23 1005  BP: 122/89  Pulse: 100  Resp: 18  Temp: 99.1 F (37.3 C)  SpO2: 100%    GENERAL:alert, no distress and comfortable SKIN: skin color, texture, turgor are normal, no rashes or significant lesions LUNGS: clear to auscultation and percussion with normal breathing effort HEART: regular rate & rhythm and no murmurs and no lower extremity edema ABDOMEN:abdomen soft, non-tender and normal bowel sounds Musculoskeletal:no cyanosis of digits and no clubbing  NEURO: alert & oriented x 3 with fluent speech  LABORATORY DATA:  I have reviewed the data as listed  Lab Results  Component Value Date   WBC 10.8 (H) 07/27/2023   NEUTROABS 7.1 07/27/2023   HGB 9.0 (L) 07/27/2023   HCT 29.9 (L) 07/27/2023   MCV 64.4 Repeated and verified X2. (L) 07/27/2023   PLT 442.0 (H) 07/27/2023      Component Value Date/Time   NA 137 07/27/2023 1037   NA 141 02/11/2023 0846   K 4.2 07/27/2023 1037   CL 105 07/27/2023 1037   CO2 24 07/27/2023 1037   GLUCOSE 139 (H) 07/27/2023 1037   BUN 12 07/27/2023 1037   BUN 10 02/11/2023 0846   CREATININE 0.91 07/27/2023 1037   CALCIUM 9.3 07/27/2023 1037   PROT 7.3 07/27/2023 1037   PROT 7.0 02/11/2023 0846   ALBUMIN 3.9 07/27/2023 1037   ALBUMIN 4.0 02/11/2023 0846   AST 12 07/27/2023 1037   ALT 13 07/27/2023 1037   ALKPHOS 76 07/27/2023 1037   BILITOT 0.3  07/27/2023 1037   BILITOT <0.2 02/11/2023 0846   GFRNONAA >60 07/01/2023 0847   GFRAA >60 07/11/2020 1926        Chemistry      Component Value Date/Time   NA 137 07/27/2023 1037   NA 141 02/11/2023 0846   K 4.2 07/27/2023 1037   CL 105 07/27/2023 1037   CO2 24 07/27/2023 1037   BUN 12 07/27/2023 1037   BUN 10 02/11/2023 0846   CREATININE 0.91 07/27/2023 1037      Component Value Date/Time   CALCIUM 9.3 07/27/2023 1037   ALKPHOS 76 07/27/2023 1037   AST 12 07/27/2023 1037   ALT 13 07/27/2023 1037   BILITOT  0.3 07/27/2023 1037   BILITOT <0.2 02/11/2023 0846     Lab Results  Component Value Date   IRON 19 (L) 07/27/2023   TIBC 504.0 (H) 07/27/2023   FERRITIN 4.4 (L) 07/27/2023      ASSESSMENT & PLAN:  Patient is a 42 year old female with iron deficiency anemia referred for IV iron replacement .  Thrombocytosis -Reactive likely secondary to iron deficiency anemia -Management of IDA as above -Continue to monitor for now  - Does not need any further workup at this time  Iron deficiency anemia due to chronic blood loss -Likely secondary to chronic blood loss from menorrhagia. -Patient is s/p hysterectomy on 07/08/2023 -Recent CBC and iron labs as above -Patient stated that she tolerated oral iron very poorly and had significant nausea and vomiting.  She is very reluctant to try it anymore -We discussed some of the risks, benefits, and alternatives of intravenous iron infusions. The patient is symptomatic from anemia and the iron level is critically low.  Patient desires to achieve higher levels of iron faster for adequate hematopoesis. Some of the side-effects to be expected including risks of infusion reactions, phlebitis, headaches, nausea and fatigue.  The patient is willing to proceed. Patient education material was dispensed.  Goal is to keep ferritin level greater than 50 and resolution of anemia   Return to clinic in 4 weeks with labs  Orders Placed This  Encounter  Procedures   Ferritin    Standing Status:   Future    Standing Expiration Date:   08/11/2024   Folate    Standing Status:   Future    Standing Expiration Date:   08/11/2024   Vitamin B12    Standing Status:   Future    Standing Expiration Date:   08/11/2024   CBC with Differential/Platelet    Standing Status:   Future    Standing Expiration Date:   08/11/2024    The total time spent in the appointment was 40 minutes encounter with patients including review of chart and various tests results, discussions about plan of care and coordination of care plan   All questions were answered. The patient knows to call the clinic with any problems, questions or concerns. No barriers to learning was detected.   [name], MD 9/13/202410:43 AM

## 2023-08-12 NOTE — Assessment & Plan Note (Addendum)
-  Reactive likely secondary to iron deficiency anemia -Management of IDA as above -Continue to monitor for now  - Does not need any further workup at this time

## 2023-08-15 ENCOUNTER — Inpatient Hospital Stay: Payer: BC Managed Care – PPO

## 2023-08-15 VITALS — BP 113/69 | HR 86 | Temp 98.3°F | Resp 20

## 2023-08-15 DIAGNOSIS — F17200 Nicotine dependence, unspecified, uncomplicated: Secondary | ICD-10-CM | POA: Diagnosis not present

## 2023-08-15 DIAGNOSIS — N920 Excessive and frequent menstruation with regular cycle: Secondary | ICD-10-CM | POA: Diagnosis not present

## 2023-08-15 DIAGNOSIS — D75839 Thrombocytosis, unspecified: Secondary | ICD-10-CM | POA: Diagnosis not present

## 2023-08-15 DIAGNOSIS — Z79899 Other long term (current) drug therapy: Secondary | ICD-10-CM | POA: Diagnosis not present

## 2023-08-15 DIAGNOSIS — D509 Iron deficiency anemia, unspecified: Secondary | ICD-10-CM | POA: Diagnosis not present

## 2023-08-15 DIAGNOSIS — D500 Iron deficiency anemia secondary to blood loss (chronic): Secondary | ICD-10-CM

## 2023-08-15 MED ORDER — DIPHENHYDRAMINE HCL 25 MG PO CAPS
50.0000 mg | ORAL_CAPSULE | Freq: Once | ORAL | Status: AC
  Administered 2023-08-15: 50 mg via ORAL
  Filled 2023-08-15: qty 2

## 2023-08-15 MED ORDER — SODIUM CHLORIDE 0.9 % IV SOLN
510.0000 mg | Freq: Once | INTRAVENOUS | Status: AC
  Administered 2023-08-15: 510 mg via INTRAVENOUS
  Filled 2023-08-15: qty 510

## 2023-08-15 MED ORDER — SODIUM CHLORIDE 0.9 % IV SOLN: Freq: Once | INTRAVENOUS | Status: AC

## 2023-08-15 MED ORDER — ACETAMINOPHEN 325 MG PO TABS
650.0000 mg | ORAL_TABLET | Freq: Once | ORAL | Status: AC
  Administered 2023-08-15: 650 mg via ORAL
  Filled 2023-08-15: qty 2

## 2023-08-15 NOTE — Patient Instructions (Signed)
[facility]  Discharge Instructions: Thank you for choosing [facility] to provide your oncology and hematology care.  If you have a lab appointment with the Cancer Center - please note that after April 8th, 2024, all labs will be drawn in the cancer center.  You do not have to check in or register with the main entrance as you have in the past but will complete your check-in in the cancer center.  Wear comfortable clothing and clothing appropriate for easy access to any Portacath or PICC line.   We strive to give you quality time with your provider. You may need to reschedule your appointment if you arrive late (15 or more minutes).  Arriving late affects you and other patients whose appointments are after yours.  Also, if you miss three or more appointments without notifying the office, you may be dismissed from the clinic at the provider's discretion.      For prescription refill requests, have your pharmacy contact our office and allow 72 hours for refills to be completed.    Today you received the following Feraheme infusion.  Ferumoxytol Injection What is this medication? FERUMOXYTOL (FER ue MOX i tol) treats low levels of iron in your body (iron deficiency anemia). Iron is a mineral that plays an important role in making red blood cells, which carry oxygen from your lungs to the rest of your body. This medicine may be used for other purposes; ask your health care provider or pharmacist if you have questions. COMMON BRAND NAME(S): Feraheme What should I tell my care team before I take this medication? They need to know if you have any of these conditions: Anemia not caused by low iron levels High levels of iron in the blood Magnetic resonance imaging (MRI) test scheduled An unusual or allergic reaction to iron, other medications, foods, dyes, or preservatives Pregnant or trying to get pregnant Breastfeeding How should I use this medication? This  medication is injected into a vein. It is given by your care team in a hospital or clinic setting. Talk to your care team the use of this medication in children. Special care may be needed. Overdosage: If you think you have taken too much of this medicine contact a poison control center or emergency room at once. NOTE: This medicine is only for you. Do not share this medicine with others. What if I miss a dose? It is important not to miss your dose. Call your care team if you are unable to keep an appointment. What may interact with this medication? Other iron products This list may not describe all possible interactions. Give your health care provider a list of all the medicines, herbs, non-prescription drugs, or dietary supplements you use. Also tell them if you smoke, drink alcohol, or use illegal drugs. Some items may interact with your medicine. What should I watch for while using this medication? Visit your care team regularly. Tell your care team if your symptoms do not start to get better or if they get worse. You may need blood work done while you are taking this medication. You may need to follow a special diet. Talk to your care team. Foods that contain iron include: whole grains/cereals, dried fruits, beans, or peas, leafy green vegetables, and organ meats (liver, kidney). What side effects may I notice from receiving this medication? Side effects that you should report to your care team as soon as possible: Allergic reactions--skin rash, itching, hives, swelling of the  face, lips, tongue, or throat Low blood pressure--dizziness, feeling faint or lightheaded, blurry vision Shortness of breath Side effects that usually do not require medical attention (report to your care team if they continue or are bothersome): Flushing Headache Joint pain Muscle pain Nausea Pain, redness, or irritation at injection site This list may not describe all possible side effects. Call your doctor for  medical advice about side effects. You may report side effects to FDA at 1-800-FDA-1088. Where should I keep my medication? This medication is given in a hospital or clinic. It will not be stored at home. NOTE: This sheet is a summary. It may not cover all possible information. If you have questions about this medicine, talk to your doctor, pharmacist, or health care provider.  2024 Elsevier/Gold Standard (2023-04-22 00:00:00)   To help prevent nausea and vomiting after your treatment, we encourage you to take your nausea medication as directed.  BELOW ARE SYMPTOMS THAT SHOULD BE REPORTED IMMEDIATELY: *FEVER GREATER THAN 100.4 F (38 C) OR HIGHER *CHILLS OR SWEATING *NAUSEA AND VOMITING THAT IS NOT CONTROLLED WITH YOUR NAUSEA MEDICATION *UNUSUAL SHORTNESS OF BREATH *UNUSUAL BRUISING OR BLEEDING *URINARY PROBLEMS (pain or burning when urinating, or frequent urination) *BOWEL PROBLEMS (unusual diarrhea, constipation, pain near the anus) TENDERNESS IN MOUTH AND THROAT WITH OR WITHOUT PRESENCE OF ULCERS (sore throat, sores in mouth, or a toothache) UNUSUAL RASH, SWELLING OR PAIN  UNUSUAL VAGINAL DISCHARGE OR ITCHING   Items with * indicate a potential emergency and should be followed up as soon as possible or go to the Emergency Department if any problems should occur.  Please show the CHEMOTHERAPY ALERT CARD or IMMUNOTHERAPY ALERT CARD at check-in to the Emergency Department and triage nurse.  Should you have questions after your visit or need to cancel or reschedule your appointment, please contact [facility] [phone]  and follow the prompts.  Office hours are 8:00 a.m. to 4:30 p.m. Monday - Friday. Please note that voicemails left after 4:00 p.m. may not be returned until the following business day.  We are closed weekends and major holidays. You have access to a nurse at all times for urgent questions. Please call the main number to the clinic [phone] and  follow the prompts.  For any non-urgent questions, you may also contact your provider using MyChart. We now offer e-Visits for anyone 60 and older to request care online for non-urgent symptoms. For details visit [email or web address].   Also download the MyChart app! Go to the app store, search "MyChart", open the app, select [facility], and log in with your MyChart username and password.

## 2023-08-15 NOTE — Progress Notes (Signed)
Patient presents today for iron infusion of Feraheme.  Patient is in satisfactory condition with no new complaints voiced.  Vital signs are stable.  We will proceed with infusion per provider orders.    Patient tolerated treatment well with no complaints voiced.  Patient left ambulatory in stable condition.  Vital signs stable at discharge.  Follow up as scheduled.

## 2023-08-16 ENCOUNTER — Encounter: Payer: Self-pay | Admitting: Family Medicine

## 2023-08-16 ENCOUNTER — Ambulatory Visit (INDEPENDENT_AMBULATORY_CARE_PROVIDER_SITE_OTHER): Payer: BC Managed Care – PPO | Admitting: Family Medicine

## 2023-08-16 VITALS — BP 134/89 | HR 98 | Temp 97.9°F | Ht 62.0 in | Wt 169.0 lb

## 2023-08-16 DIAGNOSIS — N939 Abnormal uterine and vaginal bleeding, unspecified: Secondary | ICD-10-CM | POA: Diagnosis not present

## 2023-08-16 DIAGNOSIS — E119 Type 2 diabetes mellitus without complications: Secondary | ICD-10-CM

## 2023-08-16 DIAGNOSIS — F411 Generalized anxiety disorder: Secondary | ICD-10-CM

## 2023-08-16 DIAGNOSIS — Z7984 Long term (current) use of oral hypoglycemic drugs: Secondary | ICD-10-CM

## 2023-08-16 DIAGNOSIS — F449 Dissociative and conversion disorder, unspecified: Secondary | ICD-10-CM

## 2023-08-16 DIAGNOSIS — F447 Conversion disorder with mixed symptom presentation: Secondary | ICD-10-CM | POA: Diagnosis not present

## 2023-08-16 DIAGNOSIS — F331 Major depressive disorder, recurrent, moderate: Secondary | ICD-10-CM

## 2023-08-16 LAB — BAYER DCA HB A1C WAIVED: HB A1C (BAYER DCA - WAIVED): 6.7 % — ABNORMAL HIGH (ref 4.8–5.6)

## 2023-08-16 NOTE — Patient Instructions (Signed)

## 2023-08-16 NOTE — Progress Notes (Signed)
Subjective:  Patient ID: Allison Galloway, female    DOB: [birth year], 42 y.o.   MRN: [id number]  Patient Care Team: [name], FNP as PCP - General (Family Medicine) Christell Constant, MD as PCP - Cardiology (Cardiology) Tanda Rockers, NP as Nurse Practitioner (Radiology) Michaelle Copas, MD as Referring Physician (Optometry)   Chief Complaint:  Diabetes (3 month follow up )   HPI: Allison Galloway is a 42 y.o. female presenting on 08/16/2023 for Diabetes (3 month follow up )    1. Type 2 diabetes mellitus without complication, without long-term current use of insulin (HCC) She is taking her metformin as prescribed and tolerating well. No polyuria, polyphagia, or polydipsia. Tries to follow a healthy diet, does not exercise on a regular basis.no changes in vision. No weakness or confusion.   2. Conversion disorder / recurrent depression / GAD She has an appointment today with a specialist. States her symptoms still come and go but are doing ok today. Feels her anxiety and depression continue to fluctuate. No SI or HI.     08/16/2023    9:44 AM 05/13/2023    8:47 AM 02/11/2023    8:58 AM 12/17/2022    8:07 AM 12/01/2022    8:34 AM  Depression screen PHQ 2/9  Decreased Interest 2 2 3  0 2  Down, Depressed, Hopeless 1 1 3  0 2  PHQ - 2 Score 3 3 6  0 4  Altered sleeping 3 0 3  2  Tired, decreased energy 3 3 3  2   Change in appetite 2 3 3  2   Feeling bad or failure about yourself  1 0 2  2  Trouble concentrating 1 1 2  2   Moving slowly or fidgety/restless 2 0 2  2  Suicidal thoughts 0 0 0  0  PHQ-9 Score 15 10 21  16   Difficult doing work/chores Very difficult Somewhat difficult Very difficult  Very difficult      08/16/2023    9:44 AM 05/13/2023    8:47 AM 02/11/2023    8:59 AM 12/01/2022    8:34 AM  GAD 7 : Generalized Anxiety Score  Nervous, Anxious, on Edge 1 0 3 2  Control/stop worrying 1 1 3 2   Worry too much - different things 1 1 2 2   Trouble relaxing 2 1 3  2   Restless 1 0 2 2  Easily annoyed or irritable 2 2 3 3   Afraid - awful might happen 0 0 0 2  Total GAD 7 Score 8 5 16 15   Anxiety Difficulty Somewhat difficult Somewhat difficult Very difficult Very difficult     3. Abnormal uterine bleeding (AUB) S/P hysterectomy 07/05/2023. Has been doing well since surgery. Has had to see hematology and is getting transfusions for IDA. She is tolerating the transfusions well.      Relevant past medical, surgical, family, and social history reviewed and updated as indicated.  Allergies and medications reviewed and updated. Data reviewed: Chart in Epic.   Past Medical History:  Diagnosis Date   Abnormal vaginal bleeding 2024   Anxiety    Follows w/ PCP   Arthritis    left knee   Chronic low back pain with bilateral sciatica    Constipation    Follows with [facility] for chronic constipation.   Conversion disorder    speech difficulty, tremors, weakness, negative cardiac, pulmonary & neuro imaging workup. Patient states all symptoms were thought to  be related to conversion disorder.   Depression    Follows w/ PCP.   Facial weakness 2023   left-sided facial weakiness, speech difficulty w/ stuttering, left-sided weakness / Saw neurologist,Dr. Darvin Neighbours on 03/15/22 in Epic.   Headache    hx of migraines   SOB (shortness of breath)    Former smoker. Follows w/ Dr. Francine Graven pulmonology and Dr. Edwyna Perfect, cardiology. 03/03/22 Echo within normal limits, EF 65 -70%. Patient states SOB r/t panic attacks.   Type 2 diabetes mellitus (HCC)    Follows w/ PCP, Gilford Silvius, FNP.   Wears glasses     Past Surgical History:  Procedure Laterality Date   ABDOMINAL HYSTERECTOMY     CESAREAN SECTION     3   CYSTOSCOPY N/A 07/05/2023   Procedure: CYSTOSCOPY;  Surgeon: Lorriane Shire, MD;  Location: [facility];  Service: Gynecology;  Laterality: N/A;   KNEE SURGERY Left    x 3   ROBOTIC ASSISTED TOTAL HYSTERECTOMY WITH BILATERAL  SALPINGO OOPHERECTOMY Bilateral 07/05/2023   Procedure: XI ROBOTIC ASSISTED TOTAL LAPRASCOPIC HYSTERECTOMY WITH BILATERAL SALPINGECTOMY,LYSIS OF ACHESIONS, TAP BLOCK;  Surgeon: Lorriane Shire, MD;  Location: [facility];  Service: Gynecology;  Laterality: Bilateral;   TUBAL LIGATION  2006    Social History   Socioeconomic History   Marital status: Married    Spouse name: Not on file   Number of children: Not on file   Years of education: Not on file   Highest education level: Not on file  Occupational History   Not on file  Tobacco Use   Smoking status: Former    Current packs/day: 0.00    Types: Cigarettes    Quit date: 08/2021    Years since quitting: 1.9   Smokeless tobacco: Never  Vaping Use   Vaping status: Some Days   Substances: Nicotine, Flavoring  Substance and Sexual Activity   Alcohol use: No   Drug use: Not Currently    Types: Marijuana    Comment: last used 2023   Sexual activity: Yes    Partners: Male    Birth control/protection: Surgical    Comment: tubal, menarche 42yo, sexual debut 42yo  Other Topics Concern   Not on file  Social History Narrative   Not on file   Social Determinants of Health   Financial Resource Strain: Not on file  Food Insecurity: No Food Insecurity (08/12/2023)   Hunger Vital Sign    Worried About Running Out of Food in the Last Year: Never true    Ran Out of Food in the Last Year: Never true  Transportation Needs: No Transportation Needs (08/12/2023)   PRAPARE - Administrator, Civil Service (Medical): No    Lack of Transportation (Non-Medical): No  Physical Activity: Not on file  Stress: Not on file  Social Connections: Not on file  Intimate Partner Violence: Not At Risk (08/12/2023)   Humiliation, Afraid, Rape, and Kick questionnaire    Fear of Current or Ex-Partner: No    Emotionally Abused: No    Physically Abused: No    Sexually Abused: No    Outpatient Encounter Medications as of  08/16/2023  Medication Sig   Acetaminophen Extra Strength 500 MG TABS Take 2 tablets (1,000 mg total) by mouth every 6 (six) hours as needed.   blood glucose meter kit and supplies Dispense based on patient and insurance preference. Use up to four times daily as directed. (FOR ICD-10 E10.9, E11.9).   busPIRone (  BUSPAR) 5 MG tablet Take 5 mg by mouth 3 (three) times daily as needed (for anxiety).   cyclobenzaprine (FLEXERIL) 10 MG tablet TAKE 1 TABLET BY MOUTH TWICE DAILY AS NEEDED FOR MUSCLE SPASMS   enalapril (VASOTEC) 2.5 MG tablet Take 1 tablet (2.5 mg total) by mouth daily.   famotidine (PEPCID) 40 MG tablet Take 1 tablet (40 mg total) by mouth at bedtime.   ibuprofen (ADVIL) 800 MG tablet Take 1 tablet (800 mg total) by mouth 3 (three) times daily with meals as needed for headache, moderate pain or cramping.   linaclotide (LINZESS) 145 MCG CAPS capsule Take 1 capsule (145 mcg total) by mouth daily before breakfast.   linaclotide (LINZESS) 290 MCG CAPS capsule Take 1 capsule (290 mcg total) by mouth daily before breakfast.   metFORMIN (GLUCOPHAGE) 850 MG tablet Take 1 tablet (850 mg total) by mouth 2 (two) times daily with a meal.   norethindrone (AYGESTIN) 5 MG tablet Take 2 tablets (10 mg total) by mouth daily.   Peppermint Oil (IBGARD) 90 MG CPCR Take as directed.   polyethylene glycol powder (MIRALAX) 17 GM/SCOOP powder Take 17 g by mouth daily as needed for mild constipation.   [DISCONTINUED] oxyCODONE (OXY IR/ROXICODONE) 5 MG immediate release tablet Take 1-2 tablets (5-10 mg total) by mouth every 4 (four) hours as needed for moderate pain.   No facility-administered encounter medications on file as of 08/16/2023.    No Known Allergies  Review of Systems  Constitutional:  Positive for activity change, appetite change and fatigue. Negative for chills, diaphoresis, fever and unexpected weight change.  HENT: Negative.    Eyes: Negative.  Negative for photophobia and visual disturbance.   Respiratory:  Negative for cough, chest tightness and shortness of breath.   Cardiovascular:  Negative for chest pain, palpitations and leg swelling.  Gastrointestinal:  Negative for abdominal pain, blood in stool, constipation, diarrhea, nausea and vomiting.  Endocrine: Negative.  Negative for polydipsia, polyphagia and polyuria.  Genitourinary:  Negative for decreased urine volume, difficulty urinating, dysuria, frequency and urgency.  Musculoskeletal:  Negative for arthralgias and myalgias.  Skin: Negative.   Allergic/Immunologic: Negative.   Neurological:  Negative for dizziness, tremors, seizures, syncope, facial asymmetry, speech difficulty, weakness, light-headedness, numbness and headaches.  Hematological: Negative.   Psychiatric/Behavioral:  Positive for agitation, decreased concentration and sleep disturbance. Negative for behavioral problems, confusion, hallucinations, self-injury and suicidal ideas. The patient is nervous/anxious. The patient is not hyperactive.   All other systems reviewed and are negative.       Objective:  BP 134/89   Pulse 98   Temp 97.9 F (36.6 C)   Ht 5\' 2"  (1.575 m)   Wt 169 lb (76.7 kg)   SpO2 98%   BMI 30.91 kg/m    Wt Readings from Last 3 Encounters:  08/16/23 169 lb (76.7 kg)  08/12/23 168 lb 10.4 oz (76.5 kg)  07/27/23 170 lb 6 oz (77.3 kg)    Physical Exam Vitals and nursing note reviewed.  Constitutional:      General: She is not in acute distress.    Appearance: Normal appearance. She is well-developed and well-groomed. She is obese. She is not ill-appearing, toxic-appearing or diaphoretic.  HENT:     Head: Normocephalic and atraumatic.     Jaw: There is normal jaw occlusion.     Right Ear: Hearing normal.     Left Ear: Hearing normal.     Nose: Nose normal.     Mouth/Throat:  Lips: Pink.     Mouth: Mucous membranes are moist.     Pharynx: Oropharynx is clear. Uvula midline.  Eyes:     General: Lids are normal.      Extraocular Movements: Extraocular movements intact.     Conjunctiva/sclera: Conjunctivae normal.     Pupils: Pupils are equal, round, and reactive to light.  Neck:     Thyroid: No thyroid mass, thyromegaly or thyroid tenderness.     Vascular: No carotid bruit or JVD.     Trachea: Trachea and phonation normal.  Cardiovascular:     Rate and Rhythm: Normal rate and regular rhythm.     Chest Wall: PMI is not displaced.     Pulses: Normal pulses.     Heart sounds: Normal heart sounds. No murmur heard.    No friction rub. No gallop.  Pulmonary:     Effort: Pulmonary effort is normal. No respiratory distress.     Breath sounds: Normal breath sounds. No wheezing.  Abdominal:     General: Bowel sounds are normal. There is no distension or abdominal bruit.     Palpations: Abdomen is soft. There is no hepatomegaly or splenomegaly.     Tenderness: There is no abdominal tenderness. There is no right CVA tenderness or left CVA tenderness.     Hernia: No hernia is present.  Musculoskeletal:        General: Normal range of motion.     Cervical back: Normal range of motion and neck supple.     Right lower leg: No edema.     Left lower leg: No edema.  Lymphadenopathy:     Cervical: No cervical adenopathy.  Skin:    General: Skin is warm and dry.     Capillary Refill: Capillary refill takes less than 2 seconds.     Coloration: Skin is not cyanotic, jaundiced or pale.     Findings: No rash.  Neurological:     General: No focal deficit present.     Mental Status: She is alert and oriented to person, place, and time.     Sensory: Sensation is intact.     Motor: Motor function is intact.     Coordination: Coordination is intact.     Gait: Gait is intact.     Deep Tendon Reflexes: Reflexes are normal and symmetric.  Psychiatric:        Attention and Perception: Attention and perception normal.        Mood and Affect: Mood and affect normal.        Speech: Speech normal.        Behavior:  Behavior normal. Behavior is cooperative.        Thought Content: Thought content normal.        Cognition and Memory: Cognition and memory normal.        Judgment: Judgment normal.     Results for orders placed or performed in visit on 07/27/23  IBC + Ferritin  Result Value Ref Range   Iron 19 (L) 42 - 145 ug/dL   Transferrin 119.1 478.2 - 360.0 mg/dL   Saturation Ratios 3.8 (L) 20.0 - 50.0 %   Ferritin 4.4 (L) 10.0 - 291.0 ng/mL   TIBC 504.0 (H) 250.0 - 450.0 mcg/dL  Comprehensive metabolic panel  Result Value Ref Range   Sodium 137 135 - 145 mEq/L   Potassium 4.2 3.5 - 5.1 mEq/L   Chloride 105 96 - 112 mEq/L   CO2 24 19 - 32 mEq/L  Glucose, Bld 139 (H) 70 - 99 mg/dL   BUN 12 6 - 23 mg/dL   Creatinine, Ser 2.59 0.40 - 1.20 mg/dL   Total Bilirubin 0.3 0.2 - 1.2 mg/dL   Alkaline Phosphatase 76 39 - 117 U/L   AST 12 0 - 37 U/L   ALT 13 0 - 35 U/L   Total Protein 7.3 6.0 - 8.3 g/dL   Albumin 3.9 3.5 - 5.2 g/dL   GFR 56.38 >75.64 mL/min   Calcium 9.3 8.4 - 10.5 mg/dL  CBC with Differential/Platelet  Result Value Ref Range   WBC 10.8 (H) 4.0 - 10.5 K/uL   RBC 4.64 3.87 - 5.11 Mil/uL   Hemoglobin 9.0 (L) 12.0 - 15.0 g/dL   HCT 33.2 (L) 95.1 - 88.4 %   MCV 64.4 Repeated and verified X2. (L) 78.0 - 100.0 fl   MCHC 30.3 30.0 - 36.0 g/dL   RDW 16.6 (H) 06.3 - 01.6 %   Platelets 442.0 (H) 150.0 - 400.0 K/uL   Neutrophils Relative % 65.8 43.0 - 77.0 %   Lymphocytes Relative 22.6 12.0 - 46.0 %   Monocytes Relative 7.4 3.0 - 12.0 %   Eosinophils Relative 3.9 0.0 - 5.0 %   Basophils Relative 0.3 0.0 - 3.0 %   Neutro Abs 7.1 1.4 - 7.7 K/uL   Lymphs Abs 2.4 0.7 - 4.0 K/uL   Monocytes Absolute 0.8 0.1 - 1.0 K/uL   Eosinophils Absolute 0.4 0.0 - 0.7 K/uL   Basophils Absolute 0.0 0.0 - 0.1 K/uL       Pertinent labs & imaging results that were available during my care of the patient were reviewed by me and considered in my medical decision making.  Assessment & Plan:  Jerrilee was  seen today for diabetes.  Diagnoses and all orders for this visit:  Type 2 diabetes mellitus without complication, without long-term current use of insulin (HCC) A1C 6.7 today. Continue current regimen along with diet and exercise.  -     Bayer DCA Hb A1c Waived  Abnormal uterine bleeding (AUB) S/P hysterectomy and doing well.   Generalized anxiety disorder Conversion disorder Moderate episode of recurrent major depressive disorder (HCC) Has initial appointment today. Continue current medications unless adjusted by specialist.     Continue all other maintenance medications.  Follow up plan: Return in about 3 months (around 11/15/2023) for DM.   Continue healthy lifestyle choices, including diet (rich in fruits, vegetables, and lean proteins, and low in salt and simple carbohydrates) and exercise (at least 30 minutes of moderate physical activity daily).  Educational handout given for DM  The above assessment and management plan was discussed with the patient. The patient verbalized understanding of and has agreed to the management plan. Patient is aware to call the clinic if they develop any new symptoms or if symptoms persist or worsen. Patient is aware when to return to the clinic for a follow-up visit. Patient educated on when it is appropriate to go to the emergency department.   Kari Baars, FNP-C [facility] [phone]

## 2023-08-18 ENCOUNTER — Ambulatory Visit (INDEPENDENT_AMBULATORY_CARE_PROVIDER_SITE_OTHER): Payer: BC Managed Care – PPO

## 2023-08-18 DIAGNOSIS — E119 Type 2 diabetes mellitus without complications: Secondary | ICD-10-CM | POA: Diagnosis not present

## 2023-08-18 LAB — HM DIABETES EYE EXAM

## 2023-08-18 NOTE — Progress Notes (Signed)
Allison Galloway arrived 08/18/2023 and has given verbal consent to obtain images and complete their overdue diabetic retinal screening.  The images have been sent to an ophthalmologist or optometrist for review and interpretation.  Results will be sent back to [name], FNP for review.  Patient has been informed they will be contacted when we receive the results via telephone or MyChart

## 2023-08-22 ENCOUNTER — Inpatient Hospital Stay: Payer: BC Managed Care – PPO

## 2023-08-22 VITALS — BP 113/78 | HR 68 | Temp 96.7°F | Resp 18

## 2023-08-22 DIAGNOSIS — Z79899 Other long term (current) drug therapy: Secondary | ICD-10-CM | POA: Diagnosis not present

## 2023-08-22 DIAGNOSIS — F17200 Nicotine dependence, unspecified, uncomplicated: Secondary | ICD-10-CM | POA: Diagnosis not present

## 2023-08-22 DIAGNOSIS — D75839 Thrombocytosis, unspecified: Secondary | ICD-10-CM | POA: Diagnosis not present

## 2023-08-22 DIAGNOSIS — N920 Excessive and frequent menstruation with regular cycle: Secondary | ICD-10-CM | POA: Diagnosis not present

## 2023-08-22 DIAGNOSIS — D509 Iron deficiency anemia, unspecified: Secondary | ICD-10-CM | POA: Diagnosis not present

## 2023-08-22 DIAGNOSIS — D500 Iron deficiency anemia secondary to blood loss (chronic): Secondary | ICD-10-CM

## 2023-08-22 MED ORDER — SODIUM CHLORIDE 0.9 % IV SOLN: Freq: Once | INTRAVENOUS | Status: AC

## 2023-08-22 MED ORDER — ACETAMINOPHEN 325 MG PO TABS
650.0000 mg | ORAL_TABLET | Freq: Once | ORAL | Status: AC
  Administered 2023-08-22: 650 mg via ORAL
  Filled 2023-08-22: qty 2

## 2023-08-22 MED ORDER — DIPHENHYDRAMINE HCL 25 MG PO CAPS
50.0000 mg | ORAL_CAPSULE | Freq: Once | ORAL | Status: AC
  Administered 2023-08-22: 50 mg via ORAL
  Filled 2023-08-22: qty 2

## 2023-08-22 MED ORDER — SODIUM CHLORIDE 0.9 % IV SOLN
510.0000 mg | Freq: Once | INTRAVENOUS | Status: AC
  Administered 2023-08-22: 510 mg via INTRAVENOUS
  Filled 2023-08-22: qty 510

## 2023-08-22 NOTE — Progress Notes (Signed)
Patient tolerated iron infusion with no complaints voiced.  Peripheral IV site clean and dry with good blood return noted before and after infusion.  Band aid applied.  VSS with discharge and left in satisfactory condition with no s/s of distress noted.   

## 2023-08-22 NOTE — Patient Instructions (Signed)

## 2023-08-25 DIAGNOSIS — F447 Conversion disorder with mixed symptom presentation: Secondary | ICD-10-CM | POA: Diagnosis not present

## 2023-08-26 ENCOUNTER — Encounter: Payer: Self-pay | Admitting: Obstetrics and Gynecology

## 2023-08-26 ENCOUNTER — Ambulatory Visit (INDEPENDENT_AMBULATORY_CARE_PROVIDER_SITE_OTHER): Payer: BC Managed Care – PPO | Admitting: Obstetrics and Gynecology

## 2023-08-26 VITALS — BP 121/77 | HR 61 | Wt 169.0 lb

## 2023-08-26 DIAGNOSIS — Z09 Encounter for follow-up examination after completed treatment for conditions other than malignant neoplasm: Secondary | ICD-10-CM

## 2023-08-26 NOTE — Progress Notes (Unsigned)
   POSTOPERATIVE VISIT NOTE   Subjective:     Allison Galloway is a 42 y.o. [phone] who presents to the clinic 7 weeks status post {gyn surgeries:13997} for {gyn surg indications maj:13998}. Eating a regular diet {with-without:5700} difficulty. Bowel movements are {normal/abnormal***:19619}. {pain control:13522::"The patient is not having any pain."} Incision: no problem Vaginal bleeding: none, some discharge, grey Resumed sexual acitivity: no  Has been getting iron transfusions and so d/c linzess because of GI symptoms Not having any urinary symptoms Feels like things are "evening out" including BP, A1c, energy levels, etc  {Common ambulatory SmartLinks:19316}.   Review of Systems {ros; complete:30496}    Objective:    BP 121/77   Pulse 61   Wt 169 lb (76.7 kg)   LMP 10/02/2022 (Approximate) Comment: Patient has had irregular off and on bleeding in 2024.  BMI 30.91 kg/m  General:  {gen appearance:16600}  Abdomen: {post op abd exam:13497::"soft","bowel sounds active","non-tender"}  Incision:   {incision:13716::"healing well","no drainage","no dehiscence","no erythema","no hernia","no seroma","incision well approximated","no swelling"}  Pelvic:    Normal external genitalia, nontender levator ani's bilaterally , visibly intact cuff and palpated intact, nontender cuff    Pathology Results: ***   Assessment:   Doing well postoperatively. Operative findings again reviewed. Pathology report discussed.   There are no diagnoses linked to this encounter.   Plan:   1. Continue any current medications. 2. Meeting postop goals, resum usual care 3. Activity restrictions:  pelvic rest until 12wks postop 4. Follow up as needed   Lorriane Shire, MD Obstetrician & Gynecologist, [facility] for [employer], [facility]

## 2023-08-30 DIAGNOSIS — Z419 Encounter for procedure for purposes other than remedying health state, unspecified: Secondary | ICD-10-CM | POA: Diagnosis not present

## 2023-08-31 DIAGNOSIS — F447 Conversion disorder with mixed symptom presentation: Secondary | ICD-10-CM | POA: Diagnosis not present

## 2023-09-08 DIAGNOSIS — F447 Conversion disorder with mixed symptom presentation: Secondary | ICD-10-CM | POA: Diagnosis not present

## 2023-09-14 DIAGNOSIS — F447 Conversion disorder with mixed symptom presentation: Secondary | ICD-10-CM | POA: Diagnosis not present

## 2023-09-15 ENCOUNTER — Inpatient Hospital Stay: Payer: BC Managed Care – PPO | Attending: Oncology

## 2023-09-15 DIAGNOSIS — D500 Iron deficiency anemia secondary to blood loss (chronic): Secondary | ICD-10-CM | POA: Insufficient documentation

## 2023-09-15 DIAGNOSIS — F17200 Nicotine dependence, unspecified, uncomplicated: Secondary | ICD-10-CM | POA: Diagnosis not present

## 2023-09-15 DIAGNOSIS — D75839 Thrombocytosis, unspecified: Secondary | ICD-10-CM | POA: Diagnosis not present

## 2023-09-15 DIAGNOSIS — Z79899 Other long term (current) drug therapy: Secondary | ICD-10-CM | POA: Insufficient documentation

## 2023-09-15 DIAGNOSIS — N920 Excessive and frequent menstruation with regular cycle: Secondary | ICD-10-CM | POA: Diagnosis not present

## 2023-09-15 LAB — CBC WITH DIFFERENTIAL/PLATELET
Abs Immature Granulocytes: 0.04 10*3/uL (ref 0.00–0.07)
Basophils Absolute: 0 10*3/uL (ref 0.0–0.1)
Basophils Relative: 0 %
Eosinophils Absolute: 0.3 10*3/uL (ref 0.0–0.5)
Eosinophils Relative: 3 %
HCT: 44.5 % (ref 36.0–46.0)
Hemoglobin: 14.4 g/dL (ref 12.0–15.0)
Immature Granulocytes: 0 %
Lymphocytes Relative: 31 %
Lymphs Abs: 3.5 10*3/uL (ref 0.7–4.0)
MCH: 25.1 pg — ABNORMAL LOW (ref 26.0–34.0)
MCHC: 32.4 g/dL (ref 30.0–36.0)
MCV: 77.5 fL — ABNORMAL LOW (ref 80.0–100.0)
Monocytes Absolute: 0.6 10*3/uL (ref 0.1–1.0)
Monocytes Relative: 5 %
Neutro Abs: 6.8 10*3/uL (ref 1.7–7.7)
Neutrophils Relative %: 61 %
Platelets: 280 10*3/uL (ref 150–400)
RBC: 5.74 MIL/uL — ABNORMAL HIGH (ref 3.87–5.11)
WBC: 11.3 10*3/uL — ABNORMAL HIGH (ref 4.0–10.5)
nRBC: 0 % (ref 0.0–0.2)

## 2023-09-15 LAB — FERRITIN: Ferritin: 71 ng/mL (ref 11–307)

## 2023-09-15 LAB — VITAMIN B12: Vitamin B-12: 165 pg/mL — ABNORMAL LOW (ref 180–914)

## 2023-09-15 LAB — FOLATE: Folate: 6.4 ng/mL (ref >5.9)

## 2023-09-21 DIAGNOSIS — F447 Conversion disorder with mixed symptom presentation: Secondary | ICD-10-CM | POA: Diagnosis not present

## 2023-09-22 ENCOUNTER — Inpatient Hospital Stay: Payer: BC Managed Care – PPO | Admitting: Oncology

## 2023-09-22 ENCOUNTER — Encounter: Payer: Self-pay | Admitting: Oncology

## 2023-09-22 VITALS — BP 129/85 | HR 76 | Temp 98.0°F | Resp 18 | Wt 169.8 lb

## 2023-09-22 DIAGNOSIS — D75839 Thrombocytosis, unspecified: Secondary | ICD-10-CM | POA: Diagnosis not present

## 2023-09-22 DIAGNOSIS — D500 Iron deficiency anemia secondary to blood loss (chronic): Secondary | ICD-10-CM

## 2023-09-22 DIAGNOSIS — N920 Excessive and frequent menstruation with regular cycle: Secondary | ICD-10-CM | POA: Diagnosis not present

## 2023-09-22 DIAGNOSIS — Z87891 Personal history of nicotine dependence: Secondary | ICD-10-CM | POA: Diagnosis not present

## 2023-09-22 DIAGNOSIS — Z79899 Other long term (current) drug therapy: Secondary | ICD-10-CM | POA: Diagnosis not present

## 2023-09-22 DIAGNOSIS — F17200 Nicotine dependence, unspecified, uncomplicated: Secondary | ICD-10-CM | POA: Diagnosis not present

## 2023-09-22 MED ORDER — FERROUS SULFATE 325 (65 FE) MG PO TBEC: 325.0000 mg | DELAYED_RELEASE_TABLET | ORAL | 3 refills | Status: DC

## 2023-09-22 NOTE — Assessment & Plan Note (Signed)
Patient continues to smoke, albeit less frequently. Discussed the increased risk of cancers and systemic inflammation. -Strongly advised patient to quit smoking.

## 2023-09-22 NOTE — Assessment & Plan Note (Signed)
Improved hemoglobin from 9 to 14.4 and ferritin from 4.4 to 71 after 1g IV iron administration. Patient has not been taking oral iron due to intolerance. Discussed the need for oral iron supplementation and potential GI side effects. -Start oral iron supplementation every other day. -If constipation occurs, patient can take Miralax. -Check hemoglobin and ferritin levels in three months. -Patient has an upcoming appointment with GI due to a history of IBS. Discussed the potential need for a colonoscopy if iron deficiency persists. Continue with scheduled GI appointment.

## 2023-09-22 NOTE — Progress Notes (Signed)
[name] at [facility] HEMATOLOGY FOLLOW-UP VISIT  [name], [name], FNP  REASON FOR FOLLOW-UP: Iron deficiency anemia  ASSESSMENT & PLAN:  Patient is a 42 year old female with iron deficiency anemia referred for IV iron replacement .   Iron deficiency anemia due to chronic blood loss Improved hemoglobin from 9 to 14.4 and ferritin from 4.4 to 71 after 1g IV iron administration. Patient has not been taking oral iron due to intolerance. Discussed the need for oral iron supplementation and potential GI side effects. -Start oral iron supplementation every other day. -If constipation occurs, patient can take Miralax. -Check hemoglobin and ferritin levels in three months. -Patient has an upcoming appointment with GI due to a history of IBS. Discussed the potential need for a colonoscopy if iron deficiency persists. Continue with scheduled GI appointment.  History of tobacco abuse Patient continues to smoke, albeit less frequently. Discussed the increased risk of cancers and systemic inflammation. -Strongly advised patient to quit smoking.  Thrombocytosis Likely secondary to iron deficiency anemia -Resolved at this time   Orders Placed This Encounter  Procedures   CBC with Differential/Platelet    Standing Status:   Future    Standing Expiration Date:   09/21/2024   Comprehensive metabolic panel    Standing Status:   Future    Standing Expiration Date:   09/21/2024   Ferritin    Standing Status:   Future    Standing Expiration Date:   09/21/2024   Iron and TIBC    Standing Status:   Future    Standing Expiration Date:   09/21/2024    The total time spent in the appointment was 15 minutes encounter with patients including review of chart and various tests results, discussions about plan of care and coordination of care plan   All questions were answered. The patient knows to call the clinic with any problems, questions or concerns. No barriers to  learning was detected.  [name], MD 10/24/20249:42 AM    INTERVAL HISTORY: Allison Galloway 42 y.o. female with a history of iron deficiency anemia secondary to menorrhagia, presents for follow-up after receiving IV iron therapy. She reports variable energy levels, with some days feeling ready to go and others with no energy whatsoever. She believes the recent hysterectomy has helped a lot. She has not been taking iron pills due to previous intolerance during pregnancy, causing inability to hold anything down.  The patient also reports occasional dizziness, particularly when squatting and standing back up. She has been advised to hydrate more. She has a smoking history, currently smoking about a pack a day, which has increased from the last visit. She acknowledges the need to quit smoking but finds it challenging.  The patient also has a history of IBS and has an upcoming appointment with a GI specialist. She denies any abdominal pain, blood in stools, or black colored stools.   I have reviewed the past medical history, past surgical history, social history and family history with the patient   ALLERGIES:  has No Known Allergies.  MEDICATIONS:  Current Outpatient Medications  Medication Sig Dispense Refill   Acetaminophen Extra Strength 500 MG TABS Take 2 tablets (1,000 mg total) by mouth every 6 (six) hours as needed. 120 tablet 2   blood glucose meter kit and supplies Dispense based on patient and insurance preference. Use up to four times daily as directed. (FOR ICD-10 E10.9, E11.9). 1 each 0   busPIRone (BUSPAR) 5 MG tablet Take  5 mg by mouth 3 (three) times daily as needed (for anxiety).     cyclobenzaprine (FLEXERIL) 10 MG tablet TAKE 1 TABLET BY MOUTH TWICE DAILY AS NEEDED FOR MUSCLE SPASMS 30 tablet 1   enalapril (VASOTEC) 2.5 MG tablet Take 1 tablet (2.5 mg total) by mouth daily. 90 tablet 3   famotidine (PEPCID) 40 MG tablet Take 1 tablet (40 mg total) by mouth at bedtime. 90  tablet 0   ferrous sulfate 325 (65 FE) MG EC tablet Take 1 tablet (325 mg total) by mouth every other day. 45 tablet 3   ibuprofen (ADVIL) 800 MG tablet Take 1 tablet (800 mg total) by mouth 3 (three) times daily with meals as needed for headache, moderate pain or cramping. 60 tablet 2   linaclotide (LINZESS) 145 MCG CAPS capsule Take 1 capsule (145 mcg total) by mouth daily before breakfast. 90 capsule 3   linaclotide (LINZESS) 290 MCG CAPS capsule Take 1 capsule (290 mcg total) by mouth daily before breakfast.     metFORMIN (GLUCOPHAGE) 850 MG tablet Take 1 tablet (850 mg total) by mouth 2 (two) times daily with a meal. 180 tablet 2   norethindrone (AYGESTIN) 5 MG tablet Take 2 tablets (10 mg total) by mouth daily. 60 tablet 2   Peppermint Oil (IBGARD) 90 MG CPCR Take as directed. 16 capsule 0   polyethylene glycol powder (MIRALAX) 17 GM/SCOOP powder Take 17 g by mouth daily as needed for mild constipation. 238 g 0   No current facility-administered medications for this visit.     REVIEW OF SYSTEMS:   Constitutional: Denies fevers, chills or night sweats Eyes: Denies blurriness of vision Ears, nose, mouth, throat, and face: Denies mucositis or sore throat Respiratory: Denies cough, dyspnea or wheezes Cardiovascular: Denies palpitation, chest discomfort or lower extremity swelling Gastrointestinal:  Denies nausea, heartburn or change in bowel habits Skin: Denies abnormal skin rashes Lymphatics: Denies new lymphadenopathy or easy bruising Neurological:Denies numbness, tingling or new weaknesses Behavioral/Psych: Mood is stable, no new changes  All other systems were reviewed with the patient and are negative.  PHYSICAL EXAMINATION:   Vitals:   09/22/23 0849  BP: 129/85  Pulse: 76  Resp: 18  Temp: 98 F (36.7 C)  SpO2: 98%    GENERAL:alert, no distress and comfortable SKIN: skin color, texture, turgor are normal, no rashes or significant lesions EYES: normal, Conjunctiva are  pink and non-injected, sclera clear OROPHARYNX:no exudate, no erythema and lips, buccal mucosa, and tongue normal  NECK: supple, thyroid normal size, non-tender, without nodularity LYMPH:  no palpable lymphadenopathy in the cervical, axillary or inguinal LUNGS: clear to auscultation and percussion with normal breathing effort HEART: regular rate & rhythm and no murmurs and no lower extremity edema ABDOMEN:abdomen soft, non-tender and normal bowel sounds Musculoskeletal:no cyanosis of digits and no clubbing  NEURO: alert & oriented x 3 with fluent speech, no focal motor/sensory deficits  LABORATORY DATA:  I have reviewed the data as listed  Lab Results  Component Value Date   WBC 11.3 (H) 09/15/2023   NEUTROABS 6.8 09/15/2023   HGB 14.4 09/15/2023   HCT 44.5 09/15/2023   MCV 77.5 (L) 09/15/2023   PLT 280 09/15/2023      Component Value Date/Time   NA 137 07/27/2023 1037   NA 141 02/11/2023 0846   K 4.2 07/27/2023 1037   CL 105 07/27/2023 1037   CO2 24 07/27/2023 1037   GLUCOSE 139 (H) 07/27/2023 1037   BUN 12 07/27/2023 1037  BUN 10 02/11/2023 0846   CREATININE 0.91 07/27/2023 1037   CALCIUM 9.3 07/27/2023 1037   PROT 7.3 07/27/2023 1037   PROT 7.0 02/11/2023 0846   ALBUMIN 3.9 07/27/2023 1037   ALBUMIN 4.0 02/11/2023 0846   AST 12 07/27/2023 1037   ALT 13 07/27/2023 1037   ALKPHOS 76 07/27/2023 1037   BILITOT 0.3 07/27/2023 1037   BILITOT <0.2 02/11/2023 0846   GFRNONAA >60 07/01/2023 0847   GFRAA >60 07/11/2020 1926       Chemistry      Component Value Date/Time   NA 137 07/27/2023 1037   NA 141 02/11/2023 0846   K 4.2 07/27/2023 1037   CL 105 07/27/2023 1037   CO2 24 07/27/2023 1037   BUN 12 07/27/2023 1037   BUN 10 02/11/2023 0846   CREATININE 0.91 07/27/2023 1037      Component Value Date/Time   CALCIUM 9.3 07/27/2023 1037   ALKPHOS 76 07/27/2023 1037   AST 12 07/27/2023 1037   ALT 13 07/27/2023 1037   BILITOT 0.3 07/27/2023 1037   BILITOT <0.2  02/11/2023 0846     Lab Results  Component Value Date   IRON 19 (L) 07/27/2023   TIBC 504.0 (H) 07/27/2023   FERRITIN 71 09/15/2023

## 2023-09-22 NOTE — Assessment & Plan Note (Signed)
Likely secondary to iron deficiency anemia -Resolved at this time

## 2023-09-22 NOTE — Patient Instructions (Signed)
VISIT SUMMARY:  During today's visit, we reviewed your progress following your IV iron therapy and discussed your current symptoms and health concerns. Your hemoglobin and ferritin levels have improved significantly. We also talked about your smoking habits, occasional dizziness, and upcoming GI appointment.  YOUR PLAN:  -IRON DEFICIENCY ANEMIA: Iron deficiency anemia is a condition where your body lacks enough iron to produce healthy red blood cells. Your hemoglobin and ferritin levels have improved after IV iron therapy. We recommend starting oral iron supplementation every other day. If you experience constipation, you can take Miralax. We will recheck your hemoglobin and ferritin levels in three months.  -TOBACCO USE: Smoking increases the risk of cancers and systemic inflammation. It is very important to quit smoking. We strongly advise you to stop smoking to improve your overall health.  -GENERAL HEALTH MAINTENANCE: Please continue to monitor for any abdominal pain, blood in stools, or black colored stools. These symptoms can indicate other underlying issues that need to be addressed.  INSTRUCTIONS:  Please start taking oral iron supplementation every other day and monitor for any side effects like constipation. If constipation occurs, you can take Miralax. We will recheck your hemoglobin and ferritin levels in three months. Additionally, please increase your water intake to help with dizziness and continue with your scheduled GI appointment. It is very important to quit smoking to improve your overall health.

## 2023-09-28 DIAGNOSIS — F447 Conversion disorder with mixed symptom presentation: Secondary | ICD-10-CM | POA: Diagnosis not present

## 2023-09-30 DIAGNOSIS — Z419 Encounter for procedure for purposes other than remedying health state, unspecified: Secondary | ICD-10-CM | POA: Diagnosis not present

## 2023-10-05 DIAGNOSIS — F447 Conversion disorder with mixed symptom presentation: Secondary | ICD-10-CM | POA: Diagnosis not present

## 2023-10-12 DIAGNOSIS — F447 Conversion disorder with mixed symptom presentation: Secondary | ICD-10-CM | POA: Diagnosis not present

## 2023-10-17 ENCOUNTER — Other Ambulatory Visit (HOSPITAL_COMMUNITY): Payer: Self-pay | Admitting: Neurological Surgery

## 2023-10-17 DIAGNOSIS — D329 Benign neoplasm of meninges, unspecified: Secondary | ICD-10-CM

## 2023-10-24 ENCOUNTER — Ambulatory Visit (INDEPENDENT_AMBULATORY_CARE_PROVIDER_SITE_OTHER): Payer: BC Managed Care – PPO | Admitting: Physician Assistant

## 2023-10-24 ENCOUNTER — Encounter: Payer: Self-pay | Admitting: Physician Assistant

## 2023-10-24 VITALS — BP 120/84 | HR 100 | Ht 62.0 in | Wt 168.4 lb

## 2023-10-24 DIAGNOSIS — K5909 Other constipation: Secondary | ICD-10-CM | POA: Diagnosis not present

## 2023-10-24 DIAGNOSIS — F449 Dissociative and conversion disorder, unspecified: Secondary | ICD-10-CM | POA: Diagnosis not present

## 2023-10-24 DIAGNOSIS — D509 Iron deficiency anemia, unspecified: Secondary | ICD-10-CM | POA: Diagnosis not present

## 2023-10-24 DIAGNOSIS — R630 Anorexia: Secondary | ICD-10-CM

## 2023-10-24 MED ORDER — LINACLOTIDE 290 MCG PO CAPS: 290.0000 ug | ORAL_CAPSULE | Freq: Every day | ORAL | 2 refills | Status: DC

## 2023-10-24 NOTE — Progress Notes (Signed)
10/24/2023 Allison Galloway [id number] 1981-09-03  Referring provider: [name], FNP Primary GI doctor: Dr. Chales Galloway ( Dr. Marletta Galloway transferred)  ASSESSMENT AND PLAN:  Iron Deficiency Anemia Improved hemoglobin and ferritin levels after IV iron infusions in September. No current oral iron supplementation due to intolerance. No recent bleeding reported. -Continue current management. -Plan to recheck iron and ferritin levels in 3-6 months or sooner if symptoms of anemia or blood in stool occur.  Chronic Constipation Reports improvement with Linzess 290mg  when taken consistently, but struggles with medication adherence due to memory issues potentially related to ADHD and PTSD. -Resume Linzess 290mg  daily, with a 90-day prescription sent to [facility]. -Provide Linzess samples. -Encourage use of a pill box or other reminder system to improve medication adherence. -Advise continued use of a squatty potty and pelvic floor exercises.  Unintentional Weight Loss Reports weight loss from 180s to 160s since hysterectomy. No clear cause identified at this time. -Monitor weight. If continued unexplained weight loss, consider further evaluation  Conversion disorder Previous unremarkable workup with multiple specialties for multitude of symptoms in the past.   Patient Care Team: Allison Masters, FNP as PCP - General (Family Medicine) Allison Constant, MD as PCP - Cardiology (Cardiology) Allison Rockers, NP as Nurse Practitioner (Radiology) Allison Copas, MD as Referring Physician (Optometry)  HISTORY OF PRESENT ILLNESS: 42 y.o. female with a past medical history of chronic constiapation, anxiety/depression, and others listed below presents for evaluation of IBS-C.   Notes reviewed from [facility]. Patient's had chronic constipation trial Amitiza 24 mcg twice daily with some benefit. Patient has left facial weakness/twitching and left leg weakness,  stuttering of speech.  Has had unremarkable workup with pulmonary, cardiology, neurosurgery and neurology.  Incidental small 5 mm in right mid frontal meningioma but otherwise very thorough workup without known cause.  Ultimately felt she has conversion disorder due to anxiety stress. Patient is no longer able to work or drive. 04/07/2022 office visit with Allison Galloway at [facility], given Trulance 10/29/2022 labs reviewed show no leukocytosis, no anemia normal kidney liver, did show diabetes. 10/20/22 positive cannabinoid drug screen  02/11/2023 patient seen in the office with myself for IBS constipation. That visit patient was having abdominal discomfort, bloating no alarm symptoms given trial of Linzess and discussed following up with GYN for possible pelvic floor component. 05/06/2023 CT abdomen pelvis with contrast for vaginal bleeding and pelvic pain showed prominent hypodensity along endometrium cervix and extending vagina and total density most likely blood products, substantial thickening lower uterine segment endometrial contents containing long very thin uterus C-section scar no rupture of the scar adjacent bladder walls immediately abutting the anterior uterus urinary wall thickening of urinary bladder nondistention or cystitis, sclerosis and sacroiliac joints pubis sacroiliitis potential with mild bursitis pubis 07/05/2023 patient had robotic assisted total laparoscopic hysterectomy bilateral salpingectomy, lysis of adhesions, cystoscopy and laparoscopic TEP block for dysmenorrhea and pelvic pain 07/27/2023 office visit with myself for IDA, suspicious for menorrhagia, status post hysterectomy if patient continues to have IDA will plan EGD colonoscopy. Patient also given Linzess samples, pelvic floor physical therapy encourage 09/22/2023 office visit with oncology for IDA, has had IV iron administration in Sept.  Hemoglobin improved from 9-14.4 and ferritin from 4.4-71  Discussed the use of AI scribe  software for clinical note transcription with the patient, who gave verbal consent to proceed.  History of Present Illness   The patient, with a history of hysterectomy due to  heavy bleeding suspected to be from Allison Galloway, presents with ongoing constipation. She reports that she had started pelvic floor physical therapy but discontinued due to insurance issues. She also had a follow-up with hematology in October, where her hemoglobin was 14 and ferritin was 71, following two IV iron infusions in September. She does not tolerate oral iron supplements and has not taken any since the infusions.  The patient was prescribed Linzess for constipation, which she initially stopped due to an adverse reaction with the iron infusion. She reports that when taken consistently, Linzess does improve her bowel movements and reduces discomfort and bloating. However, she has had issues remembering to take her medication regularly. She attributes this to potential ADHD and PTSD, which she is currently addressing with a therapist and psychiatrist.  The patient also reports some weight loss since her hysterectomy, dropping from the 180s to consistently around 168-169. She notes that she often only eats once a day, not due to feeling full but rather a lack of hunger. She occasionally uses marijuana to stimulate her appetite and calm her symptoms.      She  reports that she quit smoking about 2 years ago. Her smoking use included cigarettes. She has never used smokeless tobacco. She reports current drug use. Drug: Marijuana. She reports that she does not drink alcohol.  RELEVANT LABS AND IMAGING: CBC    Component Value Date/Time   WBC 11.3 (H) 09/15/2023 1025   RBC 5.74 (H) 09/15/2023 1025   HGB 14.4 09/15/2023 1025   HGB 13.3 10/29/2022 1215   HCT 44.5 09/15/2023 1025   HCT 40.0 10/29/2022 1215   PLT 280 09/15/2023 1025   PLT 245 10/29/2022 1215   MCV 77.5 (L) 09/15/2023 1025   MCV 82 10/29/2022 1215   MCH 25.1  (L) 09/15/2023 1025   MCHC 32.4 09/15/2023 1025   RDW Not Measured 09/15/2023 1025   RDW 14.1 10/29/2022 1215   LYMPHSABS 3.5 09/15/2023 1025   LYMPHSABS 1.9 10/29/2022 1215   MONOABS 0.6 09/15/2023 1025   EOSABS 0.3 09/15/2023 1025   EOSABS 0.1 10/29/2022 1215   BASOSABS 0.0 09/15/2023 1025   BASOSABS 0.0 10/29/2022 1215   Recent Labs    10/29/22 1215 04/07/23 1122 04/07/23 1250 04/13/23 1252 04/20/23 1631 05/06/23 1435 05/06/23 1452 07/01/23 0847 07/27/23 1037 09/15/23 1025  HGB 13.3 9.5* 9.3* 10.2* 10.4* 10.4* 11.2* 8.5* 9.0* 14.4     CMP     Component Value Date/Time   NA 137 07/27/2023 1037   NA 141 02/11/2023 0846   K 4.2 07/27/2023 1037   CL 105 07/27/2023 1037   CO2 24 07/27/2023 1037   GLUCOSE 139 (H) 07/27/2023 1037   BUN 12 07/27/2023 1037   BUN 10 02/11/2023 0846   CREATININE 0.91 07/27/2023 1037   CALCIUM 9.3 07/27/2023 1037   PROT 7.3 07/27/2023 1037   PROT 7.0 02/11/2023 0846   ALBUMIN 3.9 07/27/2023 1037   ALBUMIN 4.0 02/11/2023 0846   AST 12 07/27/2023 1037   ALT 13 07/27/2023 1037   ALKPHOS 76 07/27/2023 1037   BILITOT 0.3 07/27/2023 1037   BILITOT <0.2 02/11/2023 0846   GFRNONAA >60 07/01/2023 0847   GFRAA >60 07/11/2020 1926      Latest Ref Rng & Units 07/27/2023   10:37 AM 05/06/2023    2:35 PM 04/20/2023    4:31 PM  Hepatic Function  Total Protein 6.0 - 8.3 g/dL 7.3  8.3  8.2   Albumin 3.5 - 5.2 g/dL 3.9  4.1  4.3   AST 0 - 37 U/L 12  19  16    ALT 0 - 35 U/L 13  19  18    Alk Phosphatase 39 - 117 U/L 76  56  52   Total Bilirubin 0.2 - 1.2 mg/dL 0.3  0.3  0.8       Current Medications:   Current Outpatient Medications (Endocrine & Metabolic):    metFORMIN (GLUCOPHAGE) 850 MG tablet, Take 1 tablet (850 mg total) by mouth 2 (two) times daily with a meal.   norethindrone (AYGESTIN) 5 MG tablet, Take 2 tablets (10 mg total) by mouth daily.  Current Outpatient Medications (Cardiovascular):    enalapril (VASOTEC) 2.5 MG tablet, Take  1 tablet (2.5 mg total) by mouth daily.   Current Outpatient Medications (Analgesics):    Acetaminophen Extra Strength 500 MG TABS, Take 2 tablets (1,000 mg total) by mouth every 6 (six) hours as needed.   ibuprofen (ADVIL) 800 MG tablet, Take 1 tablet (800 mg total) by mouth 3 (three) times daily with meals as needed for headache, moderate pain or cramping.  Current Outpatient Medications (Hematological):    ferrous sulfate 325 (65 FE) MG EC tablet, Take 1 tablet (325 mg total) by mouth every other day.  Current Outpatient Medications (Other):    blood glucose meter kit and supplies, Dispense based on patient and insurance preference. Use up to four times daily as directed. (FOR ICD-10 E10.9, E11.9).   busPIRone (BUSPAR) 5 MG tablet, Take 5 mg by mouth 3 (three) times daily as needed (for anxiety).   cyclobenzaprine (FLEXERIL) 10 MG tablet, TAKE 1 TABLET BY MOUTH TWICE DAILY AS NEEDED FOR MUSCLE SPASMS   famotidine (PEPCID) 40 MG tablet, Take 1 tablet (40 mg total) by mouth at bedtime.   Peppermint Oil (IBGARD) 90 MG CPCR, Take as directed.   polyethylene glycol powder (MIRALAX) 17 GM/SCOOP powder, Take 17 g by mouth daily as needed for mild constipation.   linaclotide (LINZESS) 290 MCG CAPS capsule, Take 1 capsule (290 mcg total) by mouth daily before breakfast.  Medical History:  Past Medical History:  Diagnosis Date   Abnormal vaginal bleeding 2024   Anxiety    Follows w/ PCP   Arthritis    left knee   Chronic low back pain with bilateral sciatica    Constipation    Follows with [facility] for chronic constipation.   Conversion disorder    speech difficulty, tremors, weakness, negative cardiac, pulmonary & neuro imaging workup. Patient states all symptoms were thought to be related to conversion disorder.   Depression    Follows w/ PCP.   Facial weakness 2023   left-sided facial weakiness, speech difficulty w/ stuttering, left-sided weakness / Saw neurologist,Dr. Darvin Neighbours  on 03/15/22 in Epic.   Headache    hx of migraines   SOB (shortness of breath)    Former smoker. Follows w/ Dr. Francine Graven pulmonology and Dr. Edwyna Perfect, cardiology. 03/03/22 Echo within normal limits, EF 65 -70%. Patient states SOB r/t panic attacks.   Type 2 diabetes mellitus (HCC)    Follows w/ PCP, Gilford Silvius, FNP.   Wears glasses    Allergies: No Known Allergies   Surgical History:  She  has a past surgical history that includes Cesarean section; Tubal ligation (2006); Knee surgery (Left); Robotic assisted total hysterectomy with bilateral salpingo oophorectomy (Bilateral, 07/05/2023); Cystoscopy (N/A, 07/05/2023); and Abdominal hysterectomy. Family History:  Her family history includes Anxiety disorder in an other family member; Arthritis in  her mother; Asthma in her mother and sister; Bipolar disorder in an other family member; Depression in her mother, sister, and another family member; Diabetes in her mother; Hypertension in an other family member; Learning disabilities in her mother; Mental illness in her mother and sister; Seizures in her daughter.  REVIEW OF SYSTEMS  : All other systems reviewed and negative except where noted in the History of Present Illness.  PHYSICAL EXAM: BP 120/84 (BP Location: Left Arm, Patient Position: Sitting, Cuff Size: Normal)   Pulse 100   Ht 5\' 2"  (1.575 m)   Wt 168 lb 6 oz (76.4 kg)   LMP 10/02/2022 (Approximate) Comment: Patient has had irregular off and on bleeding in 2024.  SpO2 99%   BMI 30.80 kg/m  General Appearance: Well nourished, in no apparent distress. Head:   Normocephalic and atraumatic. Eyes:  sclerae anicteric,conjunctive pink  Respiratory: Respiratory effort normal, BS equal bilaterally without rales, rhonchi, wheezing. Cardio: RRR with no MRGs. Peripheral pulses intact.  Abdomen: Soft,  Obese , hypoactive bowel sounds. mild tenderness lower AB.  Without guarding and Without rebound. No masses. Rectal: Not  evaluated Musculoskeletal: Full ROM, Normal gait. Without edema. Skin:  Dry and intact without significant lesions or rashes Neuro: Alert and  oriented x4;  Has some stuttering but no focal deficits Psych:  Cooperative. Normal mood and affect.    Doree Albee, Allison-C 10:33 AM

## 2023-10-24 NOTE — Patient Instructions (Addendum)
_______________________________________________________  If your blood pressure at your visit was 140/90 or greater, please contact your primary care physician to follow up on this. _______________________________________________________  If you are age 43 or older, your body mass index should be between 23-30. Your Body mass index is 30.8 kg/m. If this is out of the aforementioned range listed, please consider follow up with your Primary Care Provider.  If you are age 35 or younger, your body mass index should be between 19-25. Your Body mass index is 30.8 kg/m. If this is out of the aformentioned range listed, please consider follow up with your Primary Care Provider.  ________________________________________________________  The [facility] providers would like to encourage you to use [facility] to communicate with providers for non-urgent requests or questions.  Due to long hold times on the telephone, sending your provider a message by [facility] may be a faster and more efficient way to get a response.  Please allow 48 business hours for a response.  Please remember that this is for non-urgent requests.  _______________________________________________________   Linzess 290 mcg- try the pill box *IBS-C patients may begin to experience relief from belly pain and overall abdominal symptoms (pain, discomfort, and bloating) in about 1 week,  with symptoms typically improving over 12 weeks.  Take at least 30 minutes before the first meal of the day on an empty stomach You can have a loose stool if you eat a high-fat breakfast. Give it at least 7 days, may have more bowel movements during that time.   The diarrhea should go away and you should start having normal, complete, full bowel movements.  It may be helpful to start treatment when you can be near the comfort of your own bathroom, such as a weekend.  After you are out we can send in a prescription if you did well, there is a prescription  card  Toileting tips to help with your constipation - Drink at least 64-80 ounces of water/liquid per day. - Establish a time to try to move your bowels every day.  For many people, this is after a cup of coffee or after a meal such as breakfast. - Sit all of the way back on the toilet keeping your back fairly straight and while sitting up, try to rest the tops of your forearms on your upper thighs.   - Raising your feet with a step stool/squatty potty can be helpful to improve the angle that allows your stool to pass through the rectum. - Relax the rectum feeling it bulge toward the toilet water.  If you feel your rectum raising toward your body, you are contracting rather than relaxing. - Breathe in and slowly exhale. "Belly breath" by expanding your belly towards your belly button. Keep belly expanded as you gently direct pressure down and back to the anus.  A low pitched GRRR sound can assist with increasing intra-abdominal pressure.  (Can also trying to blow on a pinwheel and make it move, this helps with the same belly breathing) - Repeat 3-4 times. If unsuccessful, contract the pelvic floor to restore normal tone and get off the toilet.  Avoid excessive straining. - To reduce excessive wiping by teaching your anus to normally contract, place hands on outer aspect of knees and resist knee movement outward.  Hold 5-10 second then place hands just inside of knees and resist inward movement of knees.  Hold 5 seconds.  Repeat a few times each way.  Go to the ER if unable to  pass gas, severe AB pain, unable to hold down food, any shortness of breath of chest pain.  Thank you for entrusting me with your care and choosing [facility].  Quentin Mulling, PA-C

## 2023-10-30 DIAGNOSIS — Z419 Encounter for procedure for purposes other than remedying health state, unspecified: Secondary | ICD-10-CM | POA: Diagnosis not present

## 2023-11-02 ENCOUNTER — Encounter: Payer: BC Managed Care – PPO | Admitting: Family Medicine

## 2023-11-04 ENCOUNTER — Encounter: Payer: BC Managed Care – PPO | Admitting: Family Medicine

## 2023-11-09 ENCOUNTER — Encounter: Payer: Self-pay | Admitting: Family Medicine

## 2023-11-09 ENCOUNTER — Encounter: Payer: BC Managed Care – PPO | Admitting: Family Medicine

## 2023-11-09 DIAGNOSIS — F447 Conversion disorder with mixed symptom presentation: Secondary | ICD-10-CM | POA: Diagnosis not present

## 2023-11-10 ENCOUNTER — Ambulatory Visit: Payer: BC Managed Care – PPO

## 2023-11-10 ENCOUNTER — Encounter: Payer: Self-pay | Admitting: Family Medicine

## 2023-11-10 VITALS — BP 143/91 | HR 87 | Temp 97.9°F | Ht 62.0 in | Wt 166.6 lb

## 2023-11-10 DIAGNOSIS — H7291 Unspecified perforation of tympanic membrane, right ear: Secondary | ICD-10-CM

## 2023-11-10 DIAGNOSIS — H66013 Acute suppurative otitis media with spontaneous rupture of ear drum, bilateral: Secondary | ICD-10-CM | POA: Diagnosis not present

## 2023-11-10 MED ORDER — CEFDINIR 300 MG PO CAPS: 300.0000 mg | ORAL_CAPSULE | Freq: Two times a day (BID) | ORAL | 0 refills | Status: DC

## 2023-11-10 NOTE — Progress Notes (Signed)
Subjective:  Patient ID: Allison Galloway, female    DOB: March 17, 1981, 42 y.o.   MRN: [id number]  Patient Care Team: [name], FNP as PCP - General (Family Medicine) Christell Constant, MD as PCP - Cardiology (Cardiology) Tanda Rockers, NP as Nurse Practitioner (Radiology) Michaelle Copas, MD as Referring Physician (Optometry)   Chief Complaint:  [facility] follow up (12/8- ruptured right ear drum )   HPI: Allison Galloway is a 42 y.o. female presenting on 11/10/2023 for [facility] follow up (12/8- ruptured right ear drum )   Discussed the use of AI scribe software for clinical note transcription with the patient, who gave verbal consent to proceed.  History of Present Illness   The patient, with a history of recurrent ear infections and previous ear tube placement in childhood, presented with a recent episode of a ruptured eardrum. The rupture occurred following a trip to [place], during which they experienced significant pressure changes. The patient reported initial symptoms of balance and sinus issues, which escalated to severe right ear pain and swelling around the ear. The following morning, they noticed blood coming from the ear, which prompted a visit to urgent care.  At urgent care, the patient was prescribed augmentin and an eardrop(unsure of drop and does not have with her, not available in EHR). Despite the treatment, the patient reported that the ear still felt full. The patient expressed dissatisfaction with the effectiveness of amoxicillin in the past.          Relevant past medical, surgical, family, and social history reviewed and updated as indicated.  Allergies and medications reviewed and updated. Data reviewed: Chart in Epic.   Past Medical History:  Diagnosis Date   Abnormal vaginal bleeding 2024   Anxiety    Follows w/ PCP   Arthritis    left knee   Chronic low back pain with bilateral sciatica    Constipation     Follows with [facility] for chronic constipation.   Conversion disorder    speech difficulty, tremors, weakness, negative cardiac, pulmonary & neuro imaging workup. Patient states all symptoms were thought to be related to conversion disorder.   Depression    Follows w/ PCP.   Facial weakness 2023   left-sided facial weakiness, speech difficulty w/ stuttering, left-sided weakness / Saw neurologist,Dr. Darvin Neighbours on 03/15/22 in Epic.   Headache    hx of migraines   SOB (shortness of breath)    Former smoker. Follows w/ Dr. Francine Graven pulmonology and Dr. Edwyna Perfect, cardiology. 03/03/22 Echo within normal limits, EF 65 -70%. Patient states SOB r/t panic attacks.   Type 2 diabetes mellitus (HCC)    Follows w/ PCP, Gilford Silvius, FNP.   Wears glasses     Past Surgical History:  Procedure Laterality Date   ABDOMINAL HYSTERECTOMY     CESAREAN SECTION     3   CYSTOSCOPY N/A 07/05/2023   Procedure: CYSTOSCOPY;  Surgeon: Lorriane Shire, MD;  Location: [facility];  Service: Gynecology;  Laterality: N/A;   KNEE SURGERY Left    x 3   ROBOTIC ASSISTED TOTAL HYSTERECTOMY WITH BILATERAL SALPINGO OOPHERECTOMY Bilateral 07/05/2023   Procedure: XI ROBOTIC ASSISTED TOTAL LAPRASCOPIC HYSTERECTOMY WITH BILATERAL SALPINGECTOMY,LYSIS OF ACHESIONS, TAP BLOCK;  Surgeon: Lorriane Shire, MD;  Location: [facility];  Service: Gynecology;  Laterality: Bilateral;   TUBAL LIGATION  2006    Social History   Socioeconomic History  Marital status: Married    Spouse name: Not on file   Number of children: Not on file   Years of education: Not on file   Highest education level: Not on file  Occupational History   Not on file  Tobacco Use   Smoking status: Former    Current packs/day: 0.00    Types: Cigarettes    Quit date: 08/2021    Years since quitting: 2.2   Smokeless tobacco: Never  Vaping Use   Vaping status: Former   Substances: Nicotine, Flavoring   Substance and Sexual Activity   Alcohol use: No   Drug use: Yes    Types: Marijuana    Comment: use sometimes   Sexual activity: Yes    Partners: Male    Birth control/protection: Surgical    Comment: tubal, menarche 42yo, sexual debut 42yo  Other Topics Concern   Not on file  Social History Narrative   Not on file   Social Drivers of Health   Financial Resource Strain: Not on file  Food Insecurity: No Food Insecurity (08/12/2023)   Hunger Vital Sign    Worried About Running Out of Food in the Last Year: Never true    Ran Out of Food in the Last Year: Never true  Transportation Needs: No Transportation Needs (08/12/2023)   PRAPARE - Administrator, Civil Service (Medical): No    Lack of Transportation (Non-Medical): No  Physical Activity: Not on file  Stress: Not on file  Social Connections: Not on file  Intimate Partner Violence: Not At Risk (08/12/2023)   Humiliation, Afraid, Rape, and Kick questionnaire    Fear of Current or Ex-Partner: No    Emotionally Abused: No    Physically Abused: No    Sexually Abused: No    Outpatient Encounter Medications as of 11/10/2023  Medication Sig   Acetaminophen Extra Strength 500 MG TABS Take 2 tablets (1,000 mg total) by mouth every 6 (six) hours as needed.   blood glucose meter kit and supplies Dispense based on patient and insurance preference. Use up to four times daily as directed. (FOR ICD-10 E10.9, E11.9).   busPIRone (BUSPAR) 5 MG tablet Take 5 mg by mouth 3 (three) times daily as needed (for anxiety).   cefdinir (OMNICEF) 300 MG capsule Take 1 capsule (300 mg total) by mouth 2 (two) times daily. 1 po BID   cyclobenzaprine (FLEXERIL) 10 MG tablet TAKE 1 TABLET BY MOUTH TWICE DAILY AS NEEDED FOR MUSCLE SPASMS   enalapril (VASOTEC) 2.5 MG tablet Take 1 tablet (2.5 mg total) by mouth daily.   famotidine (PEPCID) 40 MG tablet Take 1 tablet (40 mg total) by mouth at bedtime.   ferrous sulfate 325 (65 FE) MG EC tablet Take  1 tablet (325 mg total) by mouth every other day.   ibuprofen (ADVIL) 800 MG tablet Take 1 tablet (800 mg total) by mouth 3 (three) times daily with meals as needed for headache, moderate pain or cramping.   linaclotide (LINZESS) 290 MCG CAPS capsule Take 1 capsule (290 mcg total) by mouth daily before breakfast.   metFORMIN (GLUCOPHAGE) 850 MG tablet Take 1 tablet (850 mg total) by mouth 2 (two) times daily with a meal.   norethindrone (AYGESTIN) 5 MG tablet Take 2 tablets (10 mg total) by mouth daily.   ofloxacin (FLOXIN) 0.3 % OTIC solution 5 drops 2 (two) times daily.   Peppermint Oil (IBGARD) 90 MG CPCR Take as directed.   polyethylene glycol powder (MIRALAX)  17 GM/SCOOP powder Take 17 g by mouth daily as needed for mild constipation.   [DISCONTINUED] amoxicillin-clavulanate (AUGMENTIN) 875-125 MG tablet SMARTSIG:1 Tablet(s) By Mouth Every 12 Hours   No facility-administered encounter medications on file as of 11/10/2023.    No Known Allergies  Pertinent ROS per HPI, otherwise unremarkable      Objective:  BP (!) 143/91   Pulse 87   Temp 97.9 F (36.6 C)   Ht 5\' 2"  (1.575 m)   Wt 166 lb 9.6 oz (75.6 kg)   LMP 10/02/2022 (Approximate) Comment: Patient has had irregular off and on bleeding in 2024.  SpO2 100%   BMI 30.47 kg/m    Wt Readings from Last 3 Encounters:  11/10/23 166 lb 9.6 oz (75.6 kg)  10/24/23 168 lb 6 oz (76.4 kg)  09/22/23 169 lb 12.8 oz (77 kg)    Physical Exam Vitals and nursing note reviewed.  Constitutional:      General: She is not in acute distress.    Appearance: Normal appearance. She is well-developed and well-groomed. She is not ill-appearing, toxic-appearing or diaphoretic.  HENT:     Head: Normocephalic and atraumatic.     Jaw: There is normal jaw occlusion.     Right Ear: Hearing normal. Tenderness present. Tympanic membrane is perforated and erythematous.     Left Ear: Hearing normal. Tympanic membrane is erythematous and bulging.      Nose: Nose normal.     Mouth/Throat:     Lips: Pink.     Mouth: Mucous membranes are moist.     Pharynx: Oropharynx is clear. Uvula midline.  Eyes:     General: Lids are normal.     Conjunctiva/sclera: Conjunctivae normal.     Pupils: Pupils are equal, round, and reactive to light.  Neck:     Trachea: Trachea and phonation normal.  Cardiovascular:     Rate and Rhythm: Normal rate and regular rhythm.     Chest Wall: PMI is not displaced.     Pulses: Normal pulses.     Heart sounds: Normal heart sounds. No murmur heard.    No friction rub. No gallop.  Pulmonary:     Effort: Pulmonary effort is normal. No respiratory distress.     Breath sounds: Normal breath sounds. No wheezing.  Musculoskeletal:     Cervical back: Neck supple.     Right lower leg: No edema.     Left lower leg: No edema.  Skin:    General: Skin is warm and dry.     Capillary Refill: Capillary refill takes less than 2 seconds.     Coloration: Skin is not cyanotic, jaundiced or pale.     Findings: No rash.  Neurological:     General: No focal deficit present.     Mental Status: She is alert and oriented to person, place, and time.     Sensory: Sensation is intact.     Motor: Motor function is intact.     Coordination: Coordination is intact.     Gait: Gait is intact.     Deep Tendon Reflexes: Reflexes are normal and symmetric.  Psychiatric:        Attention and Perception: Attention and perception normal.        Mood and Affect: Mood and affect normal.        Speech: Speech normal.        Behavior: Behavior normal. Behavior is cooperative.        Thought Content: Thought  content normal.        Cognition and Memory: Cognition and memory normal.        Judgment: Judgment normal.      Results for orders placed or performed in visit on 09/15/23  CBC with Differential/Platelet   Collection Time: 09/15/23 10:25 AM  Result Value Ref Range   WBC 11.3 (H) 4.0 - 10.5 K/uL   RBC 5.74 (H) 3.87 - 5.11 MIL/uL    Hemoglobin 14.4 12.0 - 15.0 g/dL   HCT 40.9 81.1 - 91.4 %   MCV 77.5 (L) 80.0 - 100.0 fL   MCH 25.1 (L) 26.0 - 34.0 pg   MCHC 32.4 30.0 - 36.0 g/dL   RDW Not Measured 78.2 - 15.5 %   Platelets 280 150 - 400 K/uL   nRBC 0.0 0.0 - 0.2 %   Neutrophils Relative % 61 %   Neutro Abs 6.8 1.7 - 7.7 K/uL   Lymphocytes Relative 31 %   Lymphs Abs 3.5 0.7 - 4.0 K/uL   Monocytes Relative 5 %   Monocytes Absolute 0.6 0.1 - 1.0 K/uL   Eosinophils Relative 3 %   Eosinophils Absolute 0.3 0.0 - 0.5 K/uL   Basophils Relative 0 %   Basophils Absolute 0.0 0.0 - 0.1 K/uL   WBC Morphology See Note    RBC Morphology See Note    Smear Review MORPHOLOGY UNREMARKABLE    Immature Granulocytes 0 %   Abs Immature Granulocytes 0.04 0.00 - 0.07 K/uL   Polychromasia PRESENT   Vitamin B12   Collection Time: 09/15/23 10:25 AM  Result Value Ref Range   Vitamin B-12 165 (L) 180 - 914 pg/mL  Folate   Collection Time: 09/15/23 10:25 AM  Result Value Ref Range   Folate 6.4 >5.9 ng/mL  Ferritin   Collection Time: 09/15/23 10:25 AM  Result Value Ref Range   Ferritin 71 11 - 307 ng/mL       Pertinent labs & imaging results that were available during my care of the patient were reviewed by me and considered in my medical decision making.  Assessment & Plan:  Jennesis was seen today for [facility].  Diagnoses and all orders for this visit:  Perforation of right tympanic membrane Non-recurrent acute suppurative otitis media of both ears with spontaneous rupture of tympanic membranes -     cefdinir (OMNICEF) 300 MG capsule; Take 1 capsule (300 mg total) by mouth 2 (two) times daily. 1 po BID     Assessment and Plan    Ruptured Eardrum Ruptured eardrum in the right ear, likely secondary to pressure changes during travel. Reports fullness, pain, swelling, and blood discharge. Initially treated with amoxicillin and eardrops without significant improvement. Discussed potential for  spontaneous healing within weeks but may require ENT referral if not. - Discontinue amoxicillin - Prescribe Cefdinir 300 mg twice a day for 10 days - Continue eardrops - Follow up in one week to assess progress - Refer to ENT if symptoms persist after completion of antibiotics  Ear Infection Significant ear infection in the right ear with persistent perforation. History of ear infections and tympanostomy tubes as a child. Discussed necessity of ENT referral if symptoms persist post-antibiotic regimen. - Continue eardrops - Monitor for improvement with new antibiotic regimen - Follow up in one week to assess progress - Refer to ENT if symptoms persist after completion of antibiotics.        Continue all other maintenance medications.  Follow up  plan: Return if symptoms worsen or fail to improve.   Continue healthy lifestyle choices, including diet (rich in fruits, vegetables, and lean proteins, and low in salt and simple carbohydrates) and exercise (at least 30 minutes of moderate physical activity daily).  Educational handout given for eardrum rupture  The above assessment and management plan was discussed with the patient. The patient verbalized understanding of and has agreed to the management plan. Patient is aware to call the clinic if they develop any new symptoms or if symptoms persist or worsen. Patient is aware when to return to the clinic for a follow-up visit. Patient educated on when it is appropriate to go to the emergency department.   Kari Baars, FNP-C [facility] [phone]

## 2023-11-13 NOTE — Progress Notes (Signed)
Agree with assessment/plan.  Raj Florestine Carmical, MD [facility] [phone]  

## 2023-11-17 DIAGNOSIS — F447 Conversion disorder with mixed symptom presentation: Secondary | ICD-10-CM | POA: Diagnosis not present

## 2023-11-21 ENCOUNTER — Ambulatory Visit (HOSPITAL_COMMUNITY)
Admission: RE | Admit: 2023-11-21 | Discharge: 2023-11-21 | Disposition: A | Discharge status: Home / Self Care | Payer: BC Managed Care – PPO | Source: Ambulatory Visit | Attending: Neurological Surgery | Admitting: Neurological Surgery

## 2023-11-21 DIAGNOSIS — D320 Benign neoplasm of cerebral meninges: Secondary | ICD-10-CM | POA: Diagnosis not present

## 2023-11-21 DIAGNOSIS — F447 Conversion disorder with mixed symptom presentation: Secondary | ICD-10-CM | POA: Diagnosis not present

## 2023-11-21 DIAGNOSIS — D329 Benign neoplasm of meninges, unspecified: Secondary | ICD-10-CM | POA: Insufficient documentation

## 2023-11-21 MED ORDER — GADOBUTROL 1 MMOL/ML IV SOLN
7.0000 mL | Freq: Once | INTRAVENOUS | Status: AC | PRN
  Administered 2023-11-21: 7 mL via INTRAVENOUS

## 2023-11-29 DIAGNOSIS — F447 Conversion disorder with mixed symptom presentation: Secondary | ICD-10-CM | POA: Diagnosis not present

## 2023-11-30 DIAGNOSIS — Z419 Encounter for procedure for purposes other than remedying health state, unspecified: Secondary | ICD-10-CM | POA: Diagnosis not present

## 2023-12-07 DIAGNOSIS — F447 Conversion disorder with mixed symptom presentation: Secondary | ICD-10-CM | POA: Diagnosis not present

## 2023-12-14 DIAGNOSIS — F447 Conversion disorder with mixed symptom presentation: Secondary | ICD-10-CM | POA: Diagnosis not present

## 2023-12-16 ENCOUNTER — Inpatient Hospital Stay: Payer: BC Managed Care – PPO

## 2023-12-19 ENCOUNTER — Inpatient Hospital Stay: Payer: BC Managed Care – PPO | Attending: Oncology

## 2023-12-23 ENCOUNTER — Inpatient Hospital Stay: Payer: BC Managed Care – PPO | Admitting: Oncology

## 2023-12-28 DIAGNOSIS — F447 Conversion disorder with mixed symptom presentation: Secondary | ICD-10-CM | POA: Diagnosis not present

## 2023-12-31 DIAGNOSIS — Z419 Encounter for procedure for purposes other than remedying health state, unspecified: Secondary | ICD-10-CM | POA: Diagnosis not present

## 2024-01-04 DIAGNOSIS — F447 Conversion disorder with mixed symptom presentation: Secondary | ICD-10-CM | POA: Diagnosis not present

## 2024-01-05 DIAGNOSIS — D329 Benign neoplasm of meninges, unspecified: Secondary | ICD-10-CM | POA: Diagnosis not present

## 2024-01-11 DIAGNOSIS — F447 Conversion disorder with mixed symptom presentation: Secondary | ICD-10-CM | POA: Diagnosis not present

## 2024-01-17 ENCOUNTER — Other Ambulatory Visit: Payer: Self-pay | Admitting: Family Medicine

## 2024-01-17 DIAGNOSIS — E119 Type 2 diabetes mellitus without complications: Secondary | ICD-10-CM

## 2024-01-18 DIAGNOSIS — F447 Conversion disorder with mixed symptom presentation: Secondary | ICD-10-CM | POA: Diagnosis not present

## 2024-01-20 ENCOUNTER — Encounter: Payer: Self-pay | Admitting: Family Medicine

## 2024-01-20 ENCOUNTER — Encounter: Payer: Self-pay | Admitting: Oncology

## 2024-01-20 ENCOUNTER — Ambulatory Visit: Payer: Medicaid Other | Admitting: Family Medicine

## 2024-01-20 VITALS — BP 141/93 | HR 95 | Temp 98.1°F | Ht 62.0 in | Wt 171.6 lb

## 2024-01-20 DIAGNOSIS — Z90710 Acquired absence of both cervix and uterus: Secondary | ICD-10-CM | POA: Diagnosis not present

## 2024-01-20 DIAGNOSIS — F411 Generalized anxiety disorder: Secondary | ICD-10-CM | POA: Diagnosis not present

## 2024-01-20 DIAGNOSIS — D500 Iron deficiency anemia secondary to blood loss (chronic): Secondary | ICD-10-CM

## 2024-01-20 DIAGNOSIS — Z7984 Long term (current) use of oral hypoglycemic drugs: Secondary | ICD-10-CM | POA: Diagnosis not present

## 2024-01-20 DIAGNOSIS — F4310 Post-traumatic stress disorder, unspecified: Secondary | ICD-10-CM | POA: Insufficient documentation

## 2024-01-20 DIAGNOSIS — F449 Dissociative and conversion disorder, unspecified: Secondary | ICD-10-CM | POA: Diagnosis not present

## 2024-01-20 DIAGNOSIS — F331 Major depressive disorder, recurrent, moderate: Secondary | ICD-10-CM | POA: Diagnosis not present

## 2024-01-20 DIAGNOSIS — E119 Type 2 diabetes mellitus without complications: Secondary | ICD-10-CM

## 2024-01-20 LAB — BAYER DCA HB A1C WAIVED: HB A1C (BAYER DCA - WAIVED): 6.3 % — ABNORMAL HIGH (ref 4.8–5.6)

## 2024-01-20 MED ORDER — METFORMIN HCL 850 MG PO TABS: 850.0000 mg | ORAL_TABLET | Freq: Two times a day (BID) | ORAL | 2 refills | Status: DC

## 2024-01-20 NOTE — Patient Instructions (Signed)
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
## 2024-01-21 LAB — CBC WITH DIFFERENTIAL/PLATELET
Basophils Absolute: 0.1 10*3/uL (ref 0.0–0.2)
Basos: 0 %
EOS (ABSOLUTE): 0.2 10*3/uL (ref 0.0–0.4)
Eos: 2 %
Hematocrit: 45.7 % (ref 34.0–46.6)
Hemoglobin: 15.1 g/dL (ref 11.1–15.9)
Immature Grans (Abs): 0 10*3/uL (ref 0.0–0.1)
Immature Granulocytes: 0 %
Lymphocytes Absolute: 3.4 10*3/uL — ABNORMAL HIGH (ref 0.7–3.1)
Lymphs: 25 %
MCH: 29.4 pg (ref 26.6–33.0)
MCHC: 33 g/dL (ref 31.5–35.7)
MCV: 89 fL (ref 79–97)
Monocytes Absolute: 0.7 10*3/uL (ref 0.1–0.9)
Monocytes: 5 %
Neutrophils Absolute: 9.1 10*3/uL — ABNORMAL HIGH (ref 1.4–7.0)
Neutrophils: 68 %
Platelets: 280 10*3/uL (ref 150–450)
RBC: 5.13 x10E6/uL (ref 3.77–5.28)
RDW: 13.2 % (ref 11.7–15.4)
WBC: 13.5 10*3/uL — ABNORMAL HIGH (ref 3.4–10.8)

## 2024-01-21 LAB — VITAMIN B12: Vitamin B-12: 233 pg/mL (ref 232–1245)

## 2024-01-25 DIAGNOSIS — F447 Conversion disorder with mixed symptom presentation: Secondary | ICD-10-CM | POA: Diagnosis not present

## 2024-01-28 DIAGNOSIS — Z419 Encounter for procedure for purposes other than remedying health state, unspecified: Secondary | ICD-10-CM | POA: Diagnosis not present

## 2024-02-01 DIAGNOSIS — F447 Conversion disorder with mixed symptom presentation: Secondary | ICD-10-CM | POA: Diagnosis not present

## 2024-02-02 ENCOUNTER — Encounter: Payer: Self-pay | Admitting: Pulmonary Disease

## 2024-02-14 DIAGNOSIS — F447 Conversion disorder with mixed symptom presentation: Secondary | ICD-10-CM | POA: Diagnosis not present

## 2024-02-22 DIAGNOSIS — F447 Conversion disorder with mixed symptom presentation: Secondary | ICD-10-CM | POA: Diagnosis not present

## 2024-03-10 DIAGNOSIS — Z419 Encounter for procedure for purposes other than remedying health state, unspecified: Secondary | ICD-10-CM | POA: Diagnosis not present

## 2024-03-16 DIAGNOSIS — F447 Conversion disorder with mixed symptom presentation: Secondary | ICD-10-CM | POA: Diagnosis not present

## 2024-03-29 ENCOUNTER — Ambulatory Visit: Admitting: Physician Assistant

## 2024-03-29 ENCOUNTER — Encounter: Payer: Self-pay | Admitting: Physician Assistant

## 2024-03-29 ENCOUNTER — Other Ambulatory Visit (INDEPENDENT_AMBULATORY_CARE_PROVIDER_SITE_OTHER)

## 2024-03-29 VITALS — BP 110/80 | HR 99 | Ht 62.0 in | Wt 170.0 lb

## 2024-03-29 DIAGNOSIS — R1011 Right upper quadrant pain: Secondary | ICD-10-CM | POA: Diagnosis not present

## 2024-03-29 DIAGNOSIS — F449 Dissociative and conversion disorder, unspecified: Secondary | ICD-10-CM

## 2024-03-29 DIAGNOSIS — D509 Iron deficiency anemia, unspecified: Secondary | ICD-10-CM | POA: Diagnosis not present

## 2024-03-29 DIAGNOSIS — K5909 Other constipation: Secondary | ICD-10-CM | POA: Diagnosis not present

## 2024-03-29 DIAGNOSIS — E538 Deficiency of other specified B group vitamins: Secondary | ICD-10-CM

## 2024-03-29 LAB — IBC + FERRITIN
Ferritin: 26 ng/mL (ref 10.0–291.0)
Iron: 90 ug/dL (ref 42–145)
Saturation Ratios: 24.6 % (ref 20.0–50.0)
TIBC: 365.4 ug/dL (ref 250.0–450.0)
Transferrin: 261 mg/dL (ref 212.0–360.0)

## 2024-03-29 LAB — CBC WITH DIFFERENTIAL/PLATELET
Basophils Absolute: 0 10*3/uL (ref 0.0–0.1)
Basophils Relative: 0.2 % (ref 0.0–3.0)
Eosinophils Absolute: 0.3 10*3/uL (ref 0.0–0.7)
Eosinophils Relative: 2.3 % (ref 0.0–5.0)
HCT: 47.5 % — ABNORMAL HIGH (ref 36.0–46.0)
Hemoglobin: 16 g/dL — ABNORMAL HIGH (ref 12.0–15.0)
Lymphocytes Relative: 24.5 % (ref 12.0–46.0)
Lymphs Abs: 3.1 10*3/uL (ref 0.7–4.0)
MCHC: 33.6 g/dL (ref 30.0–36.0)
MCV: 87.7 fl (ref 78.0–100.0)
Monocytes Absolute: 0.7 10*3/uL (ref 0.1–1.0)
Monocytes Relative: 5.4 % (ref 3.0–12.0)
Neutro Abs: 8.6 10*3/uL — ABNORMAL HIGH (ref 1.4–7.7)
Neutrophils Relative %: 67.6 % (ref 43.0–77.0)
Platelets: 254 10*3/uL (ref 150.0–400.0)
RBC: 5.42 Mil/uL — ABNORMAL HIGH (ref 3.87–5.11)
RDW: 13 % (ref 11.5–15.5)
WBC: 12.6 10*3/uL — ABNORMAL HIGH (ref 4.0–10.5)

## 2024-03-29 LAB — COMPREHENSIVE METABOLIC PANEL WITH GFR
ALT: 22 U/L (ref 0–35)
AST: 16 U/L (ref 0–37)
Albumin: 4.1 g/dL (ref 3.5–5.2)
Alkaline Phosphatase: 64 U/L (ref 39–117)
BUN: 8 mg/dL (ref 6–23)
CO2: 23 meq/L (ref 19–32)
Calcium: 9.5 mg/dL (ref 8.4–10.5)
Chloride: 105 meq/L (ref 96–112)
Creatinine, Ser: 0.85 mg/dL (ref 0.40–1.20)
GFR: 83.87 mL/min (ref >60.00)
Glucose, Bld: 139 mg/dL — ABNORMAL HIGH (ref 70–99)
Potassium: 4.1 meq/L (ref 3.5–5.1)
Sodium: 137 meq/L (ref 135–145)
Total Bilirubin: 0.4 mg/dL (ref 0.2–1.2)
Total Protein: 7.6 g/dL (ref 6.0–8.3)

## 2024-03-29 LAB — VITAMIN B12: Vitamin B-12: 152 pg/mL — ABNORMAL LOW (ref 211–911)

## 2024-03-29 MED ORDER — LINACLOTIDE 290 MCG PO CAPS
290.0000 ug | ORAL_CAPSULE | Freq: Every day | ORAL | 2 refills | Status: DC
Start: 2024-03-29 — End: 2024-04-30

## 2024-03-29 NOTE — Progress Notes (Signed)
 03/29/2024 Allison Galloway [id number] 22-May-1981  Referring provider: Galvin Jules, FNP Primary GI doctor: Dr. Venice Gillis ( Dr. Mordechai April transferred)  ASSESSMENT AND PLAN:  Iron Deficiency Anemia 01/20/2024  HGB 15.1 MCV 89 Platelets 280 07/27/2023 Iron 19 Ferritin 71 B12 233 Recent Labs    04/07/23 1122 04/07/23 1250 04/13/23 1252 04/20/23 1631 05/06/23 1435 05/06/23 1452 07/01/23 0847 07/27/23 1037 09/15/23 1025 01/20/24 1432  HGB 9.5* 9.3* 10.2* 10.4* 10.4* 11.2* 8.5* 9.0* 14.4 15.1  Thought to be secondary to menorrhagia status post total laparoscopic hysterectomy 07/05/2023 Status post IV iron hemoglobin improved from 9-14.4 and ferritin from 4.4-71 -Repeat iron/ferritin if any worsening anemia proceed with EGD colonoscopy. - she is not on an iron supplement  B12 deficiency B12 was 165 most recent check 2 months ago 233 Has a difficult time to take pills, will start on B12 injections if B12 still low  Chronic Constipation 04/2023 CT abdomen pelvis vaginal bleeding prominent hypodensity along endometrium cervix extending vagina substantial thickening of uterus, patient had hysterectomy, bowels were unremarkable Previously failed Amitiza , Trulance  improved with Linzess  to 90 -Provide Linzess  290 mcg -continue pill box -Advise continued use of a squatty potty, continue pelvic floor  RUQ pain  Can be worse after fatty foods, intermittent, neg murphy on exam but tender, CT 04/2023 with normal GB/liver Get CBC, CMET, RUQ US   Unintentional Weight Loss Weight has stabilized  Conversion disorder 03/03/2022 echocardiogram unremarkable Patient has left facial weakness/twitching and left leg weakness, stuttering of speech.  Has had unremarkable workup with pulmonary, cardiology, neurosurgery and neurology.  Incidental small 5 mm in right mid frontal meningioma but otherwise very thorough workup without known cause.  Ultimately felt she has conversion disorder due to anxiety  stress. Patient is no longer able to work or drive.    Patient Care Team: Galvin Jules, FNP as PCP - General (Family Medicine) Jann Melody, MD as PCP - Cardiology (Cardiology) Laine Piggs, NP as Nurse Practitioner (Radiology) Hyland Mailman, MD as Referring Physician (Optometry) Diania Fortes Francyne Islam, MD as Consulting Physician (Pulmonary Disease)  HISTORY OF PRESENT ILLNESS: 42 y.o. female with a past medical history of chronic constiapation, anxiety/depression, and others listed below presents for evaluation of IBS-C.   Notes reviewed from [facility]. Patient's had chronic constipation trial Amitiza  24 mcg twice daily with some benefit. 04/07/2022 office visit with PA Lewis at [facility], given Trulance  10/29/2022 labs reviewed show no leukocytosis, no anemia normal kidney liver, did show diabetes. 10/20/22 positive cannabinoid drug screen 07/27/2023 office visit with myself for IDA, suspicious for menorrhagia, status post hysterectomy if patient continues to have IDA will plan EGD colonoscopy. Patient also given Linzess  samples, pelvic floor physical therapy encourage 09/22/2023 office visit with oncology for IDA, has had IV iron administration in Sept.  Hemoglobin improved from 9-14.4 and ferritin from 4.4-71  Discussed the use of AI scribe software for clinical note transcription with the patient, who gave verbal consent to proceed.   History of Present Illness   Allison Galloway is a 43 year old female with conversion disorder who presents with medication management issues and abdominal pain.  She experiences difficulty with medication adherence, particularly with Linzess , due to memory issues associated with her conversion disorder. She has attempted to use a pillbox and is trying to establish a routine by associating medication intake with daily activities, such as feeding her cats.  She has a history of low vitamin B12 levels, with  a recent  measurement of 165, which is below the normal range. Symptoms consistent with B12 deficiency include dizziness and memory problems. She is not currently on B12 supplementation.  She experienced a recent episode of left-sided weakness resembling a stroke, lasting about five days, which affected her mobility. No black or bloody stools and no trouble swallowing. Some improvement in abdominal pain since starting Linzess , although occasional right upper quadrant pain persists.  Her iron levels were checked in February and were normal. She is not on an iron supplement due to adverse reactions to pills.         She  reports that she quit smoking about 2 years ago. Her smoking use included cigarettes. She has never used smokeless tobacco. She reports current drug use. Drug: Marijuana. She reports that she does not drink alcohol .  RELEVANT LABS AND IMAGING: CBC    Component Value Date/Time   WBC 13.5 (H) 01/20/2024 1432   WBC 11.3 (H) 09/15/2023 1025   RBC 5.13 01/20/2024 1432   RBC 5.74 (H) 09/15/2023 1025   HGB 15.1 01/20/2024 1432   HCT 45.7 01/20/2024 1432   PLT 280 01/20/2024 1432   MCV 89 01/20/2024 1432   MCH 29.4 01/20/2024 1432   MCH 25.1 (L) 09/15/2023 1025   MCHC 33.0 01/20/2024 1432   MCHC 32.4 09/15/2023 1025   RDW 13.2 01/20/2024 1432   LYMPHSABS 3.4 (H) 01/20/2024 1432   MONOABS 0.6 09/15/2023 1025   EOSABS 0.2 01/20/2024 1432   BASOSABS 0.1 01/20/2024 1432   Recent Labs    04/07/23 1122 04/07/23 1250 04/13/23 1252 04/20/23 1631 05/06/23 1435 05/06/23 1452 07/01/23 0847 07/27/23 1037 09/15/23 1025 01/20/24 1432  HGB 9.5* 9.3* 10.2* 10.4* 10.4* 11.2* 8.5* 9.0* 14.4 15.1     CMP     Component Value Date/Time   NA 137 07/27/2023 1037   NA 141 02/11/2023 0846   K 4.2 07/27/2023 1037   CL 105 07/27/2023 1037   CO2 24 07/27/2023 1037   GLUCOSE 139 (H) 07/27/2023 1037   BUN 12 07/27/2023 1037   BUN 10 02/11/2023 0846   CREATININE 0.91 07/27/2023 1037    CALCIUM 9.3 07/27/2023 1037   PROT 7.3 07/27/2023 1037   PROT 7.0 02/11/2023 0846   ALBUMIN 3.9 07/27/2023 1037   ALBUMIN 4.0 02/11/2023 0846   AST 12 07/27/2023 1037   ALT 13 07/27/2023 1037   ALKPHOS 76 07/27/2023 1037   BILITOT 0.3 07/27/2023 1037   BILITOT <0.2 02/11/2023 0846   GFRNONAA >60 07/01/2023 0847   GFRAA >60 07/11/2020 1926      Latest Ref Rng & Units 07/27/2023   10:37 AM 05/06/2023    2:35 PM 04/20/2023    4:31 PM  Hepatic Function  Total Protein 6.0 - 8.3 g/dL 7.3  8.3  8.2   Albumin 3.5 - 5.2 g/dL 3.9  4.1  4.3   AST 0 - 37 U/L 12  19  16    ALT 0 - 35 U/L 13  19  18    Alk Phosphatase 39 - 117 U/L 76  56  52   Total Bilirubin 0.2 - 1.2 mg/dL 0.3  0.3  0.8       Current Medications:   Current Outpatient Medications (Endocrine & Metabolic):    metFORMIN  (GLUCOPHAGE ) 850 MG tablet, Take 1 tablet (850 mg total) by mouth 2 (two) times daily with a meal.   norethindrone  (AYGESTIN ) 5 MG tablet, Take 2 tablets (10 mg total) by mouth daily.  Current Outpatient Medications (Cardiovascular):    enalapril  (VASOTEC ) 2.5 MG tablet, TAKE ONE (1) TABLET BY MOUTH EVERY DAY   Current Outpatient Medications (Analgesics):    Acetaminophen  Extra Strength 500 MG TABS, Take 2 tablets (1,000 mg total) by mouth every 6 (six) hours as needed.   ibuprofen  (ADVIL ) 800 MG tablet, Take 1 tablet (800 mg total) by mouth 3 (three) times daily with meals as needed for headache, moderate pain or cramping.  Current Outpatient Medications (Hematological):    ferrous sulfate  325 (65 FE) MG EC tablet, Take 1 tablet (325 mg total) by mouth every other day.  Current Outpatient Medications (Other):    blood glucose meter kit and supplies, Dispense based on patient and insurance preference. Use up to four times daily as directed. (FOR ICD-10 E10.9, E11.9).   busPIRone  (BUSPAR ) 5 MG tablet, Take 5 mg by mouth 3 (three) times daily as needed (for anxiety).   cyclobenzaprine  (FLEXERIL ) 10 MG tablet,  TAKE 1 TABLET BY MOUTH TWICE DAILY AS NEEDED FOR MUSCLE SPASMS   famotidine  (PEPCID ) 40 MG tablet, Take 1 tablet (40 mg total) by mouth at bedtime.   ofloxacin (FLOXIN) 0.3 % OTIC solution, 5 drops 2 (two) times daily.   Peppermint Oil (IBGARD) 90 MG CPCR, Take as directed.   polyethylene glycol powder (MIRALAX ) 17 GM/SCOOP powder, Take 17 g by mouth daily as needed for mild constipation.   linaclotide  (LINZESS ) 290 MCG CAPS capsule, Take 1 capsule (290 mcg total) by mouth daily before breakfast.  Medical History:  Past Medical History:  Diagnosis Date   Abnormal vaginal bleeding 2024   Anxiety    Follows w/ PCP   Arthritis    left knee   Chronic low back pain with bilateral sciatica    Constipation    Follows with [facility] for chronic constipation.   Conversion disorder    speech difficulty, tremors, weakness, negative cardiac, pulmonary & neuro imaging workup. Patient states all symptoms were thought to be related to conversion disorder.   Depression    Follows w/ PCP.   Facial weakness 2023   left-sided facial weakiness, speech difficulty w/ stuttering, left-sided weakness / Saw neurologist,Dr. Royce Coria on 03/15/22 in Epic.   Headache    hx of migraines   SOB (shortness of breath)    Former smoker. Follows w/ Dr. Diania Fortes pulmonology and Dr. Alison Irvine, cardiology. 03/03/22 Echo within normal limits, EF 65 -70%. Patient states SOB r/t panic attacks.   Type 2 diabetes mellitus (HCC)    Follows w/ PCP, Evalyn Hillier, FNP.   Wears glasses    Allergies: No Known Allergies   Surgical History:  She  has a past surgical history that includes Cesarean section; Tubal ligation (2006); Knee surgery (Left); Robotic assisted total hysterectomy with bilateral salpingo oophorectomy (Bilateral, 07/05/2023); Cystoscopy (N/A, 07/05/2023); and Abdominal hysterectomy. Family History:  Her family history includes Anxiety disorder in an other family member; Arthritis in her mother; Asthma in her  mother and sister; Bipolar disorder in an other family member; Depression in her mother, sister, and another family member; Diabetes in her mother; Hypertension in an other family member; Learning disabilities in her mother; Mental illness in her mother and sister; Seizures in her daughter.  REVIEW OF SYSTEMS  : All other systems reviewed and negative except where noted in the History of Present Illness.  PHYSICAL EXAM: BP 110/80   Pulse 99   Ht 5\' 2"  (1.575 m)   Wt 170 lb (77.1 kg)  LMP 10/02/2022 (Approximate) Comment: Patient has had irregular off and on bleeding in 2024.  BMI 31.09 kg/m  General Appearance: Well nourished, in no apparent distress. Head:   Normocephalic and atraumatic. Eyes:  sclerae anicteric,conjunctive pink  Respiratory: Respiratory effort normal, BS equal bilaterally without rales, rhonchi, wheezing. Cardio: RRR with no MRGs. Peripheral pulses intact.  Abdomen: Soft,  Obese , hypoactive bowel sounds. mild tenderness lower AB.  Without guarding and Without rebound. No masses. Rectal: Not evaluated Musculoskeletal: Full ROM, Normal gait. Without edema. Skin:  Dry and intact without significant lesions or rashes Neuro: Alert and  oriented x4;  Has some stuttering but no focal deficits Psych:  Cooperative. Normal mood and affect.    Edmonia Gottron, PA-C 11:32 AM

## 2024-03-29 NOTE — Patient Instructions (Addendum)
 Your provider has requested that you go to the basement level for lab work before leaving today. Press "B" on the elevator. The lab is located at the first door on the left as you exit the elevator.  Due to recent changes in healthcare laws, you may see the results of your imaging and laboratory studies on MyChart before your provider has had a chance to review them.  We understand that in some cases there may be results that are confusing or concerning to you. Not all laboratory results come back in the same time frame and the provider may be waiting for multiple results in order to interpret others.  Please give us  48 hours in order for your provider to thoroughly review all the results before contacting the office for clarification of your results.    You have been scheduled for an abdominal ultrasound at [facility] on 04/13/24 at 10:30am. Please arrive 30 minutes prior to your appointment for registration. Make certain not to have anything to eat or drink 6 hours prior to your appointment. Should you need to reschedule your appointment, please contact radiology at [phone]. This test typically takes about 30 minutes to perform.  Thank you for trusting me with your gastrointestinal care!   Santina Cull, PA-C  _______________________________________________________  If your blood pressure at your visit was 140/90 or greater, please contact your primary care physician to follow up on this.  _______________________________________________________  If you are age 74 or older, your body mass index should be between 23-30. Your Body mass index is 31.09 kg/m. If this is out of the aforementioned range listed, please consider follow up with your Primary Care Provider.  If you are age 60 or younger, your body mass index should be between 19-25. Your Body mass index is 31.09 kg/m. If this is out of the aformentioned range listed, please consider follow up with your Primary Care Provider.    ________________________________________________________  The [facility] providers would like to encourage you to use MYCHART to communicate with providers for non-urgent requests or questions.  Due to long hold times on the telephone, sending your provider a message by [facility] may be a faster and more efficient way to get a response.  Please allow 48 business hours for a response.  Please remember that this is for non-urgent requests.  _______________________________________________________    Linzess   *IBS-C patients may begin to experience relief from belly pain and overall abdominal symptoms (pain, discomfort, and bloating) in about 1 week,  with symptoms typically improving over 12 weeks.  Take at least 30 minutes before the first meal of the day on an empty stomach You can have a loose stool if you eat a high-fat breakfast. Give it at least 7 days, may have more bowel movements during that time.   The diarrhea should go away and you should start having normal, complete, full bowel movements.  It may be helpful to start treatment when you can be near the comfort of your own bathroom, such as a weekend.  After you are out we can send in a prescription if you did well, there is a prescription card  Toileting tips to help with your constipation - Drink at least 64-80 ounces of water /liquid per day. - Establish a time to try to move your bowels every day.  For many people, this is after a cup of coffee or after a meal such as breakfast. - Sit all of the way back on the toilet keeping your  back fairly straight and while sitting up, try to rest the tops of your forearms on your upper thighs.   - Raising your feet with a step stool/squatty potty can be helpful to improve the angle that allows your stool to pass through the rectum. - Relax the rectum feeling it bulge toward the toilet water .  If you feel your rectum raising toward your body, you are contracting rather than relaxing. -  Breathe in and slowly exhale. "Belly breath" by expanding your belly towards your belly button. Keep belly expanded as you gently direct pressure down and back to the anus.  A low pitched GRRR sound can assist with increasing intra-abdominal pressure.  (Can also trying to blow on a pinwheel and make it move, this helps with the same belly breathing) - Repeat 3-4 times. If unsuccessful, contract the pelvic floor to restore normal tone and get off the toilet.  Avoid excessive straining. - To reduce excessive wiping by teaching your anus to normally contract, place hands on outer aspect of knees and resist knee movement outward.  Hold 5-10 second then place hands just inside of knees and resist inward movement of knees.  Hold 5 seconds.  Repeat a few times each way.  Go to the ER if unable to pass gas, severe AB pain, unable to hold down food, any shortness of breath of chest pain.   B12 is mainly in meat, so increase meat can help this but often people have a deficiency that they are just not absorbing it well with meat or pills, so get the sublingual/melt in your mouth one.   If the sublingual one dose not increase your level, we will discuss shots.   Vitamin B12 Deficiency Vitamin B12 deficiency occurs when the body does not have enough vitamin B12, which is an important vitamin. The body needs this vitamin: To make red blood cells. To make DNA. This is the genetic material inside cells. To help the nerves work properly so they can carry messages from the brain to the body. Vitamin B12 deficiency can cause various health problems, such as a low red blood cell count (anemia) or nerve damage. What are the causes? This condition may be caused by: Not eating enough foods that contain vitamin B12. Not having enough stomach acid and digestive fluids to properly absorb vitamin B12 from the food that you eat. Certain digestive system diseases that make it hard to absorb vitamin B12. These diseases  include Crohn's disease, chronic pancreatitis, and cystic fibrosis. A condition in which the body does not make enough of a protein (intrinsic factor), resulting in too few red blood cells (pernicious anemia). Having a surgery in which part of the stomach or small intestine is removed. Taking certain medicines that make it hard for the body to absorb vitamin B12. These medicines include: Heartburn medicines (antacids and proton pump inhibitors). Certain antibiotic medicines. Some medicines that are used to treat diabetes, tuberculosis, gout, or high cholesterol. What increases the risk? The following factors may make you more likely to develop a B12 deficiency: Being older than age 17. Eating a vegetarian or vegan diet, especially while you are pregnant. Eating a poor diet while you are pregnant. Taking certain medicines. Having alcoholism. What are the signs or symptoms? In some cases, there are no symptoms of this condition. If the condition leads to anemia or nerve damage, various symptoms can occur, such as: Weakness. Fatigue. Loss of appetite. Weight loss. Numbness or tingling in your hands and feet.  Redness and burning of the tongue. Confusion or memory problems. Depression. Sensory problems, such as color blindness, ringing in the ears, or loss of taste. Diarrhea or constipation. Trouble walking. If anemia is severe, symptoms can include: Shortness of breath. Dizziness. Rapid heart rate (tachycardia). How is this diagnosed? This condition may be diagnosed with a blood test to measure the level of vitamin B12 in your blood. You may also have other tests, including: A group of tests that measure certain characteristics of blood cells (complete blood count, CBC). A blood test to measure intrinsic factor. A procedure where a thin tube with a camera on the end is used to look into your stomach or intestines (endoscopy). Other tests may be needed to discover the cause of B12  deficiency. How is this treated? Treatment for this condition depends on the cause. This condition may be treated by: Changing your eating and drinking habits, such as: Eating more foods that contain vitamin B12. Drinking less alcohol  or no alcohol . Getting vitamin B12 injections. Taking vitamin B12 supplements. Your health care provider will tell you which dosage is best for you. Follow these instructions at home: Eating and drinking  Eat lots of healthy foods that contain vitamin B12, including: Meats and poultry. This includes beef, pork, chicken, [place], and organ meats, such as liver. Seafood. This includes clams, rainbow trout, salmon, tuna, and haddock. Eggs. Cereal and dairy products that are fortified. This means that vitamin B12 has been added to the food. Check the label on the package to see if the food is fortified. The items listed above may not be a complete list of recommended foods and beverages. Contact a dietitian for more information. General instructions Get any injections that are prescribed by your health care provider. Take supplements only as told by your health care provider. Follow the directions carefully. Do not drink alcohol  if your health care provider tells you not to. In some cases, you may only be asked to limit alcohol  use. Keep all follow-up visits as told by your health care provider. This is important. Contact a health care provider if: Your symptoms come back. Get help right away if you: Develop shortness of breath. Have a rapid heart rate. Have chest pain. Become dizzy or lose consciousness. Summary Vitamin B12 deficiency occurs when the body does not have enough vitamin B12. The main causes of vitamin B12 deficiency include dietary deficiency, digestive diseases, pernicious anemia, and having a surgery in which part of the stomach or small intestine is removed. In some cases, there are no symptoms of this condition. If the condition leads to  anemia or nerve damage, various symptoms can occur, such as weakness, shortness of breath, and numbness. Treatment may include getting vitamin B12 injections or taking vitamin B12 supplements. Eat lots of healthy foods that contain vitamin B12. This information is not intended to replace advice given to you by your health care provider. Make sure you discuss any questions you have with your health care provider. Document Revised: 05/04/2019 Document Reviewed: 07/25/2018 Elsevier Patient Education  2020 [employer].

## 2024-03-30 DIAGNOSIS — F447 Conversion disorder with mixed symptom presentation: Secondary | ICD-10-CM | POA: Diagnosis not present

## 2024-04-02 ENCOUNTER — Encounter (HOSPITAL_COMMUNITY): Payer: Self-pay | Admitting: Registered Nurse

## 2024-04-02 ENCOUNTER — Other Ambulatory Visit: Payer: Self-pay

## 2024-04-02 ENCOUNTER — Telehealth: Payer: Self-pay | Admitting: Nurse Practitioner

## 2024-04-02 ENCOUNTER — Ambulatory Visit (INDEPENDENT_AMBULATORY_CARE_PROVIDER_SITE_OTHER): Admitting: Registered Nurse

## 2024-04-02 VITALS — BP 110/80 | HR 99 | Ht 62.0 in | Wt 169.4 lb

## 2024-04-02 DIAGNOSIS — D509 Iron deficiency anemia, unspecified: Secondary | ICD-10-CM

## 2024-04-02 DIAGNOSIS — F411 Generalized anxiety disorder: Secondary | ICD-10-CM | POA: Diagnosis not present

## 2024-04-02 DIAGNOSIS — F444 Conversion disorder with motor symptom or deficit: Secondary | ICD-10-CM | POA: Diagnosis not present

## 2024-04-02 DIAGNOSIS — F332 Major depressive disorder, recurrent severe without psychotic features: Secondary | ICD-10-CM

## 2024-04-02 DIAGNOSIS — G4700 Insomnia, unspecified: Secondary | ICD-10-CM | POA: Diagnosis not present

## 2024-04-02 LAB — TISSUE TRANSGLUTAMINASE, IGA: (tTG) Ab, IgA: <1 U/mL

## 2024-04-02 LAB — IGA: Immunoglobulin A: 438 mg/dL — ABNORMAL HIGH (ref 47–310)

## 2024-04-02 LAB — VITAMIN B1: Vitamin B1 (Thiamine): 6 nmol/L — ABNORMAL LOW (ref 8–30)

## 2024-04-02 MED ORDER — HYDROXYZINE HCL 10 MG PO TABS: ORAL_TABLET | ORAL | 0 refills | Status: DC

## 2024-04-02 MED ORDER — FLUOXETINE HCL 10 MG PO CAPS: 10.0000 mg | ORAL_CAPSULE | Freq: Every day | ORAL | 0 refills | Status: DC

## 2024-04-02 MED ORDER — CLONIDINE HCL 0.1 MG PO TABS: 0.0500 mg | ORAL_TABLET | Freq: Two times a day (BID) | ORAL | 0 refills | Status: DC

## 2024-04-02 MED ORDER — NA SULFATE-K SULFATE-MG SULF 17.5-3.13-1.6 GM/177ML PO SOLN: 1.0000 | Freq: Once | ORAL | 0 refills | Status: AC

## 2024-04-02 NOTE — Patient Instructions (Addendum)
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 - Gathering medical  and developmental history Standardized Testing - Completing various cognitive tasks on a computer or paper Behavioral Assessments - Using rating scales and questionnaires completed by the individual and/or caregivers Comprehensive Report - A detailed  breakdown of strengths, weaknesses, and personalized recommendations    Offers Virtual Therapy  [address] [place], [place] [address]  Phone:  [phone] Fax: [phone]  [facility] Outpatient Program (OP) An Outpatient Treatment Program, or OP, is a service for individuals seeking support for substance abuse or mental health concerns who do not require frequent or intense support or safety monitoring. This level is appropriate for people with less severe disorders, or as a step-down from more intensive services. At [facility], OP is available for individuals aged 7 and above. It consists of basic treatment services offered in individual and group format and totals to less than 9 hours a week. Both virtual and in-person options for attending are available. OP is conducted by treatment professionals licensed to serve individuals with mental health and substance use disorders within [place]. OP helps individuals address a broad range of psychological and interpersonal challenges including: Substance Related Disorders Behavioral Addictions Anxiety Depression Trauma and Stressor Related Issues PTSD Parenting and Family Issues Relationship Issues Stress Self-Esteem Bipolar Disorders Life Transitions Personality Disorders ADHD Grief and Loss  *[facility] is proud to offer specialized treatment services on an outpatient level including, EMDR, Marriage/Family Counseling and Christian Counseling  We currently accept all major insurance, including: Medicaid,Uninsured, [employer], [address], [place], [employer], [place], [employer], [employer], [employer], [employer] Phone:  [phone] Website:  [email or web address]           How to get started with virtual therapy covered by Medicaid: Medicaid-covered members can call our Admissions Team 24/7 or fill out  our online form to learn about their plan's specific benefits and get started with treatment. Once we verify your Medicaid benefits, our Clinical Team will conduct a thorough mental health assessment to create your personalized treatment plan. Medicaid members can get started with their personalized treatment plan (which includes curated groups, individual therapy, and family therapy) in as little as 24 hours. Call [phone]  What we Treat:  Anxiety Treatment for Teens and Adults  Our therapists specialize in cognitive behavioral therapy, a leading anxiety treatment, to provide evidence-based mental healthcare for teens and adults dealing with anxiety disorders. Fill out the short form below or call us directly to start healing from anxiety today with [facility].  Depression Treatment for Teens and Adults Depression affects millions of people worldwide, but healing is possible with evidence-based treatment.   Trauma Treatment for Teens and Adults After surviving trauma, building connections and receiving trauma-informed care are critical for long-lasting healing. That's why [facility] offers trauma-informed therapy in individual and group sessions.   Self-Harm Treatment for Teens and Adults Self-harm is often linked to serious mental health issues, which is why understanding the root of self-harm is key to long-lasting recovery.   Suicidal ideation Passive suicidal ideation, chronic suicidal ideation, previous suicide attempt  Substance Use Disorders Treatment for Teens and Adults:   Alcohol, marijuana, prescription drugs, opioids, amphetamines, cocaine, inhalants, hallucinogens, nicotine Understanding the mental health roots of substance use disorders (SUD) is key to long-lasting recovery.

## 2024-04-02 NOTE — Telephone Encounter (Addendum)
 Patient returning call. Please advise. From call on 5/5 patient message.

## 2024-04-02 NOTE — Telephone Encounter (Signed)
 Spoke to pt.  Documented in result notes
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
## 2024-04-03 ENCOUNTER — Other Ambulatory Visit: Payer: Self-pay

## 2024-04-03 DIAGNOSIS — D509 Iron deficiency anemia, unspecified: Secondary | ICD-10-CM

## 2024-04-04 ENCOUNTER — Ambulatory Visit: Payer: BC Managed Care – PPO | Admitting: Family Medicine

## 2024-04-04 VITALS — BP 112/78 | HR 99 | Temp 97.5°F | Ht 62.0 in | Wt 168.8 lb

## 2024-04-04 DIAGNOSIS — E119 Type 2 diabetes mellitus without complications: Secondary | ICD-10-CM

## 2024-04-04 DIAGNOSIS — E538 Deficiency of other specified B group vitamins: Secondary | ICD-10-CM

## 2024-04-04 DIAGNOSIS — R7989 Other specified abnormal findings of blood chemistry: Secondary | ICD-10-CM

## 2024-04-04 DIAGNOSIS — Z0001 Encounter for general adult medical examination with abnormal findings: Secondary | ICD-10-CM | POA: Diagnosis not present

## 2024-04-04 DIAGNOSIS — Z0000 Encounter for general adult medical examination without abnormal findings: Secondary | ICD-10-CM

## 2024-04-04 DIAGNOSIS — Z114 Encounter for screening for human immunodeficiency virus [HIV]: Secondary | ICD-10-CM | POA: Diagnosis not present

## 2024-04-04 DIAGNOSIS — Z1159 Encounter for screening for other viral diseases: Secondary | ICD-10-CM | POA: Diagnosis not present

## 2024-04-04 DIAGNOSIS — Z1231 Encounter for screening mammogram for malignant neoplasm of breast: Secondary | ICD-10-CM | POA: Diagnosis not present

## 2024-04-04 DIAGNOSIS — Z7984 Long term (current) use of oral hypoglycemic drugs: Secondary | ICD-10-CM | POA: Diagnosis not present

## 2024-04-04 LAB — BAYER DCA HB A1C WAIVED: HB A1C (BAYER DCA - WAIVED): 6.6 % — ABNORMAL HIGH (ref 4.8–5.6)

## 2024-04-04 MED ORDER — ENALAPRIL MALEATE 2.5 MG PO TABS: 2.5000 mg | ORAL_TABLET | Freq: Every day | ORAL | 1 refills | Status: DC

## 2024-04-04 NOTE — Patient Instructions (Signed)
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
## 2024-04-05 LAB — ANEMIA PROFILE B
Basophils Absolute: 0 10*3/uL (ref 0.0–0.2)
Basos: 0 %
EOS (ABSOLUTE): 0.3 10*3/uL (ref 0.0–0.4)
Eos: 3 %
Ferritin: 82 ng/mL (ref 15–150)
Folate: 9.2 ng/mL (ref >3.0)
Hematocrit: 49.9 % — ABNORMAL HIGH (ref 34.0–46.6)
Hemoglobin: 17 g/dL — ABNORMAL HIGH (ref 11.1–15.9)
Immature Grans (Abs): 0 10*3/uL (ref 0.0–0.1)
Immature Granulocytes: 0 %
Iron Saturation: 27 % (ref 15–55)
Iron: 102 ug/dL (ref 27–159)
Lymphocytes Absolute: 3.6 10*3/uL — ABNORMAL HIGH (ref 0.7–3.1)
Lymphs: 29 %
MCH: 30.4 pg (ref 26.6–33.0)
MCHC: 34.1 g/dL (ref 31.5–35.7)
MCV: 89 fL (ref 79–97)
Monocytes Absolute: 0.7 10*3/uL (ref 0.1–0.9)
Monocytes: 6 %
Neutrophils Absolute: 7.7 10*3/uL — ABNORMAL HIGH (ref 1.4–7.0)
Neutrophils: 62 %
Platelets: 267 10*3/uL (ref 150–450)
RBC: 5.59 x10E6/uL — ABNORMAL HIGH (ref 3.77–5.28)
RDW: 13.1 % (ref 11.7–15.4)
Retic Ct Pct: 1.4 % (ref 0.6–2.6)
Total Iron Binding Capacity: 379 ug/dL (ref 250–450)
UIBC: 277 ug/dL (ref 131–425)
Vitamin B-12: 237 pg/mL (ref 232–1245)
WBC: 12.3 10*3/uL — ABNORMAL HIGH (ref 3.4–10.8)

## 2024-04-05 LAB — CMP14+EGFR
ALT: 22 IU/L (ref 0–32)
AST: 16 IU/L (ref 0–40)
Albumin: 4.7 g/dL (ref 3.9–4.9)
Alkaline Phosphatase: 87 IU/L (ref 44–121)
BUN/Creatinine Ratio: 13 (ref 9–23)
BUN: 13 mg/dL (ref 6–24)
Bilirubin Total: 0.4 mg/dL (ref 0.0–1.2)
CO2: 19 mmol/L — ABNORMAL LOW (ref 20–29)
Calcium: 9.8 mg/dL (ref 8.7–10.2)
Chloride: 101 mmol/L (ref 96–106)
Creatinine, Ser: 1.01 mg/dL — ABNORMAL HIGH (ref 0.57–1.00)
Globulin, Total: 3.2 g/dL (ref 1.5–4.5)
Glucose: 152 mg/dL — ABNORMAL HIGH (ref 70–99)
Potassium: 4.2 mmol/L (ref 3.5–5.2)
Sodium: 139 mmol/L (ref 134–144)
Total Protein: 7.9 g/dL (ref 6.0–8.5)
eGFR: 71 mL/min/{1.73_m2} (ref >59)

## 2024-04-05 NOTE — Addendum Note (Signed)
 Addended by: Wynelle Heather on: 04/05/2024 04:14 PM   Modules accepted: Orders
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
## 2024-04-06 DIAGNOSIS — F447 Conversion disorder with mixed symptom presentation: Secondary | ICD-10-CM | POA: Diagnosis not present

## 2024-04-06 LAB — HIV ANTIBODY (ROUTINE TESTING W REFLEX): HIV Screen 4th Generation wRfx: NONREACTIVE

## 2024-04-06 LAB — LIPID PANEL
Chol/HDL Ratio: 5.2 ratio — ABNORMAL HIGH (ref 0.0–4.4)
Cholesterol, Total: 182 mg/dL (ref 100–199)
HDL: 35 mg/dL — ABNORMAL LOW (ref >39)
LDL Chol Calc (NIH): 102 mg/dL — ABNORMAL HIGH (ref 0–99)
Triglycerides: 264 mg/dL — ABNORMAL HIGH (ref 0–149)
VLDL Cholesterol Cal: 45 mg/dL — ABNORMAL HIGH (ref 5–40)

## 2024-04-06 LAB — MICROALBUMIN / CREATININE URINE RATIO
Creatinine, Urine: 186.5 mg/dL
Microalb/Creat Ratio: 4 mg/g{creat} (ref 0–29)
Microalbumin, Urine: 8.3 ug/mL

## 2024-04-06 LAB — THYROID PANEL WITH TSH
Free Thyroxine Index: 2.3 (ref 1.2–4.9)
T3 Uptake Ratio: 23 % — ABNORMAL LOW (ref 24–39)
T4, Total: 10 ug/dL (ref 4.5–12.0)
TSH: 0.906 u[IU]/mL (ref 0.450–4.500)

## 2024-04-06 LAB — HEPATITIS C ANTIBODY: Hep C Virus Ab: NONREACTIVE

## 2024-04-09 DIAGNOSIS — Z419 Encounter for procedure for purposes other than remedying health state, unspecified: Secondary | ICD-10-CM | POA: Diagnosis not present

## 2024-04-13 ENCOUNTER — Ambulatory Visit (HOSPITAL_COMMUNITY)
Admission: RE | Admit: 2024-04-13 | Discharge: 2024-04-13 | Disposition: A | Discharge status: Home / Self Care | Source: Ambulatory Visit | Attending: Physician Assistant | Admitting: Physician Assistant

## 2024-04-13 ENCOUNTER — Ambulatory Visit: Payer: Self-pay | Admitting: Physician Assistant

## 2024-04-13 DIAGNOSIS — R110 Nausea: Secondary | ICD-10-CM | POA: Diagnosis not present

## 2024-04-13 DIAGNOSIS — R1011 Right upper quadrant pain: Secondary | ICD-10-CM | POA: Insufficient documentation

## 2024-04-16 ENCOUNTER — Telehealth (HOSPITAL_COMMUNITY): Admitting: Registered Nurse

## 2024-04-16 ENCOUNTER — Encounter (HOSPITAL_COMMUNITY): Payer: Self-pay | Admitting: Registered Nurse

## 2024-04-16 DIAGNOSIS — F411 Generalized anxiety disorder: Secondary | ICD-10-CM | POA: Diagnosis not present

## 2024-04-16 DIAGNOSIS — G4700 Insomnia, unspecified: Secondary | ICD-10-CM | POA: Diagnosis not present

## 2024-04-16 DIAGNOSIS — F444 Conversion disorder with motor symptom or deficit: Secondary | ICD-10-CM | POA: Diagnosis not present

## 2024-04-16 DIAGNOSIS — F332 Major depressive disorder, recurrent severe without psychotic features: Secondary | ICD-10-CM

## 2024-04-16 MED ORDER — GUANFACINE HCL 1 MG PO TABS
1.0000 mg | ORAL_TABLET | Freq: Every day | ORAL | 0 refills | Status: DC
Start: 2024-04-16 — End: 2024-04-30

## 2024-04-16 MED ORDER — FLUOXETINE HCL 20 MG PO CAPS: 20.0000 mg | ORAL_CAPSULE | Freq: Every day | ORAL | 0 refills | Status: DC

## 2024-04-16 MED ORDER — HYDROXYZINE HCL 10 MG PO TABS: ORAL_TABLET | ORAL | 0 refills | Status: DC

## 2024-04-16 NOTE — Patient Instructions (Signed)

## 2024-04-16 NOTE — Progress Notes (Signed)
 BH MD/PA/NP OP Progress Note  04/16/2024 9:49 AM Allison Galloway  MRN:  [id number]  Virtual Visit via Video Note  I connected with Allison Galloway on 04/16/24 at  9:30 AM EDT by a video enabled telemedicine application and verified that I am speaking with the correct person using two identifiers.  Location: Patient: Home Provider: [place], [place]   I discussed the limitations of evaluation and management by telemedicine and the availability of in person appointments. The patient expressed understanding and agreed to proceed.   I discussed the assessment and treatment plan with the patient. The patient was provided an opportunity to ask questions and all were answered. The patient agreed with the plan and demonstrated an understanding of the instructions.   The patient was advised to call back or seek an in-person evaluation if the symptoms worsen or if the condition fails to improve as anticipated.  I provided 30 minutes of non-face-to-face time during this encounter.   Allison Magnus, NP   Chief Complaint:  Chief Complaint  Patient presents with   Follow-up    Medication management   HPI: Allison Galloway 43 y.o. female presents today for medication management follow up.  She is seen via virtual video visit by this provider, and chart reviewed on 04/16/24.  Her psychiatric history is significant for major depressive disorder, general anxiety, conversion disorder with abnormal movement.  Her mental health is currently managed with Prozac  0 mg daily, clonidine  0.05 mg twice daily, and Vistaril  10-20 mg 3 times daily as needed.  She reports she is feeling better and feels that current medication regimen is working with no adverse reactions except for some dizziness and nausea.  Reports she has noticed improvement in her abnormal movements and takes.  Reports she wanted to test herself to see how she did in public and [place] "Before I started my medicine I would have  lasted 5 minutes.  But since starting the medicine I was able to stay for 30 minutes before I could not take it anymore.  The tics started kicking in so I left."  She reports that it was more situational with increased anxiety that caused the tics and movements to start. "It was all the people, the loud music, and being out of my comfort zone.  But I missed doing the things that I enjoy and I feel like I am on the right track now because I can tell there is a difference and I feel like I am improving. Today she denies suicidal/self-harm/homicidal ideation, psychosis, and paranoia.  She reports improvement in anxiety/depression, tics, and abnormal movements.  She reports her sleep is fair and has improved some but feels it could be better.  Reports she is getting anywhere from 4 to 5 hours of sleep.  Reports she is eating without any difficulty.  Recommended the following: Increase Prozac  to 20 mg daily, continue Vistaril  10-20 mg 3 times daily as needed and 10-30 mg nightly as needed sleep, discontinue Clonidine  and start Guanfacine  1 mg nightly.  She is Informed of side effect/efficacy profile on Guanfacine .  She voices understanding with information being given to her today and is agreeable to recommendations.    Visit Diagnosis:    ICD-10-CM   1. Severe episode of recurrent major depressive disorder, without psychotic features (HCC)  F33.2 FLUoxetine  (PROZAC ) 20 MG capsule    2. GAD (generalized anxiety disorder)  F41.1 FLUoxetine  (PROZAC ) 20 MG capsule    hydrOXYzine  (  ATARAX ) 10 MG tablet    3. Conversion disorder with abnormal movement  F44.4 FLUoxetine  (PROZAC ) 20 MG capsule    guanFACINE  (TENEX ) 1 MG tablet    4. Insomnia, unspecified type  G47.00 hydrOXYzine  (ATARAX ) 10 MG tablet     Past Psychiatric History: Depression, anxiety conversion disorder   Past Medical History:  Past Medical History:  Diagnosis Date   Abnormal vaginal bleeding 2024   Anxiety    Follows w/ PCP   Arthritis     left knee   Chronic low back pain with bilateral sciatica    Constipation    Follows with  GI for chronic constipation.   Conversion disorder    speech difficulty, tremors, weakness, negative cardiac, pulmonary & neuro imaging workup. Patient states all symptoms were thought to be related to conversion disorder.   Delta-9-tetrahydrocannabinol (THC) dependence (HCC)    Depression    Follows w/ PCP.   Facial weakness 2023   left-sided facial weakiness, speech difficulty w/ stuttering, left-sided weakness / Saw neurologist,Dr. Royce Coria on 03/15/22 in Epic.   Headache    hx of migraines   SOB (shortness of breath)    Former smoker. Follows w/ Dr. Diania Fortes pulmonology and Dr. Alison Irvine, cardiology. 03/03/22 Echo within normal limits, EF 65 -70%. Patient states SOB r/t panic attacks.   Type 2 diabetes mellitus (HCC)    Follows w/ PCP, Evalyn Hillier, FNP.   Wears glasses     Past Surgical History:  Procedure Laterality Date   ABDOMINAL HYSTERECTOMY     CESAREAN SECTION     3   CYSTOSCOPY N/A 07/05/2023   Procedure: CYSTOSCOPY;  Surgeon: Kiki Pelton, MD;  Location: [facility];  Service: Gynecology;  Laterality: N/A;   KNEE SURGERY Left    x 3   ROBOTIC ASSISTED TOTAL HYSTERECTOMY WITH BILATERAL SALPINGO OOPHERECTOMY Bilateral 07/05/2023   Procedure: XI ROBOTIC ASSISTED TOTAL LAPRASCOPIC HYSTERECTOMY WITH BILATERAL SALPINGECTOMY,LYSIS OF ACHESIONS, TAP BLOCK;  Surgeon: Kiki Pelton, MD;  Location: [facility];  Service: Gynecology;  Laterality: Bilateral;   TUBAL LIGATION  2006    Family Psychiatric History: See below and family history  Family History:  Family History  Problem Relation Age of Onset   Bipolar disorder Mother    Anxiety disorder Mother    Diabetes Mother    Depression Mother    Arthritis Mother    Asthma Mother    Learning disabilities Mother    Mental illness Mother    Anxiety disorder Sister    Depression  Sister    Asthma Sister    Mental illness Sister    Seizures Daughter    Hypertension Other    Bipolar disorder Other    Anxiety disorder Other    Depression Other     Social History:  Social History   Socioeconomic History   Marital status: Married    Spouse name: Not on file   Number of children: 3   Years of education: GED   Highest education level: GED or equivalent  Occupational History   Not on file  Tobacco Use   Smoking status: Former    Current packs/day: 0.00    Types: Cigarettes    Quit date: 08/2021    Years since quitting: 2.6   Smokeless tobacco: Never  Vaping Use   Vaping status: Every Day   Substances: Nicotine, THC, Flavoring  Substance and Sexual Activity   Alcohol  use: No   Drug use: Yes    Types:  Marijuana    Comment: delta 8 daily use   Sexual activity: Yes    Partners: Male    Birth control/protection: Surgical    Comment: tubal, menarche 43yo, sexual debut 43yo  Other Topics Concern   Not on file  Social History Narrative   Not on file   Social Drivers of Health   Financial Resource Strain: Low Risk  (04/04/2024)   Overall Financial Resource Strain (CARDIA)    Difficulty of Paying Living Expenses: Not very hard  Food Insecurity: Food Insecurity Present (04/04/2024)   Hunger Vital Sign    Worried About Running Out of Food in the Last Year: Sometimes true    Ran Out of Food in the Last Year: Never true  Transportation Needs: No Transportation Needs (04/04/2024)   PRAPARE - Administrator, Civil Service (Medical): No    Lack of Transportation (Non-Medical): No  Physical Activity: Unknown (04/04/2024)   Exercise Vital Sign    Days of Exercise per Week: Patient declined    Minutes of Exercise per Session: Not on file  Stress: Stress Concern Present (04/04/2024)   [employer] of Occupational Health - Occupational Stress Questionnaire    Feeling of Stress : Very much  Social Connections: Socially Isolated (04/04/2024)   Social  Connection and Isolation Panel [NHANES]    Frequency of Communication with Friends and Family: Twice a week    Frequency of Social Gatherings with Friends and Family: Never    Attends Religious Services: Never    Database administrator or Organizations: No    Attends Engineer, structural: Not on file    Marital Status: Married    Allergies: No Known Allergies  Metabolic Disorder Labs: Lab Results  Component Value Date   HGBA1C 6.6 (H) 04/04/2024   Lab Results  Component Value Date   PROLACTIN 11.0 06/22/2022   Lab Results  Component Value Date   CHOL 182 04/04/2024   TRIG 264 (H) 04/04/2024   HDL 35 (L) 04/04/2024   CHOLHDL 5.2 (H) 04/04/2024   LDLCALC 102 (H) 04/04/2024   LDLCALC 97 02/11/2023   Lab Results  Component Value Date   TSH 0.906 04/04/2024   TSH 0.469 10/29/2022   Current Medications: Current Outpatient Medications  Medication Sig Dispense Refill   guanFACINE  (TENEX ) 1 MG tablet Take 1 tablet (1 mg total) by mouth at bedtime. 30 tablet 0   blood glucose meter kit and supplies Dispense based on patient and insurance preference. Use up to four times daily as directed. (FOR ICD-10 E10.9, E11.9). 1 each 0   enalapril  (VASOTEC ) 2.5 MG tablet Take 1 tablet (2.5 mg total) by mouth daily. 90 tablet 1   ferrous sulfate  325 (65 FE) MG EC tablet Take 1 tablet (325 mg total) by mouth every other day. 45 tablet 3   FLUoxetine  (PROZAC ) 20 MG capsule Take 1 capsule (20 mg total) by mouth daily. 30 capsule 0   hydrOXYzine  (ATARAX ) 10 MG tablet Take 10-20 mg (1-2 tablets) three times a day as needed for anxiety.  Take 10-30 mg (1-3 tablets) as needed for sleep 60 tablet 0   linaclotide  (LINZESS ) 290 MCG CAPS capsule Take 1 capsule (290 mcg total) by mouth daily before breakfast. 90 capsule 2   metFORMIN  (GLUCOPHAGE ) 850 MG tablet Take 1 tablet (850 mg total) by mouth 2 (two) times daily with a meal. 180 tablet 2   norethindrone  (AYGESTIN ) 5 MG tablet Take 2 tablets  (10 mg total) by  mouth daily. 60 tablet 2   ofloxacin (FLOXIN) 0.3 % OTIC solution 5 drops 2 (two) times daily.     No current facility-administered medications for this visit.     Musculoskeletal: Strength & Muscle Tone: Unable to assess via virtual visit Gait & Station: Unable to assess via virtual visit Patient leans: N/A  Psychiatric Specialty Exam: Review of Systems  Constitutional:        No other complaints voiced at this time  Psychiatric/Behavioral:  Positive for dysphoric mood (Improving) and sleep disturbance (Improving). Negative for hallucinations, self-injury and suicidal ideas. The patient is nervous/anxious (Improving).        Reporting improvement in involuntary movement and vocalization  All other systems reviewed and are negative.   Last menstrual period 10/02/2022.There is no height or weight on file to calculate BMI.  General Appearance: Casual  Eye Contact:  Good  Speech:  Clear and Coherent and Normal Rate  Volume:  Normal  Mood:  Euthymic  Affect:  Appropriate and Congruent  Thought Process:  Coherent, Goal Directed, and Descriptions of Associations: Intact  Orientation:  Full (Time, Place, and Person)  Thought Content: WDL and Logical   Suicidal Thoughts:  No  Homicidal Thoughts:  No  Memory:  Immediate;   Good Recent;   Good Remote;   Good  Judgement:  Intact  Insight:  Present  Psychomotor Activity:  repetitive, involuntary movements and vocalizations  Concentration:  Concentration: Good and Attention Span: Good  Recall:  Good  Fund of Knowledge: Good  Language: Good  Akathisia:  No  Handed:  Right  AIMS (if indicated): not done  Assets:  Communication Skills Desire for Improvement Financial Resources/Insurance Housing Intimacy Leisure Time Resilience Social Support Transportation  ADL's:  Intact  Cognition: WNL  Sleep:  Fair   Screenings: Geneticist, molecular Office Visit from 04/02/2024 in [facility] [place]  AIMS Total Score 12      GAD-7    Flowsheet Row Office Visit from 04/04/2024 in [facility] Office Visit from 04/02/2024 in [facility] [place] Office Visit from 01/20/2024 in [facility] Office Visit from 11/10/2023 in [facility] Office Visit from 08/16/2023 in [facility]  Total GAD-7 Score 18 19 14 10 8       PHQ2-9    Flowsheet Row Office Visit from 04/04/2024 in [facility] Office Visit from 04/02/2024 in [facility] [place] Office Visit from 01/20/2024 in [facility] Office Visit from 11/10/2023 in [facility] Office Visit from 08/16/2023 in [facility]  PHQ-2 Total Score 5 5 4 3 3   PHQ-9 Total Score 17 19 19 14 15       Flowsheet Row Office Visit from 04/02/2024 in [facility] [place] Admission (Discharged) from 07/05/2023 in [facility] ED from 05/06/2023 in [facility]  C-SSRS RISK CATEGORY No Risk No Risk No Risk        Assessment and Plan:  Assessment: Patient seen and examined as noted above. Summary: Today Allison Galloway appears to be doing well.    She reports current medications are effective and managing her mental health but has had dizziness and nausea in with morning medication (Clonidine ).  Reports other than  that she is doing well and has noticed improvement with anxiety/depression, involuntary movements, tics and vocalization.  She denies suicidal/self-harm/homicidal ideation, psychosis, and paranoia. During visit she is dressed appropriate for age and weather.  She is seated comfortably in view of camera with no noted distress.  She is  alert/oriented x 4, calm/cooperative and mood is congruent with affect.  She spoke in a clear tone at moderate volume, and normal pace, with good eye contact.  Her thought process is coherent, relevant, and there is no indication that she is currently responding to internal/external stimuli or experiencing delusional thought content.    1. Severe episode of recurrent major depressive disorder, without psychotic features (HCC) (Primary) - FLUoxetine  (PROZAC ) 20 MG capsule; Take 1 capsule (20 mg total) by mouth daily.  Dispense: 30 capsule; Refill: 0  2. GAD (generalized anxiety disorder) - FLUoxetine  (PROZAC ) 20 MG capsule; Take 1 capsule (20 mg total) by mouth daily.  Dispense: 30 capsule; Refill: 0 - hydrOXYzine  (ATARAX ) 10 MG tablet; Take 10-20 mg (1-2 tablets) three times a day as needed for anxiety.  Take 10-30 mg (1-3 tablets) as needed for sleep  Dispense: 60 tablet; Refill: 0  3. Conversion disorder with abnormal movement - FLUoxetine  (PROZAC ) 20 MG capsule; Take 1 capsule (20 mg total) by mouth daily.  Dispense: 30 capsule; Refill: 0 - guanFACINE  (TENEX ) 1 MG tablet; Take 1 tablet (1 mg total) by mouth at bedtime.  Dispense: 30 tablet; Refill: 0  4. Insomnia, unspecified type - hydrOXYzine  (ATARAX ) 10 MG tablet; Take 10-20 mg (1-2 tablets) three times a day as needed for anxiety.  Take 10-30 mg (1-3 tablets) as needed for sleep  Dispense: 60 tablet; Refill: 0    Plan: Medications: Meds ordered this encounter  Medications   FLUoxetine  (PROZAC ) 20 MG capsule    Sig: Take 1 capsule (20 mg total) by mouth daily.    Dispense:  30 capsule    Refill:  0    Supervising Provider:   Carlos Galloway, Allison T [[id number]]   hydrOXYzine  (ATARAX ) 10 MG tablet    Sig: Take 10-20 mg (1-2 tablets) three times a day as needed for anxiety.  Take 10-30 mg (1-3 tablets) as needed for sleep    Dispense:  60 tablet    Refill:  0    Supervising Provider:   ARFEEN, Allison T [[id number]]   guanFACINE  (TENEX ) 1 MG tablet     Sig: Take 1 tablet (1 mg total) by mouth at bedtime.    Dispense:  30 tablet    Refill:  0    Supervising Provider:   Eduard Grad T [[id number]]    Labs:  Not indicated at this time  Other:  Referred to counseling/therapy.  Informed to set an appointment with counseling/therapy when her next medication management follow-up visit is scheduled.     Allison Galloway is instructed to call [phone], mobile crisis, or present to the nearest emergency room should she experience any suicidal/homicidal ideation, auditory/visual/hallucinations, or detrimental worsening of her mental health condition.   Allison Galloway has participated in the development of this treatment plan and verbalized her agreement with plan as listed.  Follow Up: Return in 2 weeks for medication management follow up Call in the interim for any side-effects, decompensation, questions, or problems  Collaboration of Care: Collaboration of Care: Medication Management AEB medication assessment, adjustment, and refills and Referral or follow-up with counselor/therapist AEB referral to counseling/therapy  Patient/Guardian was advised Release of Information must be  obtained prior to any record release in order to collaborate their care with an outside provider. Patient/Guardian was advised if they have not already done so to contact the registration department to sign all necessary forms in order for us  to release information regarding their care.   Consent: Patient/Guardian gives verbal consent for treatment and assignment of benefits for services provided during this visit. Patient/Guardian expressed understanding and agreed to proceed.    Allison Bram, NP 04/16/2024, 9:49 AM

## 2024-04-20 ENCOUNTER — Other Ambulatory Visit

## 2024-04-20 DIAGNOSIS — R7989 Other specified abnormal findings of blood chemistry: Secondary | ICD-10-CM

## 2024-04-20 LAB — BASIC METABOLIC PANEL WITH GFR
BUN/Creatinine Ratio: 10 (ref 9–23)
BUN: 10 mg/dL (ref 6–24)
CO2: 19 mmol/L — ABNORMAL LOW (ref 20–29)
Calcium: 10.2 mg/dL (ref 8.7–10.2)
Chloride: 105 mmol/L (ref 96–106)
Creatinine, Ser: 1.02 mg/dL — ABNORMAL HIGH (ref 0.57–1.00)
Glucose: 126 mg/dL — ABNORMAL HIGH (ref 70–99)
Potassium: 4.4 mmol/L (ref 3.5–5.2)
Sodium: 140 mmol/L (ref 134–144)
eGFR: 70 mL/min/{1.73_m2} (ref >59)

## 2024-04-23 ENCOUNTER — Ambulatory Visit: Payer: Self-pay | Admitting: Family Medicine

## 2024-04-26 ENCOUNTER — Other Ambulatory Visit: Payer: Self-pay | Admitting: Family Medicine

## 2024-04-26 DIAGNOSIS — E119 Type 2 diabetes mellitus without complications: Secondary | ICD-10-CM

## 2024-04-26 MED ORDER — ENALAPRIL MALEATE 2.5 MG PO TABS
2.5000 mg | ORAL_TABLET | Freq: Every day | ORAL | 0 refills | Status: DC
Start: 2024-04-26 — End: 2024-09-03

## 2024-04-26 MED ORDER — METFORMIN HCL 850 MG PO TABS: 850.0000 mg | ORAL_TABLET | Freq: Two times a day (BID) | ORAL | 0 refills | Status: DC

## 2024-04-26 NOTE — Telephone Encounter (Signed)
 RN updated pt's pharmacy to reflect her preferred pharmacy.

## 2024-04-26 NOTE — Telephone Encounter (Signed)
 Copied from CRM [id number]. Topic: Clinical - Medication Refill >> Apr 26, 2024  9:24 AM [place] B wrote: Medication: enalapril  (VASOTEC ) 2.5 MG tablet/metFORMIN  (GLUCOPHAGE ) 850 MG tablet  Has the patient contacted their pharmacy? yes (Agent: If yes, when and what did the pharmacy advise?) Pharmacy called in directly  This is the patient's preferred pharmacy:   [facility] [place] [place] [address] [place] [address] Phone: [phone] Fax: [phone]  Is this the correct pharmacy for this prescription? yes   Has the prescription been filled recently? no  Is the patient out of the medication? Caller isnt sure about this  Has the patient been seen for an appointment in the last year OR does the patient have an upcoming appointment? yes  Can we respond through MyChart? yes  Agent: Please be advised that Rx refills may take up to 3 business days. We ask that you follow-up with your pharmacy.

## 2024-04-30 ENCOUNTER — Encounter (HOSPITAL_COMMUNITY): Payer: Self-pay | Admitting: Registered Nurse

## 2024-04-30 ENCOUNTER — Other Ambulatory Visit: Payer: Self-pay

## 2024-04-30 ENCOUNTER — Ambulatory Visit (INDEPENDENT_AMBULATORY_CARE_PROVIDER_SITE_OTHER): Admitting: Registered Nurse

## 2024-04-30 DIAGNOSIS — G4700 Insomnia, unspecified: Secondary | ICD-10-CM

## 2024-04-30 DIAGNOSIS — F444 Conversion disorder with motor symptom or deficit: Secondary | ICD-10-CM | POA: Diagnosis not present

## 2024-04-30 DIAGNOSIS — F332 Major depressive disorder, recurrent severe without psychotic features: Secondary | ICD-10-CM | POA: Diagnosis not present

## 2024-04-30 DIAGNOSIS — F411 Generalized anxiety disorder: Secondary | ICD-10-CM | POA: Diagnosis not present

## 2024-04-30 MED ORDER — HYDROXYZINE HCL 10 MG PO TABS: ORAL_TABLET | ORAL | 0 refills | Status: DC

## 2024-04-30 MED ORDER — LINACLOTIDE 290 MCG PO CAPS: 290.0000 ug | ORAL_CAPSULE | Freq: Every day | ORAL | 1 refills | Status: DC

## 2024-04-30 MED ORDER — GUANFACINE HCL 1 MG PO TABS: 1.0000 mg | ORAL_TABLET | Freq: Every day | ORAL | 3 refills | Status: DC

## 2024-04-30 MED ORDER — FLUOXETINE HCL 20 MG PO CAPS
20.0000 mg | ORAL_CAPSULE | Freq: Every day | ORAL | 3 refills | Status: DC
Start: 2024-04-30 — End: 2024-05-21

## 2024-04-30 MED ORDER — BUSPIRONE HCL 5 MG PO TABS: 5.0000 mg | ORAL_TABLET | Freq: Two times a day (BID) | ORAL | 0 refills | Status: DC

## 2024-04-30 NOTE — Progress Notes (Signed)
 [facility] MD/PA/NP OP Progress Note  04/30/2024 2:31 PM MANJOT BEUMER  MRN:  [id number]   Humberto Magnus, NP   Chief Complaint:  Chief Complaint  Patient presents with   Follow-up    Medication management   HPI: Allison Galloway 43 y.o. female presents to office today for medication management follow up.  She is seen face-to-face by this provider, and chart reviewed on 04/30/24.  Her psychiatric history is significant for major depressive disorder, general anxiety, and conversion disorder with abnormal movement.  Her mental health is currently managed with Prozac  20 mg daily, Guaifenesin 1 mg nightly, and Vistaril  10-20 mg 3 times daily as needed.  She reports current medication regimen is effectively managing her mental health.  She reports she feels much, great decrease in verbal and physical tics, and able to get out and do things.  Today she denies suicidal/self-harm/homicidal ideation, psychosis, paranoia, mood swings, and abnormal movements.  She reports she is eating and sleeping without difficulty  Recommended the following: Continue Prozac  20 mg daily, Vistaril  10-20 mg 3 times daily as needed and 10-30 mg nightly as needed sleep, and guanfacine  1 mg nightly.  Start BuSpar  5 mg twice daily.  She is Informed of side effect/efficacy profile on BuSpar .  She voices understanding with information being given to her today and is agreeable to recommendations.    Visit Diagnosis:    ICD-10-CM   1. Severe episode of recurrent major depressive disorder, without psychotic features (HCC)  F33.2 FLUoxetine  (PROZAC ) 20 MG capsule    2. GAD (generalized anxiety disorder)  F41.1 FLUoxetine  (PROZAC ) 20 MG capsule    hydrOXYzine  (ATARAX ) 10 MG tablet    busPIRone  (BUSPAR ) 5 MG tablet    3. Conversion disorder with abnormal movement  F44.4 FLUoxetine  (PROZAC ) 20 MG capsule    guanFACINE  (TENEX ) 1 MG tablet    4. Insomnia, unspecified type  G47.00 hydrOXYzine  (ATARAX ) 10 MG tablet     Past Psychiatric  History: Depression, anxiety conversion disorder   Past Medical History:  Past Medical History:  Diagnosis Date   Abnormal vaginal bleeding 2024   Anxiety    Follows w/ PCP   Arthritis    left knee   Chronic low back pain with bilateral sciatica    Constipation    Follows with [facility] for chronic constipation.   Conversion disorder    speech difficulty, tremors, weakness, negative cardiac, pulmonary & neuro imaging workup. Patient states all symptoms were thought to be related to conversion disorder.   Delta-9-tetrahydrocannabinol (THC) dependence (HCC)    Depression    Follows w/ PCP.   Facial weakness 2023   left-sided facial weakiness, speech difficulty w/ stuttering, left-sided weakness / Saw neurologist,Dr. Royce Coria on 03/15/22 in [facility].   Headache    hx of migraines   SOB (shortness of breath)    Former smoker. Follows w/ Dr. Diania Fortes pulmonology and Dr. Alison Irvine, cardiology. 03/03/22 Echo within normal limits, EF 65 -70%. Patient states SOB r/t panic attacks.   Type 2 diabetes mellitus (HCC)    Follows w/ PCP, Evalyn Hillier, FNP.   Wears glasses     Past Surgical History:  Procedure Laterality Date   ABDOMINAL HYSTERECTOMY     CESAREAN SECTION     3   CYSTOSCOPY N/A 07/05/2023   Procedure: CYSTOSCOPY;  Surgeon: Kiki Pelton, MD;  Location: [facility];  Service: Gynecology;  Laterality: N/A;   KNEE SURGERY Left    x 3   ROBOTIC  ASSISTED TOTAL HYSTERECTOMY WITH BILATERAL SALPINGO OOPHERECTOMY Bilateral 07/05/2023   Procedure: XI ROBOTIC ASSISTED TOTAL LAPRASCOPIC HYSTERECTOMY WITH BILATERAL SALPINGECTOMY,LYSIS OF ACHESIONS, TAP BLOCK;  Surgeon: Kiki Pelton, MD;  Location: [facility];  Service: Gynecology;  Laterality: Bilateral;   TUBAL LIGATION  2006    Family Psychiatric History: See below and family history  Family History:  Family History  Problem Relation Age of Onset   Bipolar disorder Mother    Anxiety  disorder Mother    Diabetes Mother    Depression Mother    Arthritis Mother    Asthma Mother    Learning disabilities Mother    Mental illness Mother    Anxiety disorder Sister    Depression Sister    Asthma Sister    Mental illness Sister    Seizures Daughter    Hypertension Other    Bipolar disorder Other    Anxiety disorder Other    Depression Other     Social History:  Social History   Socioeconomic History   Marital status: Married    Spouse name: Not on file   Number of children: 3   Years of education: GED   Highest education level: GED or equivalent  Occupational History   Not on file  Tobacco Use   Smoking status: Former    Current packs/day: 0.00    Types: Cigarettes    Quit date: 08/2021    Years since quitting: 2.6   Smokeless tobacco: Never  Vaping Use   Vaping status: Every Day   Substances: Nicotine, THC, Flavoring  Substance and Sexual Activity   Alcohol  use: No   Drug use: Yes    Types: Marijuana    Comment: delta 8 daily use   Sexual activity: Yes    Partners: Male    Birth control/protection: Surgical    Comment: tubal, menarche 43yo, sexual debut 43yo  Other Topics Concern   Not on file  Social History Narrative   Not on file   Social Drivers of Health   Financial Resource Strain: Low Risk  (04/04/2024)   Overall Financial Resource Strain (CARDIA)    Difficulty of Paying Living Expenses: Not very hard  Food Insecurity: Food Insecurity Present (04/04/2024)   Hunger Vital Sign    Worried About Running Out of Food in the Last Year: Sometimes true    Ran Out of Food in the Last Year: Never true  Transportation Needs: No Transportation Needs (04/04/2024)   PRAPARE - Administrator, Civil Service (Medical): No    Lack of Transportation (Non-Medical): No  Physical Activity: Unknown (04/04/2024)   Exercise Vital Sign    Days of Exercise per Week: Patient declined    Minutes of Exercise per Session: Not on file  Stress: Stress  Concern Present (04/04/2024)   [employer] of Occupational Health - Occupational Stress Questionnaire    Feeling of Stress : Very much  Social Connections: Socially Isolated (04/04/2024)   Social Connection and Isolation Panel [NHANES]    Frequency of Communication with Friends and Family: Twice a week    Frequency of Social Gatherings with Friends and Family: Never    Attends Religious Services: Never    Database administrator or Organizations: No    Attends Engineer, structural: Not on file    Marital Status: Married    Allergies: No Known Allergies  Metabolic Disorder Labs: Lab Results  Component Value Date   HGBA1C 6.6 (H) 04/04/2024   Lab  Results  Component Value Date   PROLACTIN 11.0 06/22/2022   Lab Results  Component Value Date   CHOL 182 04/04/2024   TRIG 264 (H) 04/04/2024   HDL 35 (L) 04/04/2024   CHOLHDL 5.2 (H) 04/04/2024   LDLCALC 102 (H) 04/04/2024   LDLCALC 97 02/11/2023   Lab Results  Component Value Date   TSH 0.906 04/04/2024   TSH 0.469 10/29/2022   Current Medications: Current Outpatient Medications  Medication Sig Dispense Refill   blood glucose meter kit and supplies Dispense based on patient and insurance preference. Use up to four times daily as directed. (FOR ICD-10 E10.9, E11.9). 1 each 0   busPIRone  (BUSPAR ) 5 MG tablet Take 1 tablet (5 mg total) by mouth 2 (two) times daily. 60 tablet 0   enalapril  (VASOTEC ) 2.5 MG tablet Take 1 tablet (2.5 mg total) by mouth daily. 90 tablet 0   ferrous sulfate  325 (65 FE) MG EC tablet Take 1 tablet (325 mg total) by mouth every other day. 45 tablet 3   linaclotide  (LINZESS ) 290 MCG CAPS capsule Take 1 capsule (290 mcg total) by mouth daily before breakfast. 90 capsule 2   metFORMIN  (GLUCOPHAGE ) 850 MG tablet Take 1 tablet (850 mg total) by mouth 2 (two) times daily with a meal. 180 tablet 0   norethindrone  (AYGESTIN ) 5 MG tablet Take 2 tablets (10 mg total) by mouth daily. 60 tablet 2    ofloxacin (FLOXIN) 0.3 % OTIC solution 5 drops 2 (two) times daily.     FLUoxetine  (PROZAC ) 20 MG capsule Take 1 capsule (20 mg total) by mouth daily. 30 capsule 3   guanFACINE  (TENEX ) 1 MG tablet Take 1 tablet (1 mg total) by mouth at bedtime. 30 tablet 3   hydrOXYzine  (ATARAX ) 10 MG tablet Take 10-20 mg (1-2 tablets) three times a day as needed for anxiety.  Take 10-30 mg (1-3 tablets) as needed for sleep 90 tablet 0   No current facility-administered medications for this visit.     Musculoskeletal: Strength & Muscle Tone: Unable to assess via virtual visit Gait & Station: Unable to assess via virtual visit Patient leans: N/A  Psychiatric Specialty Exam: Review of Systems  Constitutional:        No other complaints voiced at this time  Psychiatric/Behavioral:  Positive for dysphoric mood (Stable) and sleep disturbance (Stable). Negative for hallucinations, self-injury and suicidal ideas. The patient is nervous/anxious (Continues to improve).        Reporting improvement in involuntary movement and vocalization  All other systems reviewed and are negative.   Blood pressure 106/66, pulse 60, height 5\' 2"  (1.575 m), weight 162 lb 12.8 oz (73.8 kg), last menstrual period 10/02/2022, SpO2 98%.Body mass index is 29.78 kg/m.  General Appearance: Casual  Eye Contact:  Good  Speech:  Clear and Coherent and Normal Rate  Volume:  Normal  Mood:  Euthymic  Affect:  Appropriate and Congruent  Thought Process:  Coherent, Goal Directed, and Descriptions of Associations: Intact  Orientation:  Full (Time, Place, and Person)  Thought Content: Logical   Suicidal Thoughts:  No  Homicidal Thoughts:  No  Memory:  Immediate;   Good Recent;   Good Remote;   Good  Judgement:  Intact  Insight:  Present  Psychomotor Activity:  repetitive, involuntary movements and vocalizations  Concentration:  Concentration: Good and Attention Span: Good  Recall:  Good  Fund of Knowledge: Good  Language: Good   Akathisia:  No  Handed:  Right  AIMS (  if indicated): not done  Assets:  Communication Skills Desire for Improvement Financial Resources/Insurance Housing Intimacy Leisure Time Resilience Social Support Transportation  ADL's:  Intact  Cognition: WNL  Sleep:  Good   Screenings: Geneticist, molecular Office Visit from 04/02/2024 in [facility] [place]  AIMS Total Score 12      GAD-7    Flowsheet Row Office Visit from 04/04/2024 in [facility] Office Visit from 04/02/2024 in [facility] [place] Office Visit from 01/20/2024 in [facility] Office Visit from 11/10/2023 in [facility] Office Visit from 08/16/2023 in [facility]  Total GAD-7 Score 18 19 14 10 8       PHQ2-9    Flowsheet Row Office Visit from 04/04/2024 in [facility] Office Visit from 04/02/2024 in [facility] [place] Office Visit from 01/20/2024 in [facility] Office Visit from 11/10/2023 in [facility] Office Visit from 08/16/2023 in [facility]  PHQ-2 Total Score 5 5 4 3 3   PHQ-9 Total Score 17 19 19 14 15       Flowsheet Row Office Visit from 04/02/2024 in [facility] [place] Admission (Discharged) from 07/05/2023 in [facility] ED from 05/06/2023 in [facility]  C-SSRS RISK CATEGORY No Risk No Risk No Risk        Assessment and Plan:  Assessment: Patient seen and examined as noted above. Summary: Today Allison Galloway appears to be doing well.  She reports current medications are effective and managing her mental health without adverse reaction.  Reports anxiety continues  to improve but still feels it could be better.  Discussed starting BuSpar  5 mg twice daily she agreed to trial of BuSpar .  Today she denies suicidal/self-harm/homicidal ideations, psychosis, paranoia, mood fluctuations. During visit she is dressed appropriate for age and weather.  She is seated comfortably in view of camera with no noted distress.  She is alert/oriented x 4, calm/cooperative and mood is congruent with affect.  She spoke in a clear tone at moderate volume, and normal pace, with good eye contact.  Her thought process is coherent, relevant, and there is no indication that she is currently responding to internal/external stimuli or experiencing delusional thought content.    1. Severe episode of recurrent major depressive disorder, without psychotic features (HCC) (Primary) - FLUoxetine  (PROZAC ) 20 MG capsule; Take 1 capsule (20 mg total) by mouth daily.  Dispense: 30 capsule; Refill: 0 1. Severe episode of recurrent major depressive disorder, without psychotic features (HCC) - FLUoxetine  (PROZAC ) 20 MG capsule; Take 1 capsule (20 mg total) by mouth daily.  Dispense: 30 capsule; Refill: 3  2. GAD (generalized anxiety disorder) - FLUoxetine  (PROZAC ) 20 MG capsule; Take 1 capsule (20 mg total) by mouth daily.  Dispense: 30 capsule; Refill: 3 - hydrOXYzine  (ATARAX ) 10 MG tablet; Take 10-20 mg (1-2 tablets) three times a day as needed for anxiety.  Take 10-30 mg (1-3 tablets) as needed for sleep  Dispense: 90 tablet; Refill: 0 - busPIRone  (BUSPAR ) 5 MG tablet; Take 1 tablet (5 mg total) by mouth 2 (two) times daily.  Dispense: 60 tablet; Refill: 0  3. Conversion disorder with abnormal movement - FLUoxetine  (PROZAC ) 20 MG capsule; Take 1 capsule (20  mg total) by mouth daily.  Dispense: 30 capsule; Refill: 3 - guanFACINE  (TENEX ) 1 MG tablet; Take 1 tablet (1 mg total) by mouth at bedtime.  Dispense: 30 tablet; Refill: 3  4. Insomnia, unspecified type - hydrOXYzine  (ATARAX ) 10 MG tablet; Take  10-20 mg (1-2 tablets) three times a day as needed for anxiety.  Take 10-30 mg (1-3 tablets) as needed for sleep  Dispense: 90 tablet; Refill: 0    Plan: Medications: Meds ordered this encounter  Medications   FLUoxetine  (PROZAC ) 20 MG capsule    Sig: Take 1 capsule (20 mg total) by mouth daily.    Dispense:  30 capsule    Refill:  3    Supervising Provider:   ARFEEN, SYED T [[id number]]   hydrOXYzine  (ATARAX ) 10 MG tablet    Sig: Take 10-20 mg (1-2 tablets) three times a day as needed for anxiety.  Take 10-30 mg (1-3 tablets) as needed for sleep    Dispense:  90 tablet    Refill:  0    Supervising Provider:   Carlos Chesterfield, SYED T [[id number]]   guanFACINE  (TENEX ) 1 MG tablet    Sig: Take 1 tablet (1 mg total) by mouth at bedtime.    Dispense:  30 tablet    Refill:  3    Supervising Provider:   ARFEEN, SYED T [[id number]]   busPIRone  (BUSPAR ) 5 MG tablet    Sig: Take 1 tablet (5 mg total) by mouth 2 (two) times daily.    Dispense:  60 tablet    Refill:  0    Supervising Provider:   Arturo Late [[id number]]    Labs:  Not indicated at this time  Other:  Keep scheduled appointment for counseling/therapy in August 2025    NUR RABOLD is instructed to call [phone], mobile crisis, or present to the nearest emergency room should she experience any suicidal/homicidal ideation, auditory/visual/hallucinations, or detrimental worsening of her mental health condition.   Allison Galloway participated in the development of this treatment plan and verbalized her agreement with plan as listed.  Follow Up: Return in 2 weeks for medication management follow up Call in the interim for any side-effects, decompensation, questions, or problems  Collaboration of Care: Collaboration of Care: Medication Management AEB medication assessment, adjustment, refills, started BuSpar   Patient/Guardian was advised Release of Information must be obtained prior to any record release in order to collaborate their care with an outside  provider. Patient/Guardian was advised if they have not already done so to contact the registration department to sign all necessary forms in order for us  to release information regarding their care.   Consent: Patient/Guardian gives verbal consent for treatment and assignment of benefits for services provided during this visit. Patient/Guardian expressed understanding and agreed to proceed.    Edythe Riches, NP 04/30/2024, 2:31 PM

## 2024-04-30 NOTE — Telephone Encounter (Signed)
 [facility]  refill approved by Santina Cull PA and sent to [facility].

## 2024-04-30 NOTE — Patient Instructions (Signed)

## 2024-05-10 DIAGNOSIS — Z419 Encounter for procedure for purposes other than remedying health state, unspecified: Secondary | ICD-10-CM | POA: Diagnosis not present

## 2024-05-10 DIAGNOSIS — J209 Acute bronchitis, unspecified: Secondary | ICD-10-CM | POA: Diagnosis not present

## 2024-05-21 ENCOUNTER — Ambulatory Visit (INDEPENDENT_AMBULATORY_CARE_PROVIDER_SITE_OTHER): Admitting: Registered Nurse

## 2024-05-21 ENCOUNTER — Encounter (HOSPITAL_COMMUNITY): Payer: Self-pay | Admitting: Registered Nurse

## 2024-05-21 VITALS — BP 111/74 | HR 80 | Ht 62.0 in | Wt 161.2 lb

## 2024-05-21 DIAGNOSIS — F411 Generalized anxiety disorder: Secondary | ICD-10-CM

## 2024-05-21 DIAGNOSIS — F444 Conversion disorder with motor symptom or deficit: Secondary | ICD-10-CM | POA: Diagnosis not present

## 2024-05-21 DIAGNOSIS — G4700 Insomnia, unspecified: Secondary | ICD-10-CM | POA: Diagnosis not present

## 2024-05-21 DIAGNOSIS — F332 Major depressive disorder, recurrent severe without psychotic features: Secondary | ICD-10-CM | POA: Diagnosis not present

## 2024-05-21 MED ORDER — GUANFACINE HCL 1 MG PO TABS
1.0000 mg | ORAL_TABLET | Freq: Every day | ORAL | 3 refills | Status: DC
Start: 2024-05-21 — End: 2024-07-04

## 2024-05-21 MED ORDER — HYDROXYZINE HCL 10 MG PO TABS
ORAL_TABLET | ORAL | 3 refills | Status: DC
Start: 2024-05-21 — End: 2024-07-04

## 2024-05-21 MED ORDER — FLUOXETINE HCL 20 MG PO CAPS: 20.0000 mg | ORAL_CAPSULE | Freq: Every day | ORAL | 3 refills | Status: DC

## 2024-05-21 MED ORDER — BUSPIRONE HCL 5 MG PO TABS: 5.0000 mg | ORAL_TABLET | Freq: Two times a day (BID) | ORAL | 3 refills | Status: DC

## 2024-05-21 NOTE — Patient Instructions (Signed)

## 2024-05-21 NOTE — Progress Notes (Signed)
 BH MD/PA/Allison Galloway OP Progress Note  05/21/2024 2:05 PM Allison Galloway  MRN:  [id number]   Allison Ruder, Allison Galloway   Chief Complaint:  Chief Complaint  Patient presents with   Follow-up    Medication management   HPI: Allison Galloway 43 y.o. female presents to office today for medication management follow up.  She is seen face-to-face by this provider, and chart reviewed on 05/21/24.  Her psychiatric history is significant for major depressive disorder, general anxiety, and conversion disorder with abnormal movement.  Her mental health is currently managed with Prozac  20 mg daily, Guaifenesin 1 mg nightly, Buspar  5 mg twice daily, and Vistaril  10-20 mg 3 times daily as needed.  She reports current medication regimen is effectively managing her mental health without adverse reaction.  She reports she continues to improve and feel better.  Reports there are no verbal/physical tic.  I am going to the neighbors house everyday just sitting talking, and laughing.  I'm getting out more and enjoying life.  Reports there has been an improvement in sleep and eating without difficulty.  She denies suicidal/self-harm/homicidal ideation, psychosis, paranoia, mood swings, and abnormal movements.     Recommended the following: Continue Prozac  20 mg daily, Vistaril  10-20 mg 3 times daily as needed and 10-30 mg nightly as needed sleep, Guanfacine  1 mg nightly, and BuSpar  5 mg twice daily.  She voices understanding with information being given to her today and is agreeable to recommendations.    Visit Diagnosis:    ICD-10-CM   1. Conversion disorder with abnormal movement  F44.4 FLUoxetine  (PROZAC ) 20 MG capsule    guanFACINE  (TENEX ) 1 MG tablet    2. Severe episode of recurrent major depressive disorder, without psychotic features (HCC)  F33.2 FLUoxetine  (PROZAC ) 20 MG capsule    3. GAD (generalized anxiety disorder)  F41.1 FLUoxetine  (PROZAC ) 20 MG capsule    busPIRone  (BUSPAR ) 5 MG tablet    hydrOXYzine  (ATARAX ) 10 MG  tablet    4. Insomnia, unspecified type  G47.00 hydrOXYzine  (ATARAX ) 10 MG tablet      Past Psychiatric History: Depression, anxiety conversion disorder   Past Medical History:  Past Medical History:  Diagnosis Date   Abnormal vaginal bleeding 2024   Anxiety    Follows w/ PCP   Arthritis    left knee   Chronic low back pain with bilateral sciatica    Constipation    Follows with [facility] for chronic constipation.   Conversion disorder    speech difficulty, tremors, weakness, negative cardiac, pulmonary & neuro imaging workup. Patient states all symptoms were thought to be related to conversion disorder.   Delta-9-tetrahydrocannabinol (THC) dependence (HCC)    Depression    Follows w/ PCP.   Facial weakness 2023   left-sided facial weakiness, speech difficulty w/ stuttering, left-sided weakness / Saw neurologist,Dr. Anastasia Popp on 03/15/22 in Epic.   Headache    hx of migraines   SOB (shortness of breath)    Former smoker. Follows w/ Dr. Kara pulmonology and Dr. Lorrane, cardiology. 03/03/22 Echo within normal limits, EF 65 -70%. Patient states SOB r/t panic attacks.   Type 2 diabetes mellitus (HCC)    Follows w/ PCP, Rock Bruns, FNP.   Wears glasses     Past Surgical History:  Procedure Laterality Date   ABDOMINAL HYSTERECTOMY     CESAREAN SECTION     3   CYSTOSCOPY N/A 07/05/2023   Procedure: CYSTOSCOPY;  Surgeon: Jeralyn Crutch, MD;  Location: [facility];  Service: Gynecology;  Laterality: N/A;   KNEE SURGERY Left    x 3   ROBOTIC ASSISTED TOTAL HYSTERECTOMY WITH BILATERAL SALPINGO OOPHERECTOMY Bilateral 07/05/2023   Procedure: XI ROBOTIC ASSISTED TOTAL LAPRASCOPIC HYSTERECTOMY WITH BILATERAL SALPINGECTOMY,LYSIS OF ACHESIONS, TAP BLOCK;  Surgeon: Jeralyn Crutch, MD;  Location: [facility];  Service: Gynecology;  Laterality: Bilateral;   TUBAL LIGATION  2006    Family Psychiatric History: See below and family  history  Family History:  Family History  Problem Relation Age of Onset   Bipolar disorder Mother    Anxiety disorder Mother    Diabetes Mother    Depression Mother    Arthritis Mother    Asthma Mother    Learning disabilities Mother    Mental illness Mother    Anxiety disorder Sister    Depression Sister    Asthma Sister    Mental illness Sister    Seizures Daughter    Hypertension Other    Bipolar disorder Other    Anxiety disorder Other    Depression Other     Social History:  Social History   Socioeconomic History   Marital status: Married    Spouse name: Not on file   Number of children: 3   Years of education: GED   Highest education level: GED or equivalent  Occupational History   Not on file  Tobacco Use   Smoking status: Former    Current packs/day: 0.00    Types: Cigarettes    Quit date: 08/2021    Years since quitting: 2.7   Smokeless tobacco: Never  Vaping Use   Vaping status: Every Day   Substances: Nicotine, THC, Flavoring  Substance and Sexual Activity   Alcohol  use: No   Drug use: Yes    Types: Marijuana    Comment: delta 8 daily use   Sexual activity: Yes    Partners: Male    Birth control/protection: Surgical    Comment: tubal, menarche 43yo, sexual debut 43yo  Other Topics Concern   Not on file  Social History Narrative   Not on file   Social Drivers of Health   Financial Resource Strain: Low Risk  (04/04/2024)   Overall Financial Resource Strain (CARDIA)    Difficulty of Paying Living Expenses: Not very hard  Food Insecurity: Food Insecurity Present (04/04/2024)   Hunger Vital Sign    Worried About Running Out of Food in the Last Year: Sometimes true    Ran Out of Food in the Last Year: Never true  Transportation Needs: No Transportation Needs (04/04/2024)   PRAPARE - Administrator, Civil Service (Medical): No    Lack of Transportation (Non-Medical): No  Physical Activity: Unknown (04/04/2024)   Exercise Vital Sign     Days of Exercise per Week: Patient declined    Minutes of Exercise per Session: Not on file  Stress: Stress Concern Present (04/04/2024)   [employer] of Occupational Health - Occupational Stress Questionnaire    Feeling of Stress : Very much  Social Connections: Socially Isolated (04/04/2024)   Social Connection and Isolation Panel    Frequency of Communication with Friends and Family: Twice a week    Frequency of Social Gatherings with Friends and Family: Never    Attends Religious Services: Never    Database administrator or Organizations: No    Attends Engineer, structural: Not on file    Marital Status: Married    Allergies: No Known Allergies  Metabolic Disorder Labs: Lab Results  Component Value Date   HGBA1C 6.6 (H) 04/04/2024   Lab Results  Component Value Date   PROLACTIN 11.0 06/22/2022   Lab Results  Component Value Date   CHOL 182 04/04/2024   TRIG 264 (H) 04/04/2024   HDL 35 (L) 04/04/2024   CHOLHDL 5.2 (H) 04/04/2024   LDLCALC 102 (H) 04/04/2024   LDLCALC 97 02/11/2023   Lab Results  Component Value Date   TSH 0.906 04/04/2024   TSH 0.469 10/29/2022   Current Medications: Current Outpatient Medications  Medication Sig Dispense Refill   blood glucose meter kit and supplies Dispense based on patient and insurance preference. Use up to four times daily as directed. (FOR ICD-10 E10.9, E11.9). 1 each 0   enalapril  (VASOTEC ) 2.5 MG tablet Take 1 tablet (2.5 mg total) by mouth daily. 90 tablet 0   ferrous sulfate  325 (65 FE) MG EC tablet Take 1 tablet (325 mg total) by mouth every other day. 45 tablet 3   linaclotide  (LINZESS ) 290 MCG CAPS capsule Take 1 capsule (290 mcg total) by mouth daily before breakfast. 90 capsule 1   metFORMIN  (GLUCOPHAGE ) 850 MG tablet Take 1 tablet (850 mg total) by mouth 2 (two) times daily with a meal. 180 tablet 0   norethindrone  (AYGESTIN ) 5 MG tablet Take 2 tablets (10 mg total) by mouth daily. 60 tablet 2   ofloxacin  (FLOXIN) 0.3 % OTIC solution 5 drops 2 (two) times daily.     busPIRone  (BUSPAR ) 5 MG tablet Take 1 tablet (5 mg total) by mouth 2 (two) times daily. 60 tablet 3   FLUoxetine  (PROZAC ) 20 MG capsule Take 1 capsule (20 mg total) by mouth daily. 30 capsule 3   guanFACINE  (TENEX ) 1 MG tablet Take 1 tablet (1 mg total) by mouth at bedtime. 30 tablet 3   hydrOXYzine  (ATARAX ) 10 MG tablet Take 10-20 mg (1-2 tablets) three times a day as needed for anxiety.  Take 10-30 mg (1-3 tablets) as needed for sleep 90 tablet 3   No current facility-administered medications for this visit.     Musculoskeletal: Strength & Muscle Tone: Unable to assess via virtual visit Gait & Station: Unable to assess via virtual visit Patient leans: N/A  Psychiatric Specialty Exam: Review of Systems  Constitutional:        No other complaints voiced at this time  Psychiatric/Behavioral:  Positive for dysphoric mood (Stable) and sleep disturbance (Stable). Negative for hallucinations, self-injury and suicidal ideas. The patient is nervous/anxious (Continues to improve).        Reports no involuntary movement and vocalization  All other systems reviewed and are negative.   Blood pressure 111/74, pulse 80, height 5' 2 (1.575 m), weight 161 lb 3.2 oz (73.1 kg), last menstrual period 10/02/2022, SpO2 95%.Body mass index is 29.48 kg/m.  General Appearance: Casual  Eye Contact:  Good  Speech:  Clear and Coherent and Normal Rate  Volume:  Normal  Mood:  Euthymic  Affect:  Appropriate and Congruent  Thought Process:  Coherent, Goal Directed, and Descriptions of Associations: Intact  Orientation:  Full (Time, Place, and Person)  Thought Content: Logical   Suicidal Thoughts:  No  Homicidal Thoughts:  No  Memory:  Immediate;   Good Recent;   Good Remote;   Good  Judgement:  Intact  Insight:  Present  Psychomotor Activity:  repetitive, involuntary movements and vocalizations  Concentration:  Concentration: Good and  Attention Span: Good  Recall:  Good  Fund of Knowledge: Good  Language: Good  Akathisia:  No  Handed:  Right  AIMS (if indicated): not done  Assets:  Communication Skills Desire for Improvement Financial Resources/Insurance Housing Intimacy Leisure Time Resilience Social Support Transportation  ADL's:  Intact  Cognition: WNL  Sleep:  Good   Screenings: Geneticist, molecular Office Visit from 04/02/2024 in [facility] [place]  AIMS Total Score 12   GAD-7    Flowsheet Row Office Visit from 05/21/2024 in [facility] [place] Office Visit from 04/04/2024 in [facility] Office Visit from 04/02/2024 in [facility] [place] Office Visit from 01/20/2024 in [facility] Office Visit from 11/10/2023 in [facility]  Total GAD-7 Score 8 18 19 14 10    PHQ2-9    Flowsheet Row Office Visit from 05/21/2024 in [facility] [place] Office Visit from 04/04/2024 in [facility] Office Visit from 04/02/2024 in [facility] [place] Office Visit from 01/20/2024 in [facility] Office Visit from 11/10/2023 in [facility]  PHQ-2 Total Score 1 5 5 4 3   PHQ-9 Total Score 5 17 19 19 14    Flowsheet Row Office Visit from 05/21/2024 in [facility] [place] Office Visit from 04/02/2024 in [facility] [place] Admission (Discharged) from 07/05/2023 in [facility]  C-SSRS RISK CATEGORY No Risk No Risk No Risk     Assessment and Plan:  Assessment: Patient seen and examined as noted above. Summary: Today Allison Galloway appears to be doing well.  She reports current medications are effective  and managing her mental health without adverse reaction.  Reports continued improvement and able to enjoy life without verbal/physical tics and improvement in depression and anxiety.  Today she denies suicidal/self-harm/homicidal ideations, psychosis, paranoia, mood fluctuations. During visit she is dressed appropriate for age and weather.  She is seated comfortably in view of camera with no noted distress.  She is alert/oriented x 4, calm/cooperative and mood is congruent with affect.  She spoke in a clear tone at moderate volume, and normal pace, with good eye contact.  Her thought process is coherent, relevant, and there is no indication that she is currently responding to internal/external stimuli or experiencing delusional thought content.    1. Severe episode of recurrent major depressive disorder, without psychotic features (HCC) - FLUoxetine  (PROZAC ) 20 MG capsule; Take 1 capsule (20 mg total) by mouth daily.  Dispense: 30 capsule; Refill: 3  2. GAD (generalized anxiety disorder) - FLUoxetine  (PROZAC ) 20 MG capsule; Take 1 capsule (20 mg total) by mouth daily.  Dispense: 30 capsule; Refill: 3 - busPIRone  (BUSPAR ) 5 MG tablet; Take 1 tablet (5 mg total) by mouth 2 (two) times daily.  Dispense: 60 tablet; Refill: 3 - hydrOXYzine  (ATARAX ) 10 MG tablet; Take 10-20 mg (1-2 tablets) three times a day as needed for anxiety.  Take 10-30 mg (1-3 tablets) as needed for sleep  Dispense: 90 tablet; Refill: 3  3. Conversion disorder with abnormal movement (Primary) - FLUoxetine  (PROZAC ) 20 MG capsule; Take 1 capsule (20 mg total) by mouth daily.  Dispense: 30 capsule; Refill: 3 - guanFACINE  (TENEX ) 1 MG tablet; Take 1 tablet (1 mg total) by mouth at bedtime.  Dispense: 30 tablet; Refill: 3  4.  Insomnia, unspecified type - hydrOXYzine  (ATARAX ) 10 MG tablet; Take 10-20 mg (1-2 tablets) three times a day as needed for anxiety.  Take 10-30 mg (1-3 tablets) as needed for sleep  Dispense: 90 tablet; Refill: 3    Plan: Medications: Meds ordered this encounter  Medications   FLUoxetine  (PROZAC ) 20 MG capsule    Sig: Take 1 capsule (20 mg total) by mouth daily.    Dispense:  30 capsule    Refill:  3    Supervising Provider:   ARFEEN, SYED T [[id number]]   busPIRone  (BUSPAR ) 5 MG tablet    Sig: Take 1 tablet (5 mg total) by mouth 2 (two) times daily.    Dispense:  60 tablet    Refill:  3    Supervising Provider:   CURRY, SYED T [[id number]]   guanFACINE  (TENEX ) 1 MG tablet    Sig: Take 1 tablet (1 mg total) by mouth at bedtime.    Dispense:  30 tablet    Refill:  3    Supervising Provider:   CURRY, SYED T [[id number]]   hydrOXYzine  (ATARAX ) 10 MG tablet    Sig: Take 10-20 mg (1-2 tablets) three times a day as needed for anxiety.  Take 10-30 mg (1-3 tablets) as needed for sleep    Dispense:  90 tablet    Refill:  3    Supervising Provider:   CURRY LENI DASEN [[id number]]    Labs:  Not indicated at this time  Other:  Keep scheduled appointment for counseling/therapy in August 2025    Allison Galloway is instructed to call [phone], mobile crisis, or present to the nearest emergency room should she experience any suicidal/homicidal ideation, auditory/visual/hallucinations, or detrimental worsening of her mental health condition.   Allison Galloway participated in the development of this treatment plan and verbalized her agreement with plan as listed.  Follow Up: Return in 3 months for medication management follow up Call in the interim for any side-effects, decompensation, questions, or problems  Collaboration of Care: Collaboration of Care: Medication Management AEB medication assessment, adjustment, refills, started BuSpar   Patient/Guardian was advised Release of Information must be obtained prior to any record release in order to collaborate their care with an outside provider. Patient/Guardian was advised if they have not already done so to contact the registration department to sign all necessary forms in order for  us  to release information regarding their care.   Consent: Patient/Guardian gives verbal consent for treatment and assignment of benefits for services provided during this visit. Patient/Guardian expressed understanding and agreed to proceed.    Allison Moffa, Allison Galloway 05/21/2024, 2:05 PM

## 2024-05-23 ENCOUNTER — Other Ambulatory Visit: Payer: Self-pay | Admitting: Medical Genetics

## 2024-05-25 ENCOUNTER — Other Ambulatory Visit (HOSPITAL_COMMUNITY)
Admission: RE | Admit: 2024-05-25 | Discharge: 2024-05-25 | Disposition: A | Discharge status: Home / Self Care | Payer: Self-pay | Source: Ambulatory Visit | Attending: Oncology | Admitting: Oncology

## 2024-05-26 DIAGNOSIS — F447 Conversion disorder with mixed symptom presentation: Secondary | ICD-10-CM | POA: Diagnosis not present

## 2024-05-30 ENCOUNTER — Encounter: Payer: Self-pay | Admitting: Gastroenterology

## 2024-05-30 ENCOUNTER — Encounter: Payer: Self-pay | Admitting: Oncology

## 2024-05-30 ENCOUNTER — Ambulatory Visit: Admitting: Gastroenterology

## 2024-05-30 VITALS — BP 129/81 | HR 61 | Temp 98.0°F | Resp 19 | Ht 62.0 in | Wt 170.0 lb

## 2024-05-30 DIAGNOSIS — K2950 Unspecified chronic gastritis without bleeding: Secondary | ICD-10-CM | POA: Diagnosis not present

## 2024-05-30 DIAGNOSIS — D509 Iron deficiency anemia, unspecified: Secondary | ICD-10-CM

## 2024-05-30 DIAGNOSIS — D124 Benign neoplasm of descending colon: Secondary | ICD-10-CM | POA: Diagnosis not present

## 2024-05-30 DIAGNOSIS — B9681 Helicobacter pylori [H. pylori] as the cause of diseases classified elsewhere: Secondary | ICD-10-CM | POA: Diagnosis not present

## 2024-05-30 DIAGNOSIS — K640 First degree hemorrhoids: Secondary | ICD-10-CM

## 2024-05-30 DIAGNOSIS — K2289 Other specified disease of esophagus: Secondary | ICD-10-CM

## 2024-05-30 DIAGNOSIS — K5909 Other constipation: Secondary | ICD-10-CM | POA: Diagnosis not present

## 2024-05-30 MED ORDER — SODIUM CHLORIDE 0.9 % IV SOLN: 500.0000 mL | INTRAVENOUS | Status: DC

## 2024-05-30 NOTE — Progress Notes (Unsigned)
 Report given to PACU, vss

## 2024-05-30 NOTE — Patient Instructions (Signed)
 Handouts provided about hemorrhoids, and polyps.  Resume previous diet. MiraLAX  17 g oral daily. Continue present medications. Repeat colonoscopy for surveillance based on pathology results. Likely earlier interval, d/t quality of prep.    YOU HAD AN ENDOSCOPIC PROCEDURE TODAY AT THE [facility]:   Refer to the procedure report that was given to you for any specific questions about what was found during the examination.  If the procedure report does not answer your questions, please call your gastroenterologist to clarify.  If you requested that your care partner not be given the details of your procedure findings, then the procedure report has been included in a sealed envelope for you to review at your convenience later.  YOU SHOULD EXPECT: Some feelings of bloating in the abdomen. Passage of more gas than usual.  Walking can help get rid of the air that was put into your GI tract during the procedure and reduce the bloating. If you had a lower endoscopy (such as a colonoscopy or flexible sigmoidoscopy) you may notice spotting of blood in your stool or on the toilet paper. If you underwent a bowel prep for your procedure, you may not have a normal bowel movement for a few days.  Please Note:  You might notice some irritation and congestion in your nose or some drainage.  This is from the oxygen  used during your procedure.  There is no need for concern and it should clear up in a day or so.  SYMPTOMS TO REPORT IMMEDIATELY:  Following lower endoscopy (colonoscopy or flexible sigmoidoscopy):  Excessive amounts of blood in the stool  Significant tenderness or worsening of abdominal pains  Swelling of the abdomen that is new, acute  Fever of 100F or higher  Following upper endoscopy (EGD)  Vomiting of blood or coffee ground material  New chest pain or pain under the shoulder blades  Painful or persistently difficult swallowing  New shortness of breath  Fever of 100F or  higher  Black, tarry-looking stools  For urgent or emergent issues, a gastroenterologist can be reached at any hour by calling [phone]. Do not use MyChart messaging for urgent concerns.    DIET:  We do recommend a small meal at first, but then you may proceed to your regular diet.  Drink plenty of fluids but you should avoid alcoholic beverages for 24 hours.  ACTIVITY:  You should plan to take it easy for the rest of today and you should NOT DRIVE or use heavy machinery until tomorrow (because of the sedation medicines used during the test).    FOLLOW UP: Our staff will call the number listed on your records the next business day following your procedure.  We will call around 7:15- 8:00 am to check on you and address any questions or concerns that you may have regarding the information given to you following your procedure. If we do not reach you, we will leave a message.     If any biopsies were taken you will be contacted by phone or by letter within the next 1-3 weeks.  Please call us  at [phone] if you have not heard about the biopsies in 3 weeks.    SIGNATURES/CONFIDENTIALITY: You and/or your care partner have signed paperwork which will be entered into your electronic medical record.  These signatures attest to the fact that that the information above on your After Visit Summary has been reviewed and is understood.  Full responsibility of the confidentiality of this discharge information lies  with you and/or your care-partner.

## 2024-05-30 NOTE — Op Note (Signed)
 [facility] Patient Name: Allison Galloway Procedure Date: 05/30/2024 1:30 PM MRN: [id number] Endoscopist: Lynnie Bring , MD, [id number] Age: 43 Referring MD:  Date of Birth: [birth year] Gender: Female Account #: [id number] Procedure:                Upper GI endoscopy Indications:              Iron deficiency anemia-resolved after hysterectomy.                            RUQ pain. Medicines:                Monitored Anesthesia Care Procedure:                Pre-Anesthesia Assessment:                           - Prior to the procedure, a History and Physical                            was performed, and patient medications and                            allergies were reviewed. The patient's tolerance of                            previous anesthesia was also reviewed. The risks                            and benefits of the procedure and the sedation                            options and risks were discussed with the patient.                            All questions were answered, and informed consent                            was obtained. Prior Anticoagulants: The patient has                            taken no anticoagulant or antiplatelet agents. ASA                            Grade Assessment: II - A patient with mild systemic                            disease. After reviewing the risks and benefits,                            the patient was deemed in satisfactory condition to                            undergo the procedure.  After obtaining informed consent, the endoscope was                            passed under direct vision. Throughout the                            procedure, the patient's blood pressure, pulse, and                            oxygen  saturations were monitored continuously. The                            Olympus Scope [id number] was introduced through the                            mouth, and advanced to the second part of  duodenum.                            The upper GI endoscopy was accomplished without                            difficulty. The patient tolerated the procedure                            well. Scope In: Scope Out: Findings:                 A single 4 mm mucosal papilloma was found in the                            upper third of the esophagus, 25 cm from the                            incisors. Removed with cold biopsy forceps.                           The Z-line was regular and was found 35 cm from the                            incisors.                           Localized mild inflammation characterized by                            erythema was found in the gastric antrum. Biopsies                            were taken with a cold forceps for histology.                           The examined duodenum was normal. Biopsies for                            histology  were taken with a cold forceps for                            evaluation of celiac disease. Complications:            No immediate complications. Estimated Blood Loss:     Estimated blood loss: none. Impression:               - Small proximal esophageal papilloma s/p resection.                           - Mild gastritis.                           - Otherwise normal EGD. Recommendation:           - Patient has a contact number available for                            emergencies. The signs and symptoms of potential                            delayed complications were discussed with the                            patient. Return to normal activities tomorrow.                            Written discharge instructions were provided to the                            patient.                           - Resume previous diet.                           - Continue present medications.                           - Await pathology results.                           - The findings and recommendations were discussed                             with the designated responsible adult. Lynnie Bring, MD 05/30/2024 2:08:29 PM This report has been signed electronically.

## 2024-05-30 NOTE — Progress Notes (Unsigned)
 1308 Robinul 0.1 mg IV given due large amount of secretions upon assessment.  MD made aware, vss

## 2024-05-30 NOTE — Op Note (Signed)
 [facility] Patient Name: Allison Galloway Procedure Date: 05/30/2024 1:29 PM MRN: [id number] Endoscopist: Lynnie Bring , MD, [id number] Age: 43 Referring MD:  Date of Birth: [birth year] Gender: Female Account #: [id number] Procedure:                Colonoscopy Indications:              Iron deficiency anemia-resolved after hysterectomy. Medicines:                Monitored Anesthesia Care Procedure:                Pre-Anesthesia Assessment:                           - Prior to the procedure, a History and Physical                            was performed, and patient medications and                            allergies were reviewed. The patient's tolerance of                            previous anesthesia was also reviewed. The risks                            and benefits of the procedure and the sedation                            options and risks were discussed with the patient.                            All questions were answered, and informed consent                            was obtained. Prior Anticoagulants: The patient has                            taken no anticoagulant or antiplatelet agents. ASA                            Grade Assessment: II - A patient with mild systemic                            disease. After reviewing the risks and benefits,                            the patient was deemed in satisfactory condition to                            undergo the procedure.                           After obtaining informed consent, the colonoscope  was passed under direct vision. Throughout the                            procedure, the patient's blood pressure, pulse, and                            oxygen  saturations were monitored continuously. The                            Olympus Scope [id number] was introduced through the                            anus and advanced to the 2 cm into the ileum. The                             colonoscopy was performed without difficulty. The                            patient tolerated the procedure well. The quality                            of the bowel preparation was adequate to identify                            polyps greater than 5 mm in size. Retained stool                            with solid vegetable material in several areas of                            the colon which would clog the suction channel of                            the scope several times. Despite aggressive                            suctioning and aspiration, 80 to 85% of the colonic                            mucosa is visualized satisfactorily. Of note that                            small and flat lesions could have been missed. The                            terminal ileum, ileocecal valve, appendiceal                            orifice, and rectum were photographed. Scope In: 1:49:31 PM Scope Out: 2:03:26 PM Scope Withdrawal Time: 0 hours 11 minutes 1 second  Total Procedure Duration: 0 hours 13 minutes 55 seconds  Findings:  An 8 mm polyp was found in the proximal descending                            colon. The polyp was sessile. The polyp was removed                            with a cold snare. Resection and retrieval were                            complete.                           Non-bleeding internal hemorrhoids were found during                            retroflexion. The hemorrhoids were small and Grade                            I (internal hemorrhoids that do not prolapse).                           Retroflexion in the right colon was performed.                           The terminal ileum appeared normal.                           The exam was otherwise without abnormality on                            direct and retroflexion views. Complications:            No immediate complications. Estimated Blood Loss:     Estimated blood loss: none. Impression:                - One 8 mm polyp in the proximal descending colon,                            removed with a cold snare. Resected and retrieved.                           - Non-bleeding internal hemorrhoids.                           - The examined portion of the ileum was normal.                           - The examination was otherwise normal on direct                            and retroflexion views. Recommendation:           - Patient has a contact number available for                            emergencies.  The signs and symptoms of potential                            delayed complications were discussed with the                            patient. Return to normal activities tomorrow.                            Written discharge instructions were provided to the                            patient.                           - Resume previous diet.                           - Continue present medications.                           - Await pathology results.                           - MiraLAX  17 g p.o. daily.                           - Repeat colonoscopy for surveillance based on                            pathology results. Likely at earlier interval, d/t                            qualty of prep.                           - The findings and recommendations were discussed                            with the patient's family. Lynnie Bring, MD 05/30/2024 2:13:10 PM This report has been signed electronically.

## 2024-05-30 NOTE — Progress Notes (Unsigned)
 Called to room to assist during endoscopic procedure.  Patient ID and intended procedure confirmed with present staff. Received instructions for my participation in the procedure from the performing physician.

## 2024-05-31 ENCOUNTER — Telehealth: Payer: Self-pay | Admitting: *Deleted

## 2024-05-31 DIAGNOSIS — F447 Conversion disorder with mixed symptom presentation: Secondary | ICD-10-CM | POA: Diagnosis not present

## 2024-05-31 NOTE — Telephone Encounter (Signed)
 Attempted post procedure follow up call.  No answer - LVM.

## 2024-06-03 LAB — GENECONNECT MOLECULAR SCREEN: Genetic Analysis Overall Interpretation: NEGATIVE

## 2024-06-05 LAB — SURGICAL PATHOLOGY

## 2024-06-06 ENCOUNTER — Ambulatory Visit: Payer: Self-pay | Admitting: Gastroenterology

## 2024-06-06 DIAGNOSIS — B9681 Helicobacter pylori [H. pylori] as the cause of diseases classified elsewhere: Secondary | ICD-10-CM

## 2024-06-06 MED ORDER — DOXYCYCLINE MONOHYDRATE 100 MG PO CAPS: 100.0000 mg | ORAL_CAPSULE | Freq: Two times a day (BID) | ORAL | 0 refills | Status: DC

## 2024-06-06 MED ORDER — METRONIDAZOLE 500 MG PO TABS: 500.0000 mg | ORAL_TABLET | Freq: Two times a day (BID) | ORAL | 0 refills | Status: DC

## 2024-06-06 MED ORDER — PANTOPRAZOLE SODIUM 40 MG PO TBEC: 40.0000 mg | DELAYED_RELEASE_TABLET | Freq: Two times a day (BID) | ORAL | 0 refills | Status: DC

## 2024-06-06 MED ORDER — BISMUTH SUBSALICYLATE 262 MG PO CAPS: 2.0000 | ORAL_CAPSULE | Freq: Four times a day (QID) | ORAL | 0 refills | Status: DC

## 2024-06-06 NOTE — Telephone Encounter (Signed)
 MyChart message and called 2 times and left voicemail

## 2024-06-06 NOTE — Progress Notes (Signed)
 Please inform the patient. Positive H. pylori gastritis, will give Quad therapy with medication listed below for 14 days   -bismuth  subsalicylate 262 mg 2 tabs 4 times daily (#112)  -metronidazole  500mg  BID (#28) -doxycyline 100mg  BID (#28) -pantoprazole  40 mg twice daily.   I recommend that the patient abstain from all alcohol  while taking metronidazole .   To avoid side effects of metronidazole , I recommend that she stick to simple meals and not eat rich or spicy food.  She should always try to take your metronidazole  after a meal or snack. Most common side effects include nausea, vomiting, diarrhea, stomach upset and rash.    Eight weeks after completing the treatment, an H pylori stool antigen/Diatherix should be performed to monitor for response to treatment.    Send report to family physician

## 2024-06-06 NOTE — Telephone Encounter (Signed)
-----   Message from Lynnie Bring sent at 06/06/2024  4:00 PM EDT ----- Please inform the patient. Positive H. pylori gastritis, will give Quad therapy with medication listed below for 14 days   -bismuth  subsalicylate 262 mg 2 tabs 4 times daily (#112)  -metronidazole  500mg  BID (#28) -doxycyline 100mg  BID (#28) -pantoprazole  40 mg twice daily.   I recommend that the patient abstain from all alcohol  while taking metronidazole .   To avoid side effects of metronidazole , I recommend that she stick to simple meals and not eat rich or spicy food.  She should always try to take your metronidazole  after a meal or snack. Most common side effects include nausea, vomiting, diarrhea, stomach upset and rash.    Eight weeks after completing the treatment, an H pylori stool antigen/Diatherix should be performed to monitor for response to treatment.    Send report to family physician ----- Message ----- From: Interface, Lab [facility] One Sent: 06/05/2024  11:31 AM EDT To: Lynnie Bring, MD

## 2024-06-06 NOTE — Addendum Note (Signed)
 Addended by: KATHIE BOTTCHER E on: 06/06/2024 05:04 PM   Modules accepted: Orders

## 2024-06-09 DIAGNOSIS — Z419 Encounter for procedure for purposes other than remedying health state, unspecified: Secondary | ICD-10-CM | POA: Diagnosis not present

## 2024-06-27 ENCOUNTER — Ambulatory Visit
Admission: RE | Admit: 2024-06-27 | Discharge: 2024-06-27 | Disposition: A | Discharge status: Home / Self Care | Source: Ambulatory Visit | Attending: Family Medicine | Admitting: Family Medicine

## 2024-07-04 ENCOUNTER — Other Ambulatory Visit (HOSPITAL_COMMUNITY): Payer: Self-pay | Admitting: Registered Nurse

## 2024-07-04 ENCOUNTER — Telehealth (HOSPITAL_COMMUNITY): Payer: Self-pay

## 2024-07-04 DIAGNOSIS — F444 Conversion disorder with motor symptom or deficit: Secondary | ICD-10-CM

## 2024-07-04 DIAGNOSIS — F411 Generalized anxiety disorder: Secondary | ICD-10-CM

## 2024-07-04 DIAGNOSIS — F332 Major depressive disorder, recurrent severe without psychotic features: Secondary | ICD-10-CM

## 2024-07-04 DIAGNOSIS — G4700 Insomnia, unspecified: Secondary | ICD-10-CM

## 2024-07-04 MED ORDER — HYDROXYZINE HCL 10 MG PO TABS: ORAL_TABLET | ORAL | 2 refills | Status: DC

## 2024-07-04 MED ORDER — FLUOXETINE HCL 20 MG PO CAPS: 20.0000 mg | ORAL_CAPSULE | Freq: Every day | ORAL | 2 refills | Status: DC

## 2024-07-04 MED ORDER — GUANFACINE HCL 1 MG PO TABS
1.0000 mg | ORAL_TABLET | Freq: Every day | ORAL | 2 refills | Status: DC
Start: 2024-07-04 — End: 2024-09-24

## 2024-07-04 MED ORDER — BUSPIRONE HCL 5 MG PO TABS: 5.0000 mg | ORAL_TABLET | Freq: Two times a day (BID) | ORAL | 2 refills | Status: DC

## 2024-07-04 NOTE — Telephone Encounter (Signed)
 Pt called in requesting medications Buspirone  5 MG, Fluoxetine  20 MG, Guanfacine  1 MG, and Hydroxyzine  10 MG be sent to [facility] due to her changing pharmacies. Please advise.

## 2024-07-05 NOTE — Telephone Encounter (Signed)
Spoke with pt advised rx has been sent to pharmacy she verbalized understanding

## 2024-07-06 ENCOUNTER — Ambulatory Visit: Admitting: Family Medicine

## 2024-07-06 ENCOUNTER — Encounter: Payer: Self-pay | Admitting: Family Medicine

## 2024-07-06 ENCOUNTER — Encounter: Payer: Self-pay | Admitting: Oncology

## 2024-07-06 VITALS — BP 97/65 | HR 68 | Temp 97.1°F | Ht 62.0 in | Wt 156.4 lb

## 2024-07-06 DIAGNOSIS — D75839 Thrombocytosis, unspecified: Secondary | ICD-10-CM | POA: Diagnosis not present

## 2024-07-06 DIAGNOSIS — D500 Iron deficiency anemia secondary to blood loss (chronic): Secondary | ICD-10-CM | POA: Diagnosis not present

## 2024-07-06 DIAGNOSIS — Z0184 Encounter for antibody response examination: Secondary | ICD-10-CM | POA: Diagnosis not present

## 2024-07-06 DIAGNOSIS — E538 Deficiency of other specified B group vitamins: Secondary | ICD-10-CM | POA: Diagnosis not present

## 2024-07-06 DIAGNOSIS — E119 Type 2 diabetes mellitus without complications: Secondary | ICD-10-CM | POA: Diagnosis not present

## 2024-07-06 DIAGNOSIS — F331 Major depressive disorder, recurrent, moderate: Secondary | ICD-10-CM | POA: Diagnosis not present

## 2024-07-06 DIAGNOSIS — K5909 Other constipation: Secondary | ICD-10-CM | POA: Diagnosis not present

## 2024-07-06 DIAGNOSIS — F449 Dissociative and conversion disorder, unspecified: Secondary | ICD-10-CM

## 2024-07-06 LAB — BAYER DCA HB A1C WAIVED: HB A1C (BAYER DCA - WAIVED): 6.1 % — ABNORMAL HIGH (ref 4.8–5.6)

## 2024-07-06 MED ORDER — POLYETHYLENE GLYCOL 3350 17 GM/SCOOP PO POWD: 17.0000 g | Freq: Every day | ORAL | 1 refills | Status: AC

## 2024-07-06 NOTE — Patient Instructions (Signed)

## 2024-07-06 NOTE — Progress Notes (Signed)
 Subjective:  Patient ID: Allison Galloway, female    DOB: 1980/12/12, 43 y.o.   MRN: [id number]  Patient Care Team: Severa Rock HERO, FNP as PCP - General (Family Medicine) Santo Stanly LABOR, MD as PCP - Cardiology (Cardiology) Ginette Shasta NOVAK, NP as Nurse Practitioner (Radiology) Ladora Ross Lacy Phebe, MD as Referring Physician (Optometry) Kara Dorn NOVAK, MD as Consulting Physician (Pulmonary Disease)   Chief Complaint:  Medical Management of Chronic Issues   HPI: Allison Galloway is a 44 y.o. female presenting on 07/06/2024 for Medical Management of Chronic Issues  KYNSLIE RINGLE is a 43 year old female with diabetes who presents for routine follow-up.  Diabetes management is stable with no increased hunger, thirst, or urination. She sometimes needs to remind herself to eat. She is currently taking Metformin  850 mg twice a day, which she finds effective. Her mother encourages her to eat more regularly.  She is taking Linzess  290 mcg for constipation. She reports that after her recent colonoscopy, she was told she was severely constipated. She has not yet started MiraLax  but plans to once she obtains the medication.  She has a history of conversion disorder and experienced a recent episode triggered by overstimulation at a [employer], resulting in increased tics. These symptoms were alleviated by taking Xanax provided by her sister-in-law, helping her return to baseline within 30 minutes.  She is not currently taking iron supplements due to intolerance. No shortness of breath, palpitations, significant weakness, or swelling in her legs or feet. She is taking Enalapril  2.5 mg. She is also taking B12 supplements but recently ran out and needs to replenish her supply.        Relevant past medical, surgical, family, and social history reviewed and updated as indicated.  Allergies and medications reviewed and updated. Data reviewed: Chart in Epic.   Past Medical History:  Diagnosis  Date   Abnormal vaginal bleeding 2024   Anxiety    Follows w/ PCP   Arthritis    left knee   Blood transfusion without reported diagnosis    Chronic low back pain with bilateral sciatica    Constipation    Follows with [facility] for chronic constipation.   Conversion disorder    speech difficulty, tremors, weakness, negative cardiac, pulmonary & neuro imaging workup. Patient states all symptoms were thought to be related to conversion disorder.   Delta-9-tetrahydrocannabinol (THC) dependence (HCC)    Depression    Follows w/ PCP.   Facial weakness 2023   left-sided facial weakiness, speech difficulty w/ stuttering, left-sided weakness / Saw neurologist,Dr. Anastasia Popp on 03/15/22 in Epic.   Headache    hx of migraines   SOB (shortness of breath)    Former smoker. Follows w/ Dr. Kara pulmonology and Dr. Lorrane, cardiology. 03/03/22 Echo within normal limits, EF 65 -70%. Patient states SOB r/t panic attacks.   Type 2 diabetes mellitus (HCC)    Follows w/ PCP, Rock Severa, FNP.   Wears glasses     Past Surgical History:  Procedure Laterality Date   ABDOMINAL HYSTERECTOMY     CESAREAN SECTION     3   CYSTOSCOPY N/A 07/05/2023   Procedure: CYSTOSCOPY;  Surgeon: Jeralyn Crutch, MD;  Location: [facility];  Service: Gynecology;  Laterality: N/A;   KNEE SURGERY Left    x 3   ROBOTIC ASSISTED TOTAL HYSTERECTOMY WITH BILATERAL SALPINGO OOPHERECTOMY Bilateral 07/05/2023   Procedure: XI ROBOTIC ASSISTED TOTAL LAPRASCOPIC HYSTERECTOMY WITH  BILATERAL SALPINGECTOMY,LYSIS OF ACHESIONS, TAP BLOCK;  Surgeon: Jeralyn Crutch, MD;  Location: [facility];  Service: Gynecology;  Laterality: Bilateral;   TUBAL LIGATION  2006    Social History   Socioeconomic History   Marital status: Married    Spouse name: Not on file   Number of children: 3   Years of education: GED   Highest education level: GED or equivalent  Occupational History   Not on file   Tobacco Use   Smoking status: Former    Current packs/day: 0.00    Types: Cigarettes    Quit date: 08/2021    Years since quitting: 2.8   Smokeless tobacco: Never  Vaping Use   Vaping status: Every Day   Substances: Nicotine, THC, Flavoring  Substance and Sexual Activity   Alcohol  use: No   Drug use: Yes    Types: Marijuana    Comment: delta 8 daily use   Sexual activity: Yes    Partners: Male    Birth control/protection: Surgical    Comment: tubal, menarche 43yo, sexual debut 43yo  Other Topics Concern   Not on file  Social History Narrative   Not on file   Social Drivers of Health   Financial Resource Strain: Low Risk  (04/04/2024)   Overall Financial Resource Strain (CARDIA)    Difficulty of Paying Living Expenses: Not very hard  Food Insecurity: Food Insecurity Present (04/04/2024)   Hunger Vital Sign    Worried About Running Out of Food in the Last Year: Sometimes true    Ran Out of Food in the Last Year: Never true  Transportation Needs: No Transportation Needs (04/04/2024)   PRAPARE - Administrator, Civil Service (Medical): No    Lack of Transportation (Non-Medical): No  Physical Activity: Unknown (04/04/2024)   Exercise Vital Sign    Days of Exercise per Week: Patient declined    Minutes of Exercise per Session: Not on file  Stress: Stress Concern Present (04/04/2024)   [employer] of Occupational Health - Occupational Stress Questionnaire    Feeling of Stress : Very much  Social Connections: Socially Isolated (04/04/2024)   Social Connection and Isolation Panel    Frequency of Communication with Friends and Family: Twice a week    Frequency of Social Gatherings with Friends and Family: Never    Attends Religious Services: Never    Database administrator or Organizations: No    Attends Engineer, structural: Not on file    Marital Status: Married  Catering manager Violence: Not At Risk (08/12/2023)   Humiliation, Afraid, Rape, and Kick  questionnaire    Fear of Current or Ex-Partner: No    Emotionally Abused: No    Physically Abused: No    Sexually Abused: No    Outpatient Encounter Medications as of 07/06/2024  Medication Sig   blood glucose meter kit and supplies Dispense based on patient and insurance preference. Use up to four times daily as directed. (FOR ICD-10 E10.9, E11.9).   busPIRone  (BUSPAR ) 5 MG tablet Take 1 tablet (5 mg total) by mouth 2 (two) times daily.   enalapril  (VASOTEC ) 2.5 MG tablet Take 1 tablet (2.5 mg total) by mouth daily.   FLUoxetine  (PROZAC ) 20 MG capsule Take 1 capsule (20 mg total) by mouth daily.   guanFACINE  (TENEX ) 1 MG tablet Take 1 tablet (1 mg total) by mouth at bedtime.   hydrOXYzine  (ATARAX ) 10 MG tablet Take 10-20 mg (1-2 tablets) three times a  day as needed for anxiety.  Take 10-30 mg (1-3 tablets) as needed for sleep   linaclotide  (LINZESS ) 290 MCG CAPS capsule Take 1 capsule (290 mcg total) by mouth daily before breakfast.   metFORMIN  (GLUCOPHAGE ) 850 MG tablet Take 1 tablet (850 mg total) by mouth 2 (two) times daily with a meal.   pantoprazole  (PROTONIX ) 40 MG tablet Take 1 tablet (40 mg total) by mouth 2 (two) times daily. For 14 days   polyethylene glycol powder (GLYCOLAX /MIRALAX ) 17 GM/SCOOP powder Take 17 g by mouth daily.   VENTOLIN  HFA 108 (90 Base) MCG/ACT inhaler Inhale 2 puffs into the lungs every 4 (four) hours as needed.   ferrous sulfate  325 (65 FE) MG EC tablet Take 1 tablet (325 mg total) by mouth every other day. (Patient not taking: Reported on 07/06/2024)   [DISCONTINUED] Bismuth  Subsalicylate 262 MG CAPS Take 2 capsules by mouth QID. For 14 days   [DISCONTINUED] doxycycline  (MONODOX ) 100 MG capsule Take 1 capsule (100 mg total) by mouth 2 (two) times daily. For 14 days   [DISCONTINUED] metroNIDAZOLE  (FLAGYL ) 500 MG tablet Take 1 tablet (500 mg total) by mouth 2 (two) times daily. For 14 days. Abstain from all alcohol  while taking metronidazole .   [DISCONTINUED]  norethindrone  (AYGESTIN ) 5 MG tablet Take 2 tablets (10 mg total) by mouth daily.   [DISCONTINUED] ofloxacin (FLOXIN) 0.3 % OTIC solution 5 drops 2 (two) times daily.   No facility-administered encounter medications on file as of 07/06/2024.    No Known Allergies  Pertinent ROS per HPI, otherwise unremarkable      Objective:  BP 97/65   Pulse 68   Temp (!) 97.1 F (36.2 C) (Temporal)   Ht 5' 2 (1.575 m)   Wt 156 lb 6.4 oz (70.9 kg)   LMP 10/02/2022 (Approximate) Comment: Patient has had irregular off and on bleeding in 2024.  SpO2 97%   BMI 28.61 kg/m    Wt Readings from Last 3 Encounters:  07/06/24 156 lb 6.4 oz (70.9 kg)  05/30/24 170 lb (77.1 kg)  04/04/24 168 lb 12.8 oz (76.6 kg)    Physical Exam Vitals and nursing note reviewed.  Constitutional:      General: She is not in acute distress.    Appearance: Normal appearance. She is overweight. She is not ill-appearing, toxic-appearing or diaphoretic.  HENT:     Head: Normocephalic and atraumatic.     Nose: Nose normal.     Mouth/Throat:     Mouth: Mucous membranes are moist.  Eyes:     Conjunctiva/sclera: Conjunctivae normal.     Pupils: Pupils are equal, round, and reactive to light.  Cardiovascular:     Rate and Rhythm: Normal rate and regular rhythm.     Heart sounds: Normal heart sounds.  Pulmonary:     Effort: Pulmonary effort is normal.     Breath sounds: Normal breath sounds.  Musculoskeletal:     Right lower leg: No edema.     Left lower leg: No edema.  Skin:    General: Skin is warm and dry.     Capillary Refill: Capillary refill takes less than 2 seconds.  Neurological:     General: No focal deficit present.     Mental Status: She is alert and oriented to person, place, and time.  Psychiatric:        Mood and Affect: Mood normal.        Behavior: Behavior normal. Behavior is cooperative.  Thought Content: Thought content normal.        Judgment: Judgment normal.      Results for  orders placed or performed in visit on 05/30/24  Surgical pathology ([facility])   Collection Time: 05/30/24 12:00 AM  Result Value Ref Range   SURGICAL PATHOLOGY      SURGICAL PATHOLOGY [facility] [address] [place], [place] [address] Telephone [phone] or [phone] Fax [phone]  REPORT OF SURGICAL PATHOLOGY   Accession #: [id number] Patient Name: MISKI, FELDPAUSCH Visit # : [id number]  MRN: [id number] Physician: Charlanne Groom DOB/Age 04/12/1981 (Age: 35) Gender: F Collected Date: 05/30/2024 Received Date: 06/04/2024  FINAL DIAGNOSIS       1. Surgical [P], small bowel :       -  DUODENAL MUCOSA WITH NO SIGNIFICANT PATHOLOGY.       2. Surgical [P], gastric antrum and gastric body :       -  ANTRAL AND OXYNTIC MUCOSA WITH MODERATE CHRONIC FOCALLY ACTIVE HELICOBACTER      ASSOCIATED GASTRITIS.      -  NUMEROUS HELICOBACTER PYLORI ORGANISMS ARE IDENTIFIED ON THE H&E STAINED      SLIDE.       3. Surgical [P], mid esophagus :       -  SQUAMOUS MUCOSA WITH MILD BASAL CELL HYPERPLASIA, NON-SPECIFIC.       4. Surgical [P], colon, descending, polyp (1) :       -  TUBULAR ADENOMA, FRAGMEN TS.       ELECTRONIC SIGNATURE : Legolvan Do, Mark, Pathologist, Electronic Signature  MICROSCOPIC DESCRIPTION  CASE COMMENTS STAINS USED IN DIAGNOSIS: H&E H&E-2 H&E H&E H&E H&E-2    CLINICAL HISTORY  SPECIMEN(S) OBTAINED 1. Surgical [P], Small Bowel 2. Surgical [P], Gastric Antrum And Gastric Body 3. Surgical [P], Mid Esophagus 4. Surgical [P], Colon, Descending, Polyp (1)  SPECIMEN COMMENTS: 1. Iron deficiency anemia, unspecified iron deficiency anemia type; chronic constipation; benign neoplasm of descending colon SPECIMEN CLINICAL INFORMATION: 1. R/O celiac sprue 2. R/O other gastritis 3. R/O other papilloma 4. R/O adenoma    Gross Description 1. Received in formalin are tan, soft tissue fragments that are submitted  in toto.Number: multiple, Size: 0.2 cm smallest to 0.4 cm largest, (1B) ( TA ) 2. Received in formalin are tan, soft tissue fragments that are submitted in toto.Number: 4, Size: 0.2 cm smallest to 0.4 cm largest, (1B) ( TA )  3. Received in formalin are tan, soft tissue fragments that are submitted in toto.Thin Number: 1 Size: 0.3 cm, (1B) ( TA ) 4. Received in formalin are tan, soft tissue fragments that are submitted in toto.Number: 3, Size: 0.5 cm smallest to 0.7 cm largest, (1B) ( TA )        Report signed out from the following location(s) [place] [address] [place], [place] [address] CLIA #: [id number]  [facility] [address] [place], [place] [address] CLIA #: [id number]        Pertinent labs & imaging results that were available during my care of the patient were reviewed by me and considered in my medical decision making.  Assessment & Plan:  Ysidra was seen today for medical management of chronic issues.  Diagnoses and all orders for this visit:  Type 2 diabetes mellitus without complication, without long-term current use of insulin (HCC) -     Anemia Profile B -  BMP8+EGFR -     Thyroid  Panel With TSH -     Bayer DCA Hb A1c Waived  Moderate episode of recurrent major depressive disorder (HCC) -     Thyroid  Panel With TSH  Chronic constipation -     polyethylene glycol powder (GLYCOLAX /MIRALAX ) 17 GM/SCOOP powder; Take 17 g by mouth daily.  Iron deficiency anemia due to chronic blood loss -     Anemia Profile B  Conversion disorder -     Anemia Profile B -     BMP8+EGFR -     Thyroid  Panel With TSH -     Bayer DCA Hb A1c Waived  Vitamin B12 deficiency -     Anemia Profile B  Thrombocytosis -     Anemia Profile B  Immunity status testing -     Hepatitis B surface antibody,quantitative       Chronic constipation Chronic constipation persists despite Linzess  290 mcg. Recent colonoscopy indicated  severe constipation. - Send prescription for MiraLax  to mail order pharmacy - Instruct to take a cap full of MiraLax  every morning with lots of water  - If symptoms persist, follow up with GI  Type 2 diabetes mellitus Type 2 diabetes mellitus is well-controlled with metformin  850 mg twice daily. Decreased appetite likely due to medication effects. - Continue metformin  850 mg twice daily - Encourage small frequent meals throughout the day, even if not hungry  Chronic kidney disease Chronic kidney disease is managed with enalapril  2.5 mg for renal protection. Blood pressure is low normal, so cautious with dose increase. - Order lab work to assess kidney function - Consider increasing enalapril  to 5 mg if kidney function is elevated  Anemia Anemia management is complicated by intolerance to iron supplements. No symptoms of shortness of breath, palpitations, or significant weakness reported. - Order lab work to check anemia profile and H&H levels  Vitamin B12 deficiency Vitamin B12 deficiency management is ongoing. Reports needing to obtain more B12 supplements. - Order lab work to check B12 levels  Conversion disorder Conversion disorder is generally well-managed, but recent episode triggered by overstimulation at [employer]. Self-medicated with Xanax, which was effective in calming symptoms. - Advise to discuss the use of Xanax with her psychiatrist at next appointment  Depression Depression is stable with current management. Reports limiting exposure to triggers.          Continue all other maintenance medications.  Follow up plan: Return in about 3 months (around 10/06/2024), or if symptoms worsen or fail to improve, for DM.   Continue healthy lifestyle choices, including diet (rich in fruits, vegetables, and lean proteins, and low in salt and simple carbohydrates) and exercise (at least 30 minutes of moderate physical activity daily).  Educational handout given for DM  The  above assessment and management plan was discussed with the patient. The patient verbalized understanding of and has agreed to the management plan. Patient is aware to call the clinic if they develop any new symptoms or if symptoms persist or worsen. Patient is aware when to return to the clinic for a follow-up visit. Patient educated on when it is appropriate to go to the emergency department.   Rosaline Bruns, FNP-C [facility] [phone]

## 2024-07-07 LAB — ANEMIA PROFILE B
Basophils Absolute: 0.1 x10E3/uL (ref 0.0–0.2)
Basos: 0 %
EOS (ABSOLUTE): 0.2 x10E3/uL (ref 0.0–0.4)
Eos: 2 %
Ferritin: 137 ng/mL (ref 15–150)
Folate: 5.2 ng/mL (ref >3.0)
Hematocrit: 48.2 % — ABNORMAL HIGH (ref 34.0–46.6)
Hemoglobin: 15.9 g/dL (ref 11.1–15.9)
Immature Grans (Abs): 0 x10E3/uL (ref 0.0–0.1)
Immature Granulocytes: 0 %
Iron Saturation: 21 % (ref 15–55)
Iron: 65 ug/dL (ref 27–159)
Lymphocytes Absolute: 3 x10E3/uL (ref 0.7–3.1)
Lymphs: 23 %
MCH: 30.1 pg (ref 26.6–33.0)
MCHC: 33 g/dL (ref 31.5–35.7)
MCV: 91 fL (ref 79–97)
Monocytes Absolute: 0.8 x10E3/uL (ref 0.1–0.9)
Monocytes: 6 %
Neutrophils Absolute: 9 x10E3/uL — ABNORMAL HIGH (ref 1.4–7.0)
Neutrophils: 69 %
Platelets: 284 x10E3/uL (ref 150–450)
RBC: 5.28 x10E6/uL (ref 3.77–5.28)
RDW: 13.1 % (ref 11.7–15.4)
Retic Ct Pct: 1.5 % (ref 0.6–2.6)
Total Iron Binding Capacity: 309 ug/dL (ref 250–450)
UIBC: 244 ug/dL (ref 131–425)
Vitamin B-12: 230 pg/mL — ABNORMAL LOW (ref 232–1245)
WBC: 13.1 x10E3/uL — ABNORMAL HIGH (ref 3.4–10.8)

## 2024-07-07 LAB — BMP8+EGFR
BUN/Creatinine Ratio: 13 (ref 9–23)
BUN: 14 mg/dL (ref 6–24)
CO2: 20 mmol/L (ref 20–29)
Calcium: 10.5 mg/dL — ABNORMAL HIGH (ref 8.7–10.2)
Chloride: 101 mmol/L (ref 96–106)
Creatinine, Ser: 1.08 mg/dL — ABNORMAL HIGH (ref 0.57–1.00)
Glucose: 67 mg/dL — ABNORMAL LOW (ref 70–99)
Potassium: 5.7 mmol/L — ABNORMAL HIGH (ref 3.5–5.2)
Sodium: 138 mmol/L (ref 134–144)
eGFR: 65 mL/min/1.73 (ref >59)

## 2024-07-07 LAB — HEPATITIS B SURFACE ANTIBODY, QUANTITATIVE: Hepatitis B Surf Ab Quant: 44.5 m[IU]/mL

## 2024-07-07 LAB — THYROID PANEL WITH TSH
Free Thyroxine Index: 2.3 (ref 1.2–4.9)
T3 Uptake Ratio: 23 % — ABNORMAL LOW (ref 24–39)
T4, Total: 9.8 ug/dL (ref 4.5–12.0)
TSH: 0.842 u[IU]/mL (ref 0.450–4.500)

## 2024-07-08 ENCOUNTER — Ambulatory Visit: Payer: Self-pay | Admitting: Family Medicine

## 2024-07-08 DIAGNOSIS — E119 Type 2 diabetes mellitus without complications: Secondary | ICD-10-CM

## 2024-07-08 DIAGNOSIS — E1122 Type 2 diabetes mellitus with diabetic chronic kidney disease: Secondary | ICD-10-CM

## 2024-07-08 DIAGNOSIS — E538 Deficiency of other specified B group vitamins: Secondary | ICD-10-CM

## 2024-07-08 MED ORDER — DAPAGLIFLOZIN PROPANEDIOL 10 MG PO TABS: 10.0000 mg | ORAL_TABLET | Freq: Every day | ORAL | 3 refills | Status: DC

## 2024-07-09 MED ORDER — CYANOCOBALAMIN 1000 MCG/ML IJ SOLN: 1000.0000 ug | INTRAMUSCULAR | Status: AC

## 2024-07-09 MED ORDER — CYANOCOBALAMIN 1000 MCG/ML IJ SOLN
1000.0000 ug | INTRAMUSCULAR | Status: AC
  Administered 2024-07-16 – 2024-07-23 (×2): 1000 ug via INTRAMUSCULAR

## 2024-07-09 NOTE — Telephone Encounter (Signed)
 Patient aware and verbalized understanding.  B12 orders entered and nurse visit scheduled.

## 2024-07-13 ENCOUNTER — Encounter: Payer: Self-pay | Admitting: Oncology

## 2024-07-13 ENCOUNTER — Telehealth: Payer: Self-pay

## 2024-07-13 NOTE — Telephone Encounter (Signed)
 Copied from CRM #[id number]. Topic: Clinical - Prescription Issue >> Jul 13, 2024  2:12 PM Edsel HERO wrote: Patient states that [facility] told her they do not show the rx for dapagliflozin  propanediol (FARXIGA ) 10 MG TABS tablet. Please resend to [facility] [place] - [address]  Phone: [phone] Fax: [phone]  Patient also states that the polyethylene glycol powder (GLYCOLAX /MIRALAX ) 17 GM/SCOOP powder requires prior authorization. Please advise.

## 2024-07-16 ENCOUNTER — Other Ambulatory Visit (HOSPITAL_COMMUNITY): Payer: Self-pay

## 2024-07-16 ENCOUNTER — Ambulatory Visit (INDEPENDENT_AMBULATORY_CARE_PROVIDER_SITE_OTHER)

## 2024-07-16 ENCOUNTER — Telehealth: Payer: Self-pay

## 2024-07-16 ENCOUNTER — Encounter: Payer: Self-pay | Admitting: Oncology

## 2024-07-16 DIAGNOSIS — E538 Deficiency of other specified B group vitamins: Secondary | ICD-10-CM

## 2024-07-16 NOTE — Progress Notes (Signed)
 Patient is in office today for a nurse visit for B12 Injection. Patient Injection was given in the  Left deltoid. Patient tolerated injection well.

## 2024-07-16 NOTE — Telephone Encounter (Signed)
 Pharmacy Patient Advocate Encounter   Received notification from Pt Calls Messages that prior authorization for MIRALAX  is required/requested.   Insurance verification completed.   The patient is insured through [employer] .   Per test claim: PLAN/BENEFIT EXCLUSION. OTC's not covered under Part D law

## 2024-07-16 NOTE — Telephone Encounter (Signed)
 Per test claim: PLAN/BENEFIT EXCLUSION. OTC's not covered under Part D law

## 2024-07-17 ENCOUNTER — Ambulatory Visit (HOSPITAL_COMMUNITY): Admitting: Psychiatry

## 2024-07-23 ENCOUNTER — Ambulatory Visit (INDEPENDENT_AMBULATORY_CARE_PROVIDER_SITE_OTHER)

## 2024-07-23 DIAGNOSIS — E538 Deficiency of other specified B group vitamins: Secondary | ICD-10-CM | POA: Diagnosis not present

## 2024-07-23 NOTE — Progress Notes (Signed)
 Patient is in office today for a nurse visit for B12 Injection. Patient Injection was given in the  Right deltoid. Patient tolerated injection well.

## 2024-07-25 ENCOUNTER — Encounter: Payer: Self-pay | Admitting: Oncology

## 2024-07-26 ENCOUNTER — Encounter: Payer: Self-pay | Admitting: Oncology

## 2024-07-31 ENCOUNTER — Ambulatory Visit

## 2024-08-02 ENCOUNTER — Telehealth: Payer: Self-pay

## 2024-08-02 DIAGNOSIS — B9681 Helicobacter pylori [H. pylori] as the cause of diseases classified elsewhere: Secondary | ICD-10-CM

## 2024-08-02 NOTE — Telephone Encounter (Signed)
 Patient said she will come the week of the 15th to pick up her kit from the second floor. She will return it to the lab the week of the 21st and understands.

## 2024-08-02 NOTE — Telephone Encounter (Signed)
-----   Message from [facility] sent at 06/11/2024  9:11 AM EDT ----- Regarding: [employer] H pylori Patient needs to be checked for h pylori to see if treatment work. [employer] H pylori

## 2024-08-13 ENCOUNTER — Ambulatory Visit

## 2024-08-20 ENCOUNTER — Ambulatory Visit (HOSPITAL_COMMUNITY): Admitting: Registered Nurse

## 2024-08-23 ENCOUNTER — Ambulatory Visit: Payer: Self-pay

## 2024-08-23 DIAGNOSIS — M79661 Pain in right lower leg: Secondary | ICD-10-CM | POA: Diagnosis not present

## 2024-08-23 DIAGNOSIS — F17210 Nicotine dependence, cigarettes, uncomplicated: Secondary | ICD-10-CM | POA: Diagnosis not present

## 2024-08-23 DIAGNOSIS — R600 Localized edema: Secondary | ICD-10-CM | POA: Diagnosis not present

## 2024-08-23 DIAGNOSIS — X500XXA Overexertion from strenuous movement or load, initial encounter: Secondary | ICD-10-CM | POA: Diagnosis not present

## 2024-08-23 NOTE — Telephone Encounter (Signed)
 Referred to ER

## 2024-08-23 NOTE — Telephone Encounter (Signed)
 FYI Only or Action Required?: FYI only for provider.  Patient was last seen in primary care on 07/06/2024 by Allison Rock HERO, FNP.  Called Nurse Triage reporting calf pain.  Symptoms began today.  Interventions attempted: Nothing.  Symptoms are: rapidly worsening.  Triage Disposition: Go to ED Now (Notify PCP)  Patient/caregiver understands and will follow disposition?: Yes    Copied from CRM [id number]. Topic: Clinical - Red Word Triage >> Aug 23, 2024  7:48 AM Rosaria BRAVO wrote: Red Word that prompted transfer to Nurse Triage: Right calf sharp pain, severe pain. Can't walk.    ----------------------------------------------------------------------- From previous Reason for Contact - Scheduling: Patient/patient representative is calling to schedule an appointment. Refer to attachments for appointment information. Reason for Disposition  Unable to walk  Answer Assessment - Initial Assessment Questions 1. ONSET: When did the pain start?      This am 2. LOCATION: Where is the pain located?      Right calf sharp pain 3. PAIN: How bad is the pain?    (Scale 1-10; or mild, moderate, severe)     severe 4. WORK OR EXERCISE: Has there been any recent work or exercise that involved this part of the body?      no 5. CAUSE: What do you think is causing the leg pain?     unknown 6. OTHER SYMPTOMS: Do you have any other symptoms? (e.g., chest pain, back pain, breathing difficulty, swelling, rash, fever, numbness, weakness)     no 7. PREGNANCY: Is there any chance you are pregnant? When was your last menstrual period?     na  Protocols used: Leg Pain-A-AH

## 2024-08-28 ENCOUNTER — Encounter: Payer: Self-pay | Admitting: Oncology

## 2024-08-31 ENCOUNTER — Other Ambulatory Visit: Payer: Self-pay | Admitting: Family Medicine

## 2024-08-31 DIAGNOSIS — E119 Type 2 diabetes mellitus without complications: Secondary | ICD-10-CM

## 2024-09-03 ENCOUNTER — Telehealth (HOSPITAL_COMMUNITY): Admitting: Registered Nurse

## 2024-09-11 ENCOUNTER — Encounter: Payer: Self-pay | Admitting: Family Medicine

## 2024-09-11 ENCOUNTER — Ambulatory Visit: Admitting: Family Medicine

## 2024-09-11 ENCOUNTER — Ambulatory Visit: Payer: Self-pay

## 2024-09-11 VITALS — BP 154/94 | HR 81 | Temp 97.9°F | Ht 62.0 in | Wt 155.6 lb

## 2024-09-11 DIAGNOSIS — R112 Nausea with vomiting, unspecified: Secondary | ICD-10-CM

## 2024-09-11 DIAGNOSIS — R34 Anuria and oliguria: Secondary | ICD-10-CM | POA: Diagnosis not present

## 2024-09-11 DIAGNOSIS — S99922A Unspecified injury of left foot, initial encounter: Secondary | ICD-10-CM | POA: Diagnosis not present

## 2024-09-11 MED ORDER — ONDANSETRON HCL 4 MG PO TABS: 4.0000 mg | ORAL_TABLET | Freq: Three times a day (TID) | ORAL | 0 refills | Status: AC | PRN

## 2024-09-11 MED ORDER — ONDANSETRON 4 MG PO TBDP
4.0000 mg | ORAL_TABLET | Freq: Once | ORAL | Status: AC
  Administered 2024-09-11: 4 mg via ORAL

## 2024-09-11 NOTE — Telephone Encounter (Signed)
 Pt has appt

## 2024-09-11 NOTE — Telephone Encounter (Signed)
 FYI Only or Action Required?: Action required by provider: request for appointment.  Patient was last seen in primary care on 07/06/2024 by Allison Rock HERO, FNP.  Called Nurse Triage reporting Vomiting.  Symptoms began a week ago.  Interventions attempted: Rest, hydration, or home remedies.  Symptoms are: unchanged. Nausea x 2 weeks, then vomiting. No one else in family sick.  Triage Disposition: See Physician Within 24 Hours  Patient/caregiver understands and will follow disposition?: Yes     Copied from CRM #[id number]. Topic: Clinical - Red Word Triage >> Sep 11, 2024  8:06 AM Carlatta H wrote: [facility] that prompted transfer to Nurse Triage: Patient has not been able to keep any food down when eating//She always feels nauseous or vomiting//She is not eating because she doesn't want to throw up// Answer Assessment - Initial Assessment Questions 1. VOMITING SEVERITY: How many times have you vomited in the past 24 hours?      6 2. ONSET: When did the vomiting begin?      Nausea x 2 weeks 3. FLUIDS: What fluids or food have you vomited up today? Have you been able to keep any fluids down?     no 4. ABDOMEN PAIN: Are your having any abdomen pain? If Yes : How bad is it and what does it feel like? (e.g., crampy, dull, intermittent, constant)      no 5. DIARRHEA: Is there any diarrhea? If Yes, ask: How many times today?      no 6. CONTACTS: Is there anyone else in the family with the same symptoms?      no 7. CAUSE: What do you think is causing your vomiting?     unsure 8. HYDRATION STATUS: Any signs of dehydration? (e.g., dry mouth [not only dry lips], too weak to stand) When did you last urinate?     mild 9. OTHER SYMPTOMS: Do you have any other symptoms? (e.g., fever, headache, vertigo, vomiting blood or coffee grounds, recent head injury)     no 10. PREGNANCY: Is there any chance you are pregnant? When was your last menstrual period?        no  Protocols used: Vomiting-A-AH  Reason for Disposition  [1] MILD or MODERATE vomiting AND [2] present > 48 hours (2 days)  (Exception: Mild vomiting with associated diarrhea.)  Answer Assessment - Initial Assessment Questions 1. VOMITING SEVERITY: How many times have you vomited in the past 24 hours?      6 2. ONSET: When did the vomiting begin?      Nausea x 2 weeks 3. FLUIDS: What fluids or food have you vomited up today? Have you been able to keep any fluids down?     no 4. ABDOMEN PAIN: Are your having any abdomen pain? If Yes : How bad is it and what does it feel like? (e.g., crampy, dull, intermittent, constant)      no 5. DIARRHEA: Is there any diarrhea? If Yes, ask: How many times today?      no 6. CONTACTS: Is there anyone else in the family with the same symptoms?      no 7. CAUSE: What do you think is causing your vomiting?     unsure 8. HYDRATION STATUS: Any signs of dehydration? (e.g., dry mouth [not only dry lips], too weak to stand) When did you last urinate?     mild 9. OTHER SYMPTOMS: Do you have any other symptoms? (e.g., fever, headache, vertigo, vomiting blood or coffee grounds, recent head injury)  no 10. PREGNANCY: Is there any chance you are pregnant? When was your last menstrual period?       no  Protocols used: Vomiting-A-AH

## 2024-09-11 NOTE — Progress Notes (Signed)
 Subjective:  Patient ID: Allison Galloway, female    DOB: 09-17-1981, 43 y.o.   MRN: [id number]  Patient Care Team: Severa Rock HERO, FNP as PCP - General (Family Medicine) Santo Stanly LABOR, MD as PCP - Cardiology (Cardiology) Ginette Shasta NOVAK, NP as Nurse Practitioner (Radiology) Ladora Ross Lacy Phebe, MD as Referring Physician (Optometry) Kara Dorn NOVAK, MD as Consulting Physician (Pulmonary Disease)   Chief Complaint:  Vomiting (All day yesterday ), Nausea (X 2 weeks ), and Toe Pain (Left pinky toe after hitting on bed )   HPI: Allison Galloway is a 43 y.o. female presenting on 09/11/2024 for Vomiting (All day yesterday ), Nausea (X 2 weeks ), and Toe Pain (Left pinky toe after hitting on bed )   Allison Galloway is a 43 year old female who presents with nausea and vomiting.  She has been experiencing nausea for the past two weeks, with vomiting beginning yesterday. She vomited approximately six to seven times yesterday, primarily after attempting to eat or drink. She has been unable to keep food or liquids down since yesterday, and movement exacerbates her symptoms.  She has tried peppermint as a remedy but has not taken any other medications for nausea recently. She recalls possibly having taken Zofran  in the past for nausea.  Her last bowel movement was the day before yesterday, and she takes Linzess  daily for constipation. She has been unable to urinate much today, with only a small amount early in the morning.  She does not recall any recent dietary changes or new medications. She reports difficulty urinating today, with minimal output.      She also reports an injury to her left fifth toe. States she hit it on her bed. Now has swelling, pain, and bruising. Has not tried anything for the symptoms.     Relevant past medical, surgical, family, and social history reviewed and updated as indicated.  Allergies and medications reviewed and updated. Data reviewed: Chart in  Epic.   Past Medical History:  Diagnosis Date   Abnormal vaginal bleeding 2024   Anxiety    Follows w/ PCP   Arthritis    left knee   Blood transfusion without reported diagnosis    Chronic low back pain with bilateral sciatica    Constipation    Follows with [facility] for chronic constipation.   Conversion disorder    speech difficulty, tremors, weakness, negative cardiac, pulmonary & neuro imaging workup. Patient states all symptoms were thought to be related to conversion disorder.   Delta-9-tetrahydrocannabinol (THC) dependence (HCC)    Depression    Follows w/ PCP.   Facial weakness 2023   left-sided facial weakiness, speech difficulty w/ stuttering, left-sided weakness / Saw neurologist,Dr. Anastasia Popp on 03/15/22 in Epic.   Headache    hx of migraines   SOB (shortness of breath)    Former smoker. Follows w/ Dr. Kara pulmonology and Dr. Lorrane, cardiology. 03/03/22 Echo within normal limits, EF 65 -70%. Patient states SOB r/t panic attacks.   Type 2 diabetes mellitus (HCC)    Follows w/ PCP, Rock Severa, FNP.   Wears glasses     Past Surgical History:  Procedure Laterality Date   ABDOMINAL HYSTERECTOMY     CESAREAN SECTION     3   CYSTOSCOPY N/A 07/05/2023   Procedure: CYSTOSCOPY;  Surgeon: Jeralyn Crutch, MD;  Location: [facility];  Service: Gynecology;  Laterality: N/A;   KNEE SURGERY Left  x 3   ROBOTIC ASSISTED TOTAL HYSTERECTOMY WITH BILATERAL SALPINGO OOPHERECTOMY Bilateral 07/05/2023   Procedure: XI ROBOTIC ASSISTED TOTAL LAPRASCOPIC HYSTERECTOMY WITH BILATERAL SALPINGECTOMY,LYSIS OF ACHESIONS, TAP BLOCK;  Surgeon: Jeralyn Crutch, MD;  Location: [facility];  Service: Gynecology;  Laterality: Bilateral;   TUBAL LIGATION  2006    Social History   Socioeconomic History   Marital status: Married    Spouse name: Not on file   Number of children: 3   Years of education: GED   Highest education level: GED or  equivalent  Occupational History   Not on file  Tobacco Use   Smoking status: Former    Current packs/day: 0.00    Types: Cigarettes    Quit date: 08/2021    Years since quitting: 3.0   Smokeless tobacco: Never  Vaping Use   Vaping status: Every Day   Substances: Nicotine, THC, Flavoring  Substance and Sexual Activity   Alcohol  use: No   Drug use: Yes    Types: Marijuana    Comment: delta 8 daily use   Sexual activity: Yes    Partners: Male    Birth control/protection: Surgical    Comment: tubal, menarche 43yo, sexual debut 43yo  Other Topics Concern   Not on file  Social History Narrative   Not on file   Social Drivers of Health   Financial Resource Strain: Low Risk  (04/04/2024)   Overall Financial Resource Strain (CARDIA)    Difficulty of Paying Living Expenses: Not very hard  Food Insecurity: Food Insecurity Present (04/04/2024)   Hunger Vital Sign    Worried About Running Out of Food in the Last Year: Sometimes true    Ran Out of Food in the Last Year: Never true  Transportation Needs: No Transportation Needs (04/04/2024)   PRAPARE - Administrator, Civil Service (Medical): No    Lack of Transportation (Non-Medical): No  Physical Activity: Unknown (04/04/2024)   Exercise Vital Sign    Days of Exercise per Week: Patient declined    Minutes of Exercise per Session: Not on file  Stress: Stress Concern Present (04/04/2024)   [employer] of Occupational Health - Occupational Stress Questionnaire    Feeling of Stress : Very much  Social Connections: Socially Isolated (04/04/2024)   Social Connection and Isolation Panel    Frequency of Communication with Friends and Family: Twice a week    Frequency of Social Gatherings with Friends and Family: Never    Attends Religious Services: Never    Database administrator or Organizations: No    Attends Engineer, structural: Not on file    Marital Status: Married  Catering manager Violence: Not At Risk  (08/12/2023)   Humiliation, Afraid, Rape, and Kick questionnaire    Fear of Current or Ex-Partner: No    Emotionally Abused: No    Physically Abused: No    Sexually Abused: No    Outpatient Encounter Medications as of 09/11/2024  Medication Sig   blood glucose meter kit and supplies Dispense based on patient and insurance preference. Use up to four times daily as directed. (FOR ICD-10 E10.9, E11.9).   busPIRone  (BUSPAR ) 5 MG tablet Take 1 tablet (5 mg total) by mouth 2 (two) times daily.   dapagliflozin  propanediol (FARXIGA ) 10 MG TABS tablet Take 1 tablet (10 mg total) by mouth daily before breakfast.   enalapril  (VASOTEC ) 2.5 MG tablet TAKE ONE TABLET (2.5 MG TOTAL) BY MOUTH DAILY AT 9AM  ferrous sulfate  325 (65 FE) MG EC tablet Take 1 tablet (325 mg total) by mouth every other day.   FLUoxetine  (PROZAC ) 20 MG capsule Take 1 capsule (20 mg total) by mouth daily.   guanFACINE  (TENEX ) 1 MG tablet Take 1 tablet (1 mg total) by mouth at bedtime.   hydrOXYzine  (ATARAX ) 10 MG tablet Take 10-20 mg (1-2 tablets) three times a day as needed for anxiety.  Take 10-30 mg (1-3 tablets) as needed for sleep   linaclotide  (LINZESS ) 290 MCG CAPS capsule Take 1 capsule (290 mcg total) by mouth daily before breakfast.   metFORMIN  (GLUCOPHAGE ) 850 MG tablet TAKE ONE TABLET (850 MG TOTAL) BY MOUTH TWICE DAILY AT 9AM & 5PM WITH MEALS   ondansetron  (ZOFRAN ) 4 MG tablet Take 1 tablet (4 mg total) by mouth every 8 (eight) hours as needed for nausea or vomiting.   pantoprazole  (PROTONIX ) 40 MG tablet Take 1 tablet (40 mg total) by mouth 2 (two) times daily. For 14 days   polyethylene glycol powder (GLYCOLAX /MIRALAX ) 17 GM/SCOOP powder Take 17 g by mouth daily.   VENTOLIN  HFA 108 (90 Base) MCG/ACT inhaler Inhale 2 puffs into the lungs every 4 (four) hours as needed.   Facility-Administered Encounter Medications as of 09/11/2024  Medication   cyanocobalamin  (VITAMIN B12) injection 1,000 mcg   [COMPLETED]  ondansetron  (ZOFRAN -ODT) disintegrating tablet 4 mg    No Known Allergies  Pertinent ROS per HPI, otherwise unremarkable      Objective:  BP (!) 154/94   Pulse 81   Temp 97.9 F (36.6 C)   Ht 5' 2 (1.575 m)   Wt 155 lb 9.6 oz (70.6 kg)   LMP 10/02/2022 (Approximate) Comment: Patient has had irregular off and on bleeding in 2024.  SpO2 98%   BMI 28.46 kg/m    Wt Readings from Last 3 Encounters:  09/11/24 155 lb 9.6 oz (70.6 kg)  07/06/24 156 lb 6.4 oz (70.9 kg)  05/30/24 170 lb (77.1 kg)    Physical Exam Vitals and nursing note reviewed.  Constitutional:      General: She is not in acute distress.    Appearance: Normal appearance. She is well-developed, well-groomed and overweight. She is not ill-appearing, toxic-appearing or diaphoretic.     Comments: Appears uncomfortable  HENT:     Head: Normocephalic and atraumatic.     Jaw: There is normal jaw occlusion.     Right Ear: Hearing normal.     Left Ear: Hearing normal.     Nose: Nose normal.     Mouth/Throat:     Lips: Pink.     Mouth: Mucous membranes are moist.     Pharynx: Oropharynx is clear. Uvula midline.  Eyes:     General: Lids are normal.     Extraocular Movements: Extraocular movements intact.     Conjunctiva/sclera: Conjunctivae normal.     Pupils: Pupils are equal, round, and reactive to light.  Neck:     Thyroid : No thyroid  mass, thyromegaly or thyroid  tenderness.     Vascular: No carotid bruit or JVD.     Trachea: Trachea and phonation normal.  Cardiovascular:     Rate and Rhythm: Normal rate and regular rhythm.     Chest Wall: PMI is not displaced.     Pulses: Normal pulses.          Dorsalis pedis pulses are 2+ on the left side.       Posterior tibial pulses are 2+ on the left side.  Heart sounds: Normal heart sounds. No murmur heard.    No friction rub. No gallop.  Pulmonary:     Effort: Pulmonary effort is normal. No respiratory distress.     Breath sounds: Normal breath sounds. No  wheezing.  Abdominal:     General: Bowel sounds are normal. There is no distension or abdominal bruit.     Palpations: Abdomen is soft. There is no hepatomegaly, splenomegaly or mass.     Tenderness: There is no abdominal tenderness. There is no right CVA tenderness, left CVA tenderness, guarding or rebound.     Hernia: No hernia is present.  Musculoskeletal:        General: Normal range of motion.     Cervical back: Normal range of motion and neck supple.     Right lower leg: No edema.     Left lower leg: No edema.       Feet:  Lymphadenopathy:     Cervical: No cervical adenopathy.  Skin:    General: Skin is warm and dry.     Capillary Refill: Capillary refill takes less than 2 seconds.     Coloration: Skin is not cyanotic, jaundiced or pale.     Findings: No rash.  Neurological:     General: No focal deficit present.     Mental Status: She is alert and oriented to person, place, and time.     Sensory: Sensation is intact.     Motor: Motor function is intact.     Coordination: Coordination is intact.     Gait: Gait is intact.     Deep Tendon Reflexes: Reflexes are normal and symmetric.  Psychiatric:        Attention and Perception: Attention and perception normal.        Mood and Affect: Mood and affect normal.        Speech: Speech normal.        Behavior: Behavior normal. Behavior is cooperative.        Thought Content: Thought content normal.        Cognition and Memory: Cognition and memory normal.        Judgment: Judgment normal.       Results for orders placed or performed in visit on 07/06/24  Bayer DCA Hb A1c Waived   Collection Time: 07/06/24 12:26 PM  Result Value Ref Range   HB A1C (BAYER DCA - WAIVED) 6.1 (H) 4.8 - 5.6 %  Anemia Profile B   Collection Time: 07/06/24 12:27 PM  Result Value Ref Range   Total Iron Binding Capacity 309 250 - 450 ug/dL   UIBC 755 868 - 574 ug/dL   Iron 65 27 - 840 ug/dL   Iron Saturation 21 15 - 55 %   Ferritin 137 15 -  150 ng/mL   Vitamin B-12 230 (L) 232 - 1,245 pg/mL   Folate 5.2 >3.0 ng/mL   WBC 13.1 (H) 3.4 - 10.8 x10E3/uL   RBC 5.28 3.77 - 5.28 x10E6/uL   Hemoglobin 15.9 11.1 - 15.9 g/dL   Hematocrit 51.7 (H) 65.9 - 46.6 %   MCV 91 79 - 97 fL   MCH 30.1 26.6 - 33.0 pg   MCHC 33.0 31.5 - 35.7 g/dL   RDW 86.8 88.2 - 84.5 %   Platelets 284 150 - 450 x10E3/uL   Neutrophils 69 Not Estab. %   Lymphs 23 Not Estab. %   Monocytes 6 Not Estab. %   Eos 2 Not Estab. %  Basos 0 Not Estab. %   Neutrophils Absolute 9.0 (H) 1.4 - 7.0 x10E3/uL   Lymphocytes Absolute 3.0 0.7 - 3.1 x10E3/uL   Monocytes Absolute 0.8 0.1 - 0.9 x10E3/uL   EOS (ABSOLUTE) 0.2 0.0 - 0.4 x10E3/uL   Basophils Absolute 0.1 0.0 - 0.2 x10E3/uL   Immature Granulocytes 0 Not Estab. %   Immature Grans (Abs) 0.0 0.0 - 0.1 x10E3/uL   Retic Ct Pct 1.5 0.6 - 2.6 %  BMP8+EGFR   Collection Time: 07/06/24 12:27 PM  Result Value Ref Range   Glucose 67 (L) 70 - 99 mg/dL   BUN 14 6 - 24 mg/dL   Creatinine, Ser 8.91 (H) 0.57 - 1.00 mg/dL   eGFR 65 >40 fO/fpw/8.26   BUN/Creatinine Ratio 13 9 - 23   Sodium 138 134 - 144 mmol/L   Potassium 5.7 (H) 3.5 - 5.2 mmol/L   Chloride 101 96 - 106 mmol/L   CO2 20 20 - 29 mmol/L   Calcium 10.5 (H) 8.7 - 10.2 mg/dL  Thyroid  Panel With TSH   Collection Time: 07/06/24 12:27 PM  Result Value Ref Range   TSH 0.842 0.450 - 4.500 uIU/mL   T4, Total 9.8 4.5 - 12.0 ug/dL   T3 Uptake Ratio 23 (L) 24 - 39 %   Free Thyroxine Index 2.3 1.2 - 4.9  Hepatitis B surface antibody,quantitative   Collection Time: 07/06/24 12:27 PM  Result Value Ref Range   Hepatitis B Surf Ab Quant 44.5 Immunity>10 mIU/mL       Pertinent labs & imaging results that were available during my care of the patient were reviewed by me and considered in my medical decision making.  Assessment & Plan:  Ennifer was seen today for vomiting, nausea and toe pain.  Diagnoses and all orders for this visit:  Nausea and vomiting in adult -      CMP14+EGFR -     CBC with Differential/Platelet -     ondansetron  (ZOFRAN ) 4 MG tablet; Take 1 tablet (4 mg total) by mouth every 8 (eight) hours as needed for nausea or vomiting. -     ondansetron  (ZOFRAN -ODT) disintegrating tablet 4 mg  Decreased urination -     CMP14+EGFR -     CBC with Differential/Platelet  Injury of left toe, initial encounter -     DG Toe 5th Left; Future     Acute nausea and vomiting with oliguria Nausea for two weeks with onset of vomiting yesterday, occurring postprandially. Unable to retain food or liquids. Oliguria with minimal urine output. Possible viral etiology. Dehydration risk due to fluid retention issues. QT interval normal, permitting Zofran  administration. - Prescribe Zofran  to be taken 15-20 minutes before eating or drinking. Zofran  given in office today.  - Provide information on a bland diet (BRAT diet). - Order laboratory tests to assess for infection or other abnormalities. - Advise to maintain hydration and monitor urine output. - Instruct to seek hospital care for IV fluids if unable to urinate at least three times a day.  Possible toe fracture Toe injury from impact with bed. Pain and bruising suggest possible fracture. - Buddy tape the injured toe. - Schedule x-ray for tomorrow to confirm fracture.          Continue all other maintenance medications.  Follow up plan: Return if symptoms worsen or fail to improve.   Continue healthy lifestyle choices, including diet (rich in fruits, vegetables, and lean proteins, and low in salt and simple carbohydrates) and exercise (at  least 30 minutes of moderate physical activity daily).  Educational handout given for [place], N/V  The above assessment and management plan was discussed with the patient. The patient verbalized understanding of and has agreed to the management plan. Patient is aware to call the clinic if they develop any new symptoms or if symptoms persist or worsen. Patient  is aware when to return to the clinic for a follow-up visit. Patient educated on when it is appropriate to go to the emergency department.   Rosaline Bruns, FNP-C [facility] [phone]

## 2024-09-12 ENCOUNTER — Ambulatory Visit (INDEPENDENT_AMBULATORY_CARE_PROVIDER_SITE_OTHER)

## 2024-09-12 ENCOUNTER — Ambulatory Visit: Payer: Self-pay | Admitting: Family Medicine

## 2024-09-12 ENCOUNTER — Ambulatory Visit (HOSPITAL_COMMUNITY): Admitting: Psychiatry

## 2024-09-12 DIAGNOSIS — F411 Generalized anxiety disorder: Secondary | ICD-10-CM

## 2024-09-12 DIAGNOSIS — F332 Major depressive disorder, recurrent severe without psychotic features: Secondary | ICD-10-CM

## 2024-09-12 DIAGNOSIS — S99922A Unspecified injury of left foot, initial encounter: Secondary | ICD-10-CM

## 2024-09-12 DIAGNOSIS — S92535A Nondisplaced fracture of distal phalanx of left lesser toe(s), initial encounter for closed fracture: Secondary | ICD-10-CM | POA: Diagnosis not present

## 2024-09-12 DIAGNOSIS — S92515A Nondisplaced fracture of proximal phalanx of left lesser toe(s), initial encounter for closed fracture: Secondary | ICD-10-CM

## 2024-09-12 DIAGNOSIS — D75839 Thrombocytosis, unspecified: Secondary | ICD-10-CM

## 2024-09-12 DIAGNOSIS — E538 Deficiency of other specified B group vitamins: Secondary | ICD-10-CM

## 2024-09-12 DIAGNOSIS — D500 Iron deficiency anemia secondary to blood loss (chronic): Secondary | ICD-10-CM

## 2024-09-12 LAB — CBC WITH DIFFERENTIAL/PLATELET
Basophils Absolute: 0.1 x10E3/uL (ref 0.0–0.2)
Basos: 0 %
EOS (ABSOLUTE): 1.3 x10E3/uL — ABNORMAL HIGH (ref 0.0–0.4)
Eos: 9 %
Hematocrit: 49.2 % — ABNORMAL HIGH (ref 34.0–46.6)
Hemoglobin: 16.2 g/dL — ABNORMAL HIGH (ref 11.1–15.9)
Immature Grans (Abs): 0 x10E3/uL (ref 0.0–0.1)
Immature Granulocytes: 0 %
Lymphocytes Absolute: 4.4 x10E3/uL — ABNORMAL HIGH (ref 0.7–3.1)
Lymphs: 31 %
MCH: 30.2 pg (ref 26.6–33.0)
MCHC: 32.9 g/dL (ref 31.5–35.7)
MCV: 92 fL (ref 79–97)
Monocytes Absolute: 0.7 x10E3/uL (ref 0.1–0.9)
Monocytes: 5 %
Neutrophils Absolute: 7.6 x10E3/uL — ABNORMAL HIGH (ref 1.4–7.0)
Neutrophils: 55 %
Platelets: 292 x10E3/uL (ref 150–450)
RBC: 5.36 x10E6/uL — ABNORMAL HIGH (ref 3.77–5.28)
RDW: 13.3 % (ref 11.7–15.4)
WBC: 14.1 x10E3/uL — ABNORMAL HIGH (ref 3.4–10.8)

## 2024-09-12 LAB — CMP14+EGFR
ALT: 28 IU/L (ref 0–32)
AST: 28 IU/L (ref 0–40)
Albumin: 4.6 g/dL (ref 3.9–4.9)
Alkaline Phosphatase: 79 IU/L (ref 41–116)
BUN/Creatinine Ratio: 10 (ref 9–23)
BUN: 10 mg/dL (ref 6–24)
Bilirubin Total: 0.4 mg/dL (ref 0.0–1.2)
CO2: 17 mmol/L — AB (ref 20–29)
Calcium: 10.1 mg/dL (ref 8.7–10.2)
Chloride: 103 mmol/L (ref 96–106)
Creatinine, Ser: 1 mg/dL (ref 0.57–1.00)
Globulin, Total: 3 g/dL (ref 1.5–4.5)
Glucose: 95 mg/dL (ref 70–99)
Potassium: 5.1 mmol/L (ref 3.5–5.2)
Sodium: 139 mmol/L (ref 134–144)
Total Protein: 7.6 g/dL (ref 6.0–8.5)
eGFR: 72 mL/min/1.73 (ref >59)
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
## 2024-09-17 ENCOUNTER — Other Ambulatory Visit: Payer: Self-pay | Admitting: *Deleted

## 2024-09-17 DIAGNOSIS — D500 Iron deficiency anemia secondary to blood loss (chronic): Secondary | ICD-10-CM

## 2024-09-18 DIAGNOSIS — S92525A Nondisplaced fracture of medial phalanx of left lesser toe(s), initial encounter for closed fracture: Secondary | ICD-10-CM | POA: Diagnosis not present

## 2024-09-24 ENCOUNTER — Encounter (HOSPITAL_COMMUNITY): Payer: Self-pay | Admitting: Registered Nurse

## 2024-09-24 ENCOUNTER — Telehealth (HOSPITAL_COMMUNITY): Admitting: Registered Nurse

## 2024-09-24 DIAGNOSIS — F444 Conversion disorder with motor symptom or deficit: Secondary | ICD-10-CM | POA: Diagnosis not present

## 2024-09-24 DIAGNOSIS — F332 Major depressive disorder, recurrent severe without psychotic features: Secondary | ICD-10-CM

## 2024-09-24 DIAGNOSIS — F411 Generalized anxiety disorder: Secondary | ICD-10-CM | POA: Diagnosis not present

## 2024-09-24 DIAGNOSIS — G4700 Insomnia, unspecified: Secondary | ICD-10-CM | POA: Diagnosis not present

## 2024-09-24 MED ORDER — GUANFACINE HCL 2 MG PO TABS: 1.0000 mg | ORAL_TABLET | Freq: Every day | ORAL | 3 refills | Status: DC

## 2024-09-24 MED ORDER — HYDROXYZINE HCL 10 MG PO TABS
ORAL_TABLET | ORAL | 3 refills | Status: DC
Start: 2024-09-24 — End: 2024-09-27

## 2024-09-24 MED ORDER — BUSPIRONE HCL 7.5 MG PO TABS: 7.5000 mg | ORAL_TABLET | Freq: Two times a day (BID) | ORAL | 3 refills | Status: DC

## 2024-09-24 MED ORDER — FLUOXETINE HCL 10 MG PO CAPS: 10.0000 mg | ORAL_CAPSULE | Freq: Every day | ORAL | 3 refills | Status: DC

## 2024-09-24 MED ORDER — FLUOXETINE HCL 20 MG PO CAPS: 20.0000 mg | ORAL_CAPSULE | Freq: Every day | ORAL | 3 refills | Status: DC

## 2024-09-24 NOTE — Patient Instructions (Signed)

## 2024-09-24 NOTE — Progress Notes (Signed)
 [facility] MD/PA/NP OP Progress Note  09/24/2024 5:06 PM Allison Galloway  MRN:  [id number]  Virtual Visit via Video Note  I connected with Allison Galloway on 09/24/24 at  4:30 PM EDT by a video enabled telemedicine application and verified that I am speaking with the correct person using two identifiers.  Location: Patient: Home Provider: Home office   I discussed the limitations of evaluation and management by telemedicine and the availability of in person appointments. The patient expressed understanding and agreed to proceed.  I discussed the assessment and treatment plan with the patient. The patient was provided an opportunity to ask questions and all were answered. The patient agreed with the plan and demonstrated an understanding of the instructions.   The patient was advised to call back or seek an in-person evaluation if the symptoms worsen or if the condition fails to improve as anticipated.  I provided 30 minutes of non-face-to-face time during this encounter.   Luisa Ruder, NP    Luisa Ruder, NP   Chief Complaint:  Chief Complaint  Patient presents with   Follow-up    Medication management   HPI: Allison Galloway 43 y.o. female presents to office today for medication management follow up.  She is seen via virtual video visit by this provider, and chart reviewed on 09/24/24.  Her psychiatric history is significant for major depressive disorder, general anxiety, and conversion disorder with abnormal movement.  Her mental health is currently managed with Prozac  20 mg daily, Guaifenesin 1 mg nightly, Buspar  5 mg twice daily, and Vistaril  10-20 mg 3 times daily as needed.  She reports current medications are effectively managing her mental health without any adverse reactions.  She states she has been testing her limit with what she can do without having any tics or Tourette's.  She states she was able to go to the [place] and did pretty good.  She reports she has also started  counseling/therapy.  I have had some issues with counseling/therapy.  I have only had the initial visit +1 other but the questioning and talking about issues seeing the calls that tics to occur.  She reports she is eating and sleeping without any difficulty.  However she does report there has been a decrease in appetite since starting Farxiga  for diabetes.  She denies suicidal/self-harm/homicidal ideation, psychosis, paranoia, and abnormal movements.  Screenings completed during today's visit AIMS, Nutrition, and Pain.  PHQ-9, C-SSRS, GAD-7 done 09/12/2024 during counseling/therapy session.  See scores below.  Recommendations: Increase Prozac  30 mg daily, BuSpar  7.5 mg 2 times daily, Guaifenesin 2 mg daily at bedtime, and continue Vistaril  10 mg -20 mg 3 times daily as needed. She voices understanding with information being given to her today and is agreeable to recommendations.    Visit Diagnosis:    ICD-10-CM   1. Severe episode of recurrent major depressive disorder, without psychotic features (HCC)  F33.2 FLUoxetine  (PROZAC ) 20 MG capsule    FLUoxetine  (PROZAC ) 10 MG capsule    2. GAD (generalized anxiety disorder)  F41.1 FLUoxetine  (PROZAC ) 20 MG capsule    FLUoxetine  (PROZAC ) 10 MG capsule    hydrOXYzine  (ATARAX ) 10 MG tablet    busPIRone  (BUSPAR ) 7.5 MG tablet    3. Conversion disorder with abnormal movement  F44.4 FLUoxetine  (PROZAC ) 20 MG capsule    guanFACINE  (TENEX ) 2 MG tablet    4. Insomnia, unspecified type  G47.00 hydrOXYzine  (ATARAX ) 10 MG tablet       Past Psychiatric History: Depression,  anxiety conversion disorder   Past Medical History:  Past Medical History:  Diagnosis Date   Abnormal vaginal bleeding 2024   Anxiety    Follows w/ PCP   Arthritis    left knee   Blood transfusion without reported diagnosis    Chronic low back pain with bilateral sciatica    Constipation    Follows with [facility] for chronic constipation.   Conversion disorder    speech  difficulty, tremors, weakness, negative cardiac, pulmonary & neuro imaging workup. Patient states all symptoms were thought to be related to conversion disorder.   Delta-9-tetrahydrocannabinol (THC) dependence (HCC)    Depression    Follows w/ PCP.   Facial weakness 2023   left-sided facial weakiness, speech difficulty w/ stuttering, left-sided weakness / Saw neurologist,Dr. Anastasia Popp on 03/15/22 in Epic.   Headache    hx of migraines   SOB (shortness of breath)    Former smoker. Follows w/ Dr. Kara pulmonology and Dr. Lorrane, cardiology. 03/03/22 Echo within normal limits, EF 65 -70%. Patient states SOB r/t panic attacks.   Type 2 diabetes mellitus (HCC)    Follows w/ PCP, Rock Bruns, FNP.   Wears glasses     Past Surgical History:  Procedure Laterality Date   ABDOMINAL HYSTERECTOMY     CESAREAN SECTION     3   CYSTOSCOPY N/A 07/05/2023   Procedure: CYSTOSCOPY;  Surgeon: Jeralyn Crutch, MD;  Location: [facility];  Service: Gynecology;  Laterality: N/A;   KNEE SURGERY Left    x 3   ROBOTIC ASSISTED TOTAL HYSTERECTOMY WITH BILATERAL SALPINGO OOPHERECTOMY Bilateral 07/05/2023   Procedure: XI ROBOTIC ASSISTED TOTAL LAPRASCOPIC HYSTERECTOMY WITH BILATERAL SALPINGECTOMY,LYSIS OF ACHESIONS, TAP BLOCK;  Surgeon: Jeralyn Crutch, MD;  Location: [facility];  Service: Gynecology;  Laterality: Bilateral;   TUBAL LIGATION  2006    Family Psychiatric History: See below and family history  Family History:  Family History  Problem Relation Age of Onset   Bipolar disorder Mother    Anxiety disorder Mother    Diabetes Mother    Depression Mother    Arthritis Mother    Asthma Mother    Learning disabilities Mother    Mental illness Mother    Anxiety disorder Sister    Depression Sister    Asthma Sister    Mental illness Sister    Seizures Daughter    Hypertension Other    Bipolar disorder Other    Anxiety disorder Other    Depression  Other    Colon cancer Neg Hx    Esophageal cancer Neg Hx    Rectal cancer Neg Hx    Prostate cancer Neg Hx     Social History:  Social History   Socioeconomic History   Marital status: Married    Spouse name: Not on file   Number of children: 3   Years of education: GED   Highest education level: GED or equivalent  Occupational History   Not on file  Tobacco Use   Smoking status: Former    Current packs/day: 0.00    Types: Cigarettes    Quit date: 08/2021    Years since quitting: 3.0   Smokeless tobacco: Never  Vaping Use   Vaping status: Every Day   Substances: Nicotine, THC, Flavoring  Substance and Sexual Activity   Alcohol  use: No   Drug use: Yes    Types: Marijuana    Comment: delta 8 daily use   Sexual activity: Yes  Partners: Male    Birth control/protection: Surgical    Comment: tubal, menarche 43yo, sexual debut 43yo  Other Topics Concern   Not on file  Social History Narrative   Not on file   Social Drivers of Health   Financial Resource Strain: Low Risk  (04/04/2024)   Overall Financial Resource Strain (CARDIA)    Difficulty of Paying Living Expenses: Not very hard  Food Insecurity: Food Insecurity Present (04/04/2024)   Hunger Vital Sign    Worried About Running Out of Food in the Last Year: Sometimes true    Ran Out of Food in the Last Year: Never true  Transportation Needs: No Transportation Needs (04/04/2024)   PRAPARE - Administrator, Civil Service (Medical): No    Lack of Transportation (Non-Medical): No  Physical Activity: Unknown (04/04/2024)   Exercise Vital Sign    Days of Exercise per Week: Patient declined    Minutes of Exercise per Session: Not on file  Stress: Stress Concern Present (04/04/2024)   [employer] of Occupational Health - Occupational Stress Questionnaire    Feeling of Stress : Very much  Social Connections: Socially Isolated (04/04/2024)   Social Connection and Isolation Panel    Frequency of Communication  with Friends and Family: Twice a week    Frequency of Social Gatherings with Friends and Family: Never    Attends Religious Services: Never    Database Administrator or Organizations: No    Attends Engineer, Structural: Not on file    Marital Status: Married    Allergies: No Known Allergies  Metabolic Disorder Labs: Lab Results  Component Value Date   HGBA1C 6.1 (H) 07/06/2024   Lab Results  Component Value Date   PROLACTIN 11.0 06/22/2022   Lab Results  Component Value Date   CHOL 182 04/04/2024   TRIG 264 (H) 04/04/2024   HDL 35 (L) 04/04/2024   CHOLHDL 5.2 (H) 04/04/2024   LDLCALC 102 (H) 04/04/2024   LDLCALC 97 02/11/2023   Lab Results  Component Value Date   TSH 0.842 07/06/2024   TSH 0.906 04/04/2024   Current Medications: Current Outpatient Medications  Medication Sig Dispense Refill   FLUoxetine  (PROZAC ) 10 MG capsule Take 1 capsule (10 mg total) by mouth daily. Take with Prozac  20 mg to total 30 mg daily 30 capsule 3   blood glucose meter kit and supplies Dispense based on patient and insurance preference. Use up to four times daily as directed. (FOR ICD-10 E10.9, E11.9). 1 each 0   busPIRone  (BUSPAR ) 7.5 MG tablet Take 1 tablet (7.5 mg total) by mouth 2 (two) times daily. 60 tablet 3   dapagliflozin  propanediol (FARXIGA ) 10 MG TABS tablet Take 1 tablet (10 mg total) by mouth daily before breakfast. 30 tablet 3   enalapril  (VASOTEC ) 2.5 MG tablet TAKE ONE TABLET (2.5 MG TOTAL) BY MOUTH DAILY AT 9AM 90 tablet 0   ferrous sulfate  325 (65 FE) MG EC tablet Take 1 tablet (325 mg total) by mouth every other day. (Patient not taking: Reported on 09/12/2024) 45 tablet 3   FLUoxetine  (PROZAC ) 20 MG capsule Take 1 capsule (20 mg total) by mouth daily. Take with Prozac  10 mg to total 30 mg daily 30 capsule 3   guanFACINE  (TENEX ) 2 MG tablet Take 0.5 tablets (1 mg total) by mouth at bedtime. 30 tablet 3   hydrOXYzine  (ATARAX ) 10 MG tablet Take 10-20 mg (1-2 tablets)  three times a day as needed for anxiety.  Take 10-30 mg (1-3 tablets) as needed for sleep 90 tablet 3   linaclotide  (LINZESS ) 290 MCG CAPS capsule Take 1 capsule (290 mcg total) by mouth daily before breakfast. 90 capsule 1   metFORMIN  (GLUCOPHAGE ) 850 MG tablet TAKE ONE TABLET (850 MG TOTAL) BY MOUTH TWICE DAILY AT 9AM & 5PM WITH MEALS (Patient not taking: Reported on 09/12/2024) 180 tablet 0   ondansetron  (ZOFRAN ) 4 MG tablet Take 1 tablet (4 mg total) by mouth every 8 (eight) hours as needed for nausea or vomiting. 20 tablet 0   pantoprazole  (PROTONIX ) 40 MG tablet Take 1 tablet (40 mg total) by mouth 2 (two) times daily. For 14 days (Patient not taking: Reported on 09/12/2024) 28 tablet 0   polyethylene glycol powder (GLYCOLAX /MIRALAX ) 17 GM/SCOOP powder Take 17 g by mouth daily. 3350 g 1   VENTOLIN  HFA 108 (90 Base) MCG/ACT inhaler Inhale 2 puffs into the lungs every 4 (four) hours as needed.     Current Facility-Administered Medications  Medication Dose Route Frequency Provider Last Rate Last Admin   cyanocobalamin  (VITAMIN B12) injection 1,000 mcg  1,000 mcg Intramuscular Q30 days Severa Rock HERO, FNP         Musculoskeletal: Strength & Muscle Tone: Unable to assess via virtual visit Gait & Station: Unable to assess via virtual visit Patient leans: N/A  Psychiatric Specialty Exam: Review of Systems  Constitutional:        No other complaints voiced at this time  Psychiatric/Behavioral:  Negative for hallucinations, self-injury and suicidal ideas. Agitation: Reports stable mood. Dysphoric mood: Stable but feels may be issues once therapy really starts. Sleep disturbance: Stable.Nervous/anxious: Stable but feels may be some issues once she gets into therapy.        Reports no involuntary movement or vocalization.  However she does state that there have been some tics occurring when she pushes her social limit and during questioning and counseling/therapy  All other systems reviewed and  are negative.   Last menstrual period 10/02/2022.There is no height or weight on file to calculate BMI.  General Appearance: Casual  Eye Contact:  Good  Speech:  Clear and Coherent and Normal Rate  Volume:  Normal  Mood:  Euthymic  Affect:  Appropriate and Congruent  Thought Process:  Coherent, Goal Directed, and Descriptions of Associations: Intact  Orientation:  Full (Time, Place, and Person)  Thought Content: Logical   Suicidal Thoughts:  No  Homicidal Thoughts:  No  Memory:  Immediate;   Good Recent;   Good Remote;   Good  Judgement:  Intact  Insight:  Present  Psychomotor Activity:  repetitive, involuntary movements and vocalizations  Concentration:  Concentration: Good and Attention Span: Good  Recall:  Good  Fund of Knowledge: Good  Language: Good  Akathisia:  No  Handed:  Right  AIMS (if indicated): not done  Assets:  Communication Skills Desire for Improvement Financial Resources/Insurance Housing Intimacy Leisure Time Resilience Social Support Transportation  ADL's:  Intact  Cognition: WNL  Sleep:  Good   Screenings: AIMS    Loss Adjuster, Chartered Office Visit from 04/02/2024 in [facility] [place]  AIMS Total Score 12   GAD-7    Flowsheet Row Counselor from 09/12/2024 in [facility] [place] Office Visit from 09/11/2024 in [facility] Office Visit from 07/06/2024 in [facility] Office Visit from 05/21/2024 in [facility] Visit from  04/04/2024 in [facility]  Total GAD-7 Score 14 8 4 8 18    PHQ2-9    Flowsheet Row Counselor from 09/12/2024 in [facility] [place] Office Visit from 09/11/2024 in [facility] Office Visit from 07/06/2024 in [facility]  Office Visit from 05/21/2024 in [facility] [place] Office Visit from 04/04/2024 in [facility]  PHQ-2 Total Score 4 1 1 1 5   PHQ-9 Total Score 17 8 7 5 17    Flowsheet Row Counselor from 09/12/2024 in [facility] [place] Office Visit from 05/21/2024 in [facility] [place] Office Visit from 04/02/2024 in [facility] [place]  C-SSRS RISK CATEGORY Moderate Risk No Risk No Risk     Assessment and Plan:  Assessment: Summary: Today HAYZEL RUBERG appears to be doing well.  She reports current medication regimen continues to effectively manage her mental health but has noticed some tics/Tourette's occurring after counseling/therapy sessions started.  Reports she has been pushing her limits on social interactions to see how far she can go before tics/Tourette's occur.  Reports she is eating and sleeping without any difficulty however there has been a decrease in appetite related to medication changes in her diabetic medication.  Reports overall she has been doing pretty good without any issues.  She denies suicidal/self-harm/homicidal ideation, psychosis, paranoia, abnormal movement.  Medication adjustments may During visit she is dressed appropriate for age and weather.  She is seated comfortably in view of camera with no noted distress.  She is alert/oriented x 4, calm/cooperative and mood is congruent with affect.  She spoke in a clear tone at moderate volume, and normal pace, with good eye contact.  Her thought process is coherent, relevant, and there is no indication that she is currently responding to internal/external stimuli or experiencing delusional thought content.    1. GAD (generalized anxiety disorder) - FLUoxetine  (PROZAC ) 20 MG capsule; Take 1 capsule (20 mg total) by mouth daily. Take with Prozac  10 mg to total 30 mg daily   Dispense: 30 capsule; Refill: 3 - FLUoxetine  (PROZAC ) 10 MG capsule; Take 1 capsule (10 mg total) by mouth daily. Take with Prozac  20 mg to total 30 mg daily  Dispense: 30 capsule; Refill: 3 - hydrOXYzine  (ATARAX ) 10 MG tablet; Take 10-20 mg (1-2 tablets) three times a day as needed for anxiety.  Take 10-30 mg (1-3 tablets) as needed for sleep  Dispense: 90 tablet; Refill: 3 - busPIRone  (BUSPAR ) 7.5 MG tablet; Take 1 tablet (7.5 mg total) by mouth 2 (two) times daily.  Dispense: 60 tablet; Refill: 3  2. Severe episode of recurrent major depressive disorder, without psychotic features (HCC) (Primary) - FLUoxetine  (PROZAC ) 20 MG capsule; Take 1 capsule (20 mg total) by mouth daily. Take with Prozac  10 mg to total 30 mg daily  Dispense: 30 capsule; Refill: 3 - FLUoxetine  (PROZAC ) 10 MG capsule; Take 1 capsule (10 mg total) by mouth daily. Take with Prozac  20 mg to total 30 mg daily  Dispense: 30 capsule; Refill: 3  3. Conversion disorder with abnormal movement - FLUoxetine  (PROZAC ) 20 MG capsule; Take 1 capsule (20 mg total) by mouth daily. Take with Prozac  10 mg to total 30 mg daily  Dispense: 30 capsule; Refill: 3 - guanFACINE  (TENEX ) 2 MG tablet; Take 0.5 tablets (1 mg total) by mouth at  bedtime.  Dispense: 30 tablet; Refill: 3  4. Insomnia, unspecified type - hydrOXYzine  (ATARAX ) 10 MG tablet; Take 10-20 mg (1-2 tablets) three times a day as needed for anxiety.  Take 10-30 mg (1-3 tablets) as needed for sleep  Dispense: 90 tablet; Refill: 3    Plan: Medications: Meds ordered this encounter  Medications   FLUoxetine  (PROZAC ) 20 MG capsule    Sig: Take 1 capsule (20 mg total) by mouth daily. Take with Prozac  10 mg to total 30 mg daily    Dispense:  30 capsule    Refill:  3    Supervising Provider:   ARFEEN, SYED T [[id number]]   FLUoxetine  (PROZAC ) 10 MG capsule    Sig: Take 1 capsule (10 mg total) by mouth daily. Take with Prozac  20 mg to total 30 mg daily    Dispense:  30 capsule    Refill:   3    Supervising Provider:   ARFEEN, SYED T [[id number]]   hydrOXYzine  (ATARAX ) 10 MG tablet    Sig: Take 10-20 mg (1-2 tablets) three times a day as needed for anxiety.  Take 10-30 mg (1-3 tablets) as needed for sleep    Dispense:  90 tablet    Refill:  3    Supervising Provider:   ARFEEN, SYED T [[id number]]   busPIRone  (BUSPAR ) 7.5 MG tablet    Sig: Take 1 tablet (7.5 mg total) by mouth 2 (two) times daily.    Dispense:  60 tablet    Refill:  3    Supervising Provider:   ARFEEN, SYED T [[id number]]   guanFACINE  (TENEX ) 2 MG tablet    Sig: Take 0.5 tablets (1 mg total) by mouth at bedtime.    Dispense:  30 tablet    Refill:  3    Supervising Provider:   CURRY PATERSON T [[id number]]    Labs:  Not indicated at this time  Other:  Counseling/therapy: Continue services with Winton Rubinstein, LCSW. MEAGON DUSKIN is instructed to call [phone], mobile crisis, or present to the nearest emergency room should she experience any suicidal/homicidal ideation, auditory/visual/hallucinations, or detrimental worsening of her mental health condition.   Allison Galloway participated in the development of this treatment plan and verbalized her agreement with plan as listed.  Follow Up: Return in 3 months for medication management follow up Call in the interim for any side-effects, decompensation, questions, or problems  Collaboration of Care: Collaboration of Care: Medication Management AEB medication assessment, adjustment, and refills  Patient/Guardian was advised Release of Information must be obtained prior to any record release in order to collaborate their care with an outside provider. Patient/Guardian was advised if they have not already done so to contact the registration department to sign all necessary forms in order for us  to release information regarding their care.   Consent: Patient/Guardian gives verbal consent for treatment and assignment of benefits for services provided during this visit. Patient/Guardian  expressed understanding and agreed to proceed.    Bern Fare, NP 09/24/2024, 5:06 PM

## 2024-09-25 ENCOUNTER — Inpatient Hospital Stay: Attending: Oncology

## 2024-09-25 DIAGNOSIS — D500 Iron deficiency anemia secondary to blood loss (chronic): Secondary | ICD-10-CM | POA: Insufficient documentation

## 2024-09-25 DIAGNOSIS — N920 Excessive and frequent menstruation with regular cycle: Secondary | ICD-10-CM | POA: Insufficient documentation

## 2024-09-25 LAB — IRON AND TIBC
Iron: 56 ug/dL (ref 28–170)
Saturation Ratios: 16 % (ref 10.4–31.8)
TIBC: 358 ug/dL (ref 250–450)
UIBC: 303 ug/dL

## 2024-09-27 ENCOUNTER — Telehealth (HOSPITAL_COMMUNITY): Payer: Self-pay | Admitting: *Deleted

## 2024-09-27 ENCOUNTER — Other Ambulatory Visit (HOSPITAL_COMMUNITY): Payer: Self-pay | Admitting: Registered Nurse

## 2024-09-27 DIAGNOSIS — G4700 Insomnia, unspecified: Secondary | ICD-10-CM

## 2024-09-27 DIAGNOSIS — F411 Generalized anxiety disorder: Secondary | ICD-10-CM

## 2024-09-27 MED ORDER — HYDROXYZINE HCL 10 MG PO TABS: ORAL_TABLET | ORAL | 3 refills | Status: DC

## 2024-09-27 NOTE — Telephone Encounter (Signed)
 Patient pharmacy is needing clarification for patient hydrOXYzine  (ATARAX ) 10 MG tablet. Which direction is patient suppose to be doing.   Pharmacy would like for provider to send in new script with clear direction and enough quantity for those new direction.

## 2024-09-28 NOTE — Telephone Encounter (Signed)
 Pharmacy is requesting clarifications on pt recent hydroxyzine  script.   Its stating too many directions.   They are needing a new script stating one direction with correct quantity (days supply).

## 2024-10-03 ENCOUNTER — Inpatient Hospital Stay: Attending: Oncology | Admitting: Oncology

## 2024-10-03 VITALS — BP 114/85 | HR 56 | Temp 97.9°F | Resp 18 | Wt 158.7 lb

## 2024-10-03 DIAGNOSIS — Z87891 Personal history of nicotine dependence: Secondary | ICD-10-CM | POA: Diagnosis not present

## 2024-10-03 DIAGNOSIS — Z79899 Other long term (current) drug therapy: Secondary | ICD-10-CM | POA: Diagnosis not present

## 2024-10-03 DIAGNOSIS — D500 Iron deficiency anemia secondary to blood loss (chronic): Secondary | ICD-10-CM | POA: Diagnosis present

## 2024-10-03 DIAGNOSIS — N920 Excessive and frequent menstruation with regular cycle: Secondary | ICD-10-CM | POA: Insufficient documentation

## 2024-10-03 DIAGNOSIS — D75839 Thrombocytosis, unspecified: Secondary | ICD-10-CM | POA: Diagnosis not present

## 2024-10-03 MED ORDER — MIRTAZAPINE 7.5 MG PO TABS: 7.5000 mg | ORAL_TABLET | Freq: Every day | ORAL | 0 refills | Status: DC

## 2024-10-03 NOTE — Assessment & Plan Note (Addendum)
 Improved hemoglobin from 9 to 14.4 and ferritin from 4.4 to 71 after 1g IV iron administration.  Labs from 09/25/2024 show iron saturation of 16% with normal TIBC.  Most recent hemoglobin is 16.2 from 09/11/2024.  No recent ferritin. -Tolerating oral iron fair.  Has some constipation. - We discussed given iron saturations are low, would recommend 1 additional dose of IV iron. -Recommend following up with GI.  We discussed could be due to low appetite and decreased absorption. She denies any bleeding, melena or hematochezia. Having some constipation secondary to iron tablets.

## 2024-10-03 NOTE — Progress Notes (Signed)
 [facility] at [facility] HEMATOLOGY FOLLOW-UP VISIT  [name], Rock HERO, FNP  REASON FOR FOLLOW-UP: Iron deficiency anemia  ASSESSMENT & PLAN:  Patient is a 43 year old female with iron deficiency anemia referred for IV iron replacement .   Assessment & Plan Iron deficiency anemia due to chronic blood loss Improved hemoglobin from 9 to 14.4 and ferritin from 4.4 to 71 after 1g IV iron administration.  Labs from 09/25/2024 show iron saturation of 16% with normal TIBC.  Most recent hemoglobin is 16.2 from 09/11/2024.  No recent ferritin. -Tolerating oral iron fair.  Has some constipation. - We discussed given iron saturations are low, would recommend 1 additional dose of IV iron. -Recommend following up with GI.  We discussed could be due to low appetite and decreased absorption. She denies any bleeding, melena or hematochezia. Having some constipation secondary to iron tablets.  Thrombocytosis Likely secondary to iron deficiency anemia -Resolved at this time History of tobacco abuse Patient continues to smoke, albeit less frequently. Discussed the increased risk of cancers and systemic inflammation. -Strongly advised patient to quit smoking.     Orders Placed This Encounter  Procedures   Iron and TIBC    Standing Status:   Future    Expected Date:   12/16/2024    Expiration Date:   03/16/2025   CBC with Differential/Platelet    Standing Status:   Future    Expected Date:   12/16/2024    Expiration Date:   03/16/2025   Ferritin    Standing Status:   Future    Expected Date:   12/16/2024    Expiration Date:   10/07/2025    The total time spent in the appointment was 15 minutes encounter with patients including review of chart and various tests results, discussions about plan of care and coordination of care plan   All questions were answered. The patient knows to call the clinic with any problems, questions or concerns. No barriers to learning was  detected.  Delon FORBES Hope, NP 11/9/202512:40 PM    INTERVAL HISTORY: DALLANA MAVITY 43 y.o. female with a history of iron deficiency anemia secondary to menorrhagia, presents for follow-up after receiving IV iron therapy.  Reports fatigue that is on and off.  2-3 nights per week she is unable to go out at night due to history of conversion disorder where she develops tics.  She is followed closely by psychiatry.  She has to has history of anxiety and depression.   She reports variable energy levels, with some days feeling ready to go and others with no energy whatsoever. She believes the recent hysterectomy has helped a lot.   Reports she is currently taking oral iron with constipation.  Denies any melena, hematochezia or bright red blood per rectum.  The patient also reports occasional dizziness, particularly when squatting and standing back up. She has been advised to hydrate more. She has a smoking history, currently smoking about a pack a day, which has increased from the last visit. She acknowledges the need to quit smoking but finds it challenging.  The patient also has a history of IBS and has an upcoming appointment with a GI specialist. She denies any abdominal pain, blood in stools, or black colored stools.  I have reviewed the past medical history, past surgical history, social history and family history with the patient   ALLERGIES:  has no known allergies.  MEDICATIONS:  Current Outpatient Medications  Medication Sig Dispense Refill  mirtazapine (REMERON) 7.5 MG tablet Take 1 tablet (7.5 mg total) by mouth at bedtime. 90 tablet 0   blood glucose meter kit and supplies Dispense based on patient and insurance preference. Use up to four times daily as directed. (FOR ICD-10 E10.9, E11.9). 1 each 0   busPIRone  (BUSPAR ) 7.5 MG tablet Take 1 tablet (7.5 mg total) by mouth 2 (two) times daily. 60 tablet 3   dapagliflozin  propanediol (FARXIGA ) 10 MG TABS tablet Take 1 tablet (10 mg  total) by mouth daily before breakfast. 30 tablet 3   enalapril  (VASOTEC ) 2.5 MG tablet TAKE ONE TABLET (2.5 MG TOTAL) BY MOUTH DAILY AT 9AM 90 tablet 0   ferrous sulfate  325 (65 FE) MG EC tablet Take 1 tablet (325 mg total) by mouth every other day. (Patient not taking: Reported on 09/12/2024) 45 tablet 3   FLUoxetine  (PROZAC ) 10 MG capsule Take 1 capsule (10 mg total) by mouth daily. Take with Prozac  20 mg to total 30 mg daily 30 capsule 3   FLUoxetine  (PROZAC ) 20 MG capsule Take 1 capsule (20 mg total) by mouth daily. Take with Prozac  10 mg to total 30 mg daily 30 capsule 3   guanFACINE  (TENEX ) 2 MG tablet Take 0.5 tablets (1 mg total) by mouth at bedtime. 30 tablet 3   hydrOXYzine  (ATARAX ) 10 MG tablet Take 10-20 mg (1-2 tablets) twice daily as needed for anxiety and 10 mg-30 mg (1-3 tablets) as needed at bedtime as needed for sleep. 90 tablet 3   linaclotide  (LINZESS ) 290 MCG CAPS capsule Take 1 capsule (290 mcg total) by mouth daily before breakfast. 90 capsule 1   metFORMIN  (GLUCOPHAGE ) 850 MG tablet TAKE ONE TABLET (850 MG TOTAL) BY MOUTH TWICE DAILY AT 9AM & 5PM WITH MEALS (Patient not taking: Reported on 09/12/2024) 180 tablet 0   ondansetron  (ZOFRAN ) 4 MG tablet Take 1 tablet (4 mg total) by mouth every 8 (eight) hours as needed for nausea or vomiting. 20 tablet 0   pantoprazole  (PROTONIX ) 40 MG tablet Take 1 tablet (40 mg total) by mouth 2 (two) times daily. For 14 days (Patient not taking: Reported on 09/12/2024) 28 tablet 0   polyethylene glycol powder (GLYCOLAX /MIRALAX ) 17 GM/SCOOP powder Take 17 g by mouth daily. 3350 g 1   VENTOLIN  HFA 108 (90 Base) MCG/ACT inhaler Inhale 2 puffs into the lungs every 4 (four) hours as needed.     Current Facility-Administered Medications  Medication Dose Route Frequency Provider Last Rate Last Admin   cyanocobalamin  (VITAMIN B12) injection 1,000 mcg  1,000 mcg Intramuscular Q30 days Severa Rock HERO, FNP         REVIEW OF SYSTEMS:   Review of  Systems  Gastrointestinal:  Positive for constipation and nausea.  Neurological:  Positive for tingling, sensory change and headaches.  Psychiatric/Behavioral:  The patient is nervous/anxious.      PHYSICAL EXAMINATION:   Vitals:   10/03/24 1257  BP: 114/85  Pulse: (!) 56  Resp: 18  Temp: 97.9 F (36.6 C)  SpO2: 99%    GENERAL:alert, no distress and comfortable SKIN: skin color, texture, turgor are normal, no rashes or significant lesions EYES: normal, Conjunctiva are pink and non-injected, sclera clear OROPHARYNX:no exudate, no erythema and lips, buccal mucosa, and tongue normal  NECK: supple, thyroid  normal size, non-tender, without nodularity LYMPH:  no palpable lymphadenopathy in the cervical, axillary or inguinal LUNGS: clear to auscultation and percussion with normal breathing effort HEART: regular rate & rhythm and no murmurs and no  lower extremity edema ABDOMEN:abdomen soft, non-tender and normal bowel sounds Musculoskeletal:no cyanosis of digits and no clubbing  NEURO: alert & oriented x 3 with fluent speech, no focal motor/sensory deficits  LABORATORY DATA:  I have reviewed the data as listed  Lab Results  Component Value Date   WBC 14.1 (H) 09/11/2024   NEUTROABS 7.6 (H) 09/11/2024   HGB 16.2 (H) 09/11/2024   HCT 49.2 (H) 09/11/2024   MCV 92 09/11/2024   PLT 292 09/11/2024      Component Value Date/Time   NA 139 09/11/2024 1109   K 5.1 09/11/2024 1109   CL 103 09/11/2024 1109   CO2 17 (L) 09/11/2024 1109   GLUCOSE 95 09/11/2024 1109   GLUCOSE 139 (H) 03/29/2024 1121   BUN 10 09/11/2024 1109   CREATININE 1.00 09/11/2024 1109   CALCIUM 10.1 09/11/2024 1109   PROT 7.6 09/11/2024 1109   ALBUMIN 4.6 09/11/2024 1109   AST 28 09/11/2024 1109   ALT 28 09/11/2024 1109   ALKPHOS 79 09/11/2024 1109   BILITOT 0.4 09/11/2024 1109   GFRNONAA >60 07/01/2023 0847   GFRAA >60 07/11/2020 1926       Chemistry      Component Value Date/Time   NA 139  09/11/2024 1109   K 5.1 09/11/2024 1109   CL 103 09/11/2024 1109   CO2 17 (L) 09/11/2024 1109   BUN 10 09/11/2024 1109   CREATININE 1.00 09/11/2024 1109      Component Value Date/Time   CALCIUM 10.1 09/11/2024 1109   ALKPHOS 79 09/11/2024 1109   AST 28 09/11/2024 1109   ALT 28 09/11/2024 1109   BILITOT 0.4 09/11/2024 1109     Lab Results  Component Value Date   IRON 56 09/25/2024   TIBC 358 09/25/2024   FERRITIN 137 07/06/2024

## 2024-10-03 NOTE — Assessment & Plan Note (Addendum)
Likely secondary to iron deficiency anemia -Resolved at this time

## 2024-10-03 NOTE — Assessment & Plan Note (Addendum)
Patient continues to smoke, albeit less frequently. Discussed the increased risk of cancers and systemic inflammation. -Strongly advised patient to quit smoking.

## 2024-10-07 ENCOUNTER — Encounter: Payer: Self-pay | Admitting: Oncology

## 2024-10-09 ENCOUNTER — Encounter: Payer: Self-pay | Admitting: Oncology

## 2024-10-09 ENCOUNTER — Encounter: Payer: Self-pay | Admitting: Family Medicine

## 2024-10-09 ENCOUNTER — Ambulatory Visit: Payer: Self-pay | Admitting: Family Medicine

## 2024-10-09 ENCOUNTER — Ambulatory Visit (INDEPENDENT_AMBULATORY_CARE_PROVIDER_SITE_OTHER): Payer: Self-pay | Admitting: Family Medicine

## 2024-10-09 VITALS — BP 108/65 | HR 70 | Temp 97.6°F | Ht 62.0 in | Wt 162.6 lb

## 2024-10-09 DIAGNOSIS — F449 Dissociative and conversion disorder, unspecified: Secondary | ICD-10-CM

## 2024-10-09 DIAGNOSIS — D329 Benign neoplasm of meninges, unspecified: Secondary | ICD-10-CM | POA: Diagnosis not present

## 2024-10-09 DIAGNOSIS — E538 Deficiency of other specified B group vitamins: Secondary | ICD-10-CM

## 2024-10-09 DIAGNOSIS — E119 Type 2 diabetes mellitus without complications: Secondary | ICD-10-CM | POA: Diagnosis not present

## 2024-10-09 DIAGNOSIS — Z7984 Long term (current) use of oral hypoglycemic drugs: Secondary | ICD-10-CM

## 2024-10-09 DIAGNOSIS — F331 Major depressive disorder, recurrent, moderate: Secondary | ICD-10-CM

## 2024-10-09 DIAGNOSIS — D500 Iron deficiency anemia secondary to blood loss (chronic): Secondary | ICD-10-CM | POA: Diagnosis not present

## 2024-10-09 LAB — BAYER DCA HB A1C WAIVED: HB A1C (BAYER DCA - WAIVED): 5.9 % — ABNORMAL HIGH (ref 4.8–5.6)

## 2024-10-09 NOTE — Progress Notes (Signed)
 Subjective:  Patient ID: Allison Galloway, female    DOB: 02/25/1981, 43 y.o.   MRN: [id number]  Patient Care Team: Severa Rock HERO, FNP as PCP - General (Family Medicine) Santo Stanly LABOR, MD as PCP - Cardiology (Cardiology) Ginette Shasta NOVAK, NP as Nurse Practitioner (Radiology) Ladora Ross Lacy Phebe, MD as Referring Physician (Optometry) Kara Dorn NOVAK, MD as Consulting Physician (Pulmonary Disease)   Chief Complaint:  Medical Management of Chronic Issues   HPI: Allison Galloway is a 43 y.o. female presenting on 10/09/2024 for Medical Management of Chronic Issues   Allison Galloway is a 43 year old female with diabetes who presents for a follow-up visit.  Glycemic control - Diabetes mellitus with recent lack of home blood glucose monitoring - Previously on metformin , discontinued due to decline in renal function - Started on Farxiga  in August; no issues with medication and covered by insurance - Hemoglobin A1c improved from 6.1 to 5.9 - No increased hunger, thirst, or urination  Vitamin and mineral deficiency - Vitamin B12 deficiency; unable to recall last laboratory assessment - Scheduled for iron infusion this Friday - Follow-up with oncologist planned after Christmas  Neurologic history - History of meningioma - Last neurology evaluation approximately 1-2 years ago - Last brain MRI in December of previous year - No new or worsening neurologic symptoms since last imaging  Behavioral health - Under care of behavioral health - Recent adjustment of psychiatric medications by psychiatrist - No issues with new psychiatric medications - Therapist appointment scheduled for next month          Relevant past medical, surgical, family, and social history reviewed and updated as indicated.  Allergies and medications reviewed and updated. Data reviewed: Chart in Epic.   Past Medical History:  Diagnosis Date   Abnormal vaginal bleeding 2024   Anxiety    Follows w/  PCP   Arthritis    left knee   Blood transfusion without reported diagnosis    Chronic low back pain with bilateral sciatica    Constipation    Follows with [facility] for chronic constipation.   Conversion disorder    speech difficulty, tremors, weakness, negative cardiac, pulmonary & neuro imaging workup. Patient states all symptoms were thought to be related to conversion disorder.   Delta-9-tetrahydrocannabinol (THC) dependence (HCC)    Depression    Follows w/ PCP.   Facial weakness 2023   left-sided facial weakiness, speech difficulty w/ stuttering, left-sided weakness / Saw neurologist,Dr. Anastasia Popp on 03/15/22 in Epic.   Headache    hx of migraines   SOB (shortness of breath)    Former smoker. Follows w/ Dr. Kara pulmonology and Dr. Lorrane, cardiology. 03/03/22 Echo within normal limits, EF 65 -70%. Patient states SOB r/t panic attacks.   Type 2 diabetes mellitus (HCC)    Follows w/ PCP, Rock Severa, FNP.   Wears glasses     Past Surgical History:  Procedure Laterality Date   ABDOMINAL HYSTERECTOMY     CESAREAN SECTION     3   CYSTOSCOPY N/A 07/05/2023   Procedure: CYSTOSCOPY;  Surgeon: Jeralyn Crutch, MD;  Location: [facility];  Service: Gynecology;  Laterality: N/A;   KNEE SURGERY Left    x 3   ROBOTIC ASSISTED TOTAL HYSTERECTOMY WITH BILATERAL SALPINGO OOPHERECTOMY Bilateral 07/05/2023   Procedure: XI ROBOTIC ASSISTED TOTAL LAPRASCOPIC HYSTERECTOMY WITH BILATERAL SALPINGECTOMY,LYSIS OF ACHESIONS, TAP BLOCK;  Surgeon: Jeralyn Crutch, MD;  Location: [facility];  Service: Gynecology;  Laterality: Bilateral;   TUBAL LIGATION  2006    Social History   Socioeconomic History   Marital status: Married    Spouse name: Not on file   Number of children: 3   Years of education: GED   Highest education level: GED or equivalent  Occupational History   Not on file  Tobacco Use   Smoking status: Former    Current packs/day:  0.00    Types: Cigarettes    Quit date: 08/2021    Years since quitting: 3.1   Smokeless tobacco: Never  Vaping Use   Vaping status: Every Day   Substances: Nicotine, THC, Flavoring  Substance and Sexual Activity   Alcohol  use: No   Drug use: Yes    Types: Marijuana    Comment: delta 8 daily use   Sexual activity: Yes    Partners: Male    Birth control/protection: Surgical    Comment: tubal, menarche 43yo, sexual debut 43yo  Other Topics Concern   Not on file  Social History Narrative   Not on file   Social Drivers of Health   Financial Resource Strain: Low Risk  (04/04/2024)   Overall Financial Resource Strain (CARDIA)    Difficulty of Paying Living Expenses: Not very hard  Food Insecurity: Food Insecurity Present (04/04/2024)   Hunger Vital Sign    Worried About Running Out of Food in the Last Year: Sometimes true    Ran Out of Food in the Last Year: Never true  Transportation Needs: No Transportation Needs (04/04/2024)   PRAPARE - Administrator, Civil Service (Medical): No    Lack of Transportation (Non-Medical): No  Physical Activity: Unknown (04/04/2024)   Exercise Vital Sign    Days of Exercise per Week: Patient declined    Minutes of Exercise per Session: Not on file  Stress: Stress Concern Present (04/04/2024)   [employer] of Occupational Health - Occupational Stress Questionnaire    Feeling of Stress : Very much  Social Connections: Socially Isolated (04/04/2024)   Social Connection and Isolation Panel    Frequency of Communication with Friends and Family: Twice a week    Frequency of Social Gatherings with Friends and Family: Never    Attends Religious Services: Never    Database Administrator or Organizations: No    Attends Engineer, Structural: Not on file    Marital Status: Married  Catering Manager Violence: Not At Risk (08/12/2023)   Humiliation, Afraid, Rape, and Kick questionnaire    Fear of Current or Ex-Partner: No    Emotionally  Abused: No    Physically Abused: No    Sexually Abused: No    Outpatient Encounter Medications as of 10/09/2024  Medication Sig   blood glucose meter kit and supplies Dispense based on patient and insurance preference. Use up to four times daily as directed. (FOR ICD-10 E10.9, E11.9).   busPIRone  (BUSPAR ) 7.5 MG tablet Take 1 tablet (7.5 mg total) by mouth 2 (two) times daily.   dapagliflozin  propanediol (FARXIGA ) 10 MG TABS tablet Take 1 tablet (10 mg total) by mouth daily before breakfast.   enalapril  (VASOTEC ) 2.5 MG tablet TAKE ONE TABLET (2.5 MG TOTAL) BY MOUTH DAILY AT 9AM   FLUoxetine  (PROZAC ) 10 MG capsule Take 1 capsule (10 mg total) by mouth daily. Take with Prozac  20 mg to total 30 mg daily   FLUoxetine  (PROZAC ) 20 MG capsule Take 1 capsule (20 mg total) by mouth daily. Take with  Prozac  10 mg to total 30 mg daily   guanFACINE  (TENEX ) 2 MG tablet Take 0.5 tablets (1 mg total) by mouth at bedtime.   hydrOXYzine  (ATARAX ) 10 MG tablet Take 10-20 mg (1-2 tablets) twice daily as needed for anxiety and 10 mg-30 mg (1-3 tablets) as needed at bedtime as needed for sleep.   linaclotide  (LINZESS ) 290 MCG CAPS capsule Take 1 capsule (290 mcg total) by mouth daily before breakfast.   mirtazapine (REMERON) 7.5 MG tablet Take 1 tablet (7.5 mg total) by mouth at bedtime.   ondansetron  (ZOFRAN ) 4 MG tablet Take 1 tablet (4 mg total) by mouth every 8 (eight) hours as needed for nausea or vomiting.   polyethylene glycol powder (GLYCOLAX /MIRALAX ) 17 GM/SCOOP powder Take 17 g by mouth daily.   VENTOLIN  HFA 108 (90 Base) MCG/ACT inhaler Inhale 2 puffs into the lungs every 4 (four) hours as needed.   [DISCONTINUED] ferrous sulfate  325 (65 FE) MG EC tablet Take 1 tablet (325 mg total) by mouth every other day. (Patient not taking: Reported on 10/09/2024)   [DISCONTINUED] metFORMIN  (GLUCOPHAGE ) 850 MG tablet TAKE ONE TABLET (850 MG TOTAL) BY MOUTH TWICE DAILY AT 9AM & 5PM WITH MEALS (Patient not taking:  Reported on 10/09/2024)   [DISCONTINUED] pantoprazole  (PROTONIX ) 40 MG tablet Take 1 tablet (40 mg total) by mouth 2 (two) times daily. For 14 days (Patient not taking: Reported on 10/09/2024)   Facility-Administered Encounter Medications as of 10/09/2024  Medication   cyanocobalamin  (VITAMIN B12) injection 1,000 mcg    No Known Allergies  Pertinent ROS per HPI, otherwise unremarkable      Objective:  BP 108/65   Pulse 70   Temp 97.6 F (36.4 C)   Ht 5' 2 (1.575 m)   Wt 162 lb 9.6 oz (73.8 kg)   LMP 10/02/2022 (Approximate) Comment: Patient has had irregular off and on bleeding in 2024.  SpO2 98%   BMI 29.74 kg/m    Wt Readings from Last 3 Encounters:  10/09/24 162 lb 9.6 oz (73.8 kg)  10/03/24 158 lb 11.7 oz (72 kg)  09/11/24 155 lb 9.6 oz (70.6 kg)    Physical Exam Vitals and nursing note reviewed.  Constitutional:      General: She is not in acute distress.    Appearance: Normal appearance. She is overweight. She is not ill-appearing, toxic-appearing or diaphoretic.  HENT:     Head: Normocephalic and atraumatic.     Nose: Nose normal.     Mouth/Throat:     Mouth: Mucous membranes are moist.  Eyes:     Pupils: Pupils are equal, round, and reactive to light.  Cardiovascular:     Rate and Rhythm: Normal rate and regular rhythm.     Heart sounds: Normal heart sounds.  Pulmonary:     Effort: Pulmonary effort is normal.     Breath sounds: Normal breath sounds.  Musculoskeletal:     Right lower leg: No edema.     Left lower leg: No edema.  Skin:    General: Skin is warm and dry.     Capillary Refill: Capillary refill takes less than 2 seconds.  Neurological:     General: No focal deficit present.     Mental Status: She is alert and oriented to person, place, and time.  Psychiatric:        Mood and Affect: Mood normal.        Behavior: Behavior normal.        Thought Content: Thought  content normal.        Judgment: Judgment normal.       Results for  orders placed or performed in visit on 09/25/24  Iron and TIBC   Collection Time: 09/25/24 10:33 AM  Result Value Ref Range   Iron 56 28 - 170 ug/dL   TIBC 641 749 - 549 ug/dL   Saturation Ratios 16 10.4 - 31.8 %   UIBC 303 ug/dL       Pertinent labs & imaging results that were available during my care of the patient were reviewed by me and considered in my medical decision making.  Assessment & Plan:  Allison Galloway was seen today for medical management of chronic issues.  Diagnoses and all orders for this visit:  Type 2 diabetes mellitus without complication, without long-term current use of insulin (HCC) -     Bayer DCA Hb A1c Waived -     Vitamin B12  Meningioma (HCC) -     Vitamin B12  Vitamin B12 deficiency -     Vitamin B12  Iron deficiency anemia due to chronic blood loss -     Vitamin B12  Moderate episode of recurrent major depressive disorder (HCC) -     Vitamin B12  Conversion disorder -     Vitamin B12      Type 2 diabetes mellitus Recent A1c of 5.9, improved from 6.1. Renal function has normalized since starting Farxiga  in August. Metformin  was discontinued due to renal function concerns. Farxiga  is well-tolerated and covered by insurance. - Continue Farxiga  for diabetes management. - Will monitor renal function at next visit.  Benign meningioma under annual surveillance Benign meningioma under annual surveillance. Last MRI was in December of last year. No new or worsening symptoms reported. Next follow-up with neurology is planned for next December. - Continue annual surveillance with neurology.  Iron deficiency anemia, chronic blood loss Chronic iron deficiency anemia with upcoming iron infusion scheduled for Friday. Follow-up with hematologist/oncologist is planned after Christmas. - Proceed with scheduled iron infusion. - Follow up with hematologist/oncologist after Christmas.  Deficiency of B group vitamins Deficiency of B group vitamins. Last check by  hematologist/oncologist is unclear.  Depression Managed with psychiatric care. No issues reported with current medications. Therapy appointment scheduled for next month. - Continue psychiatric care and medication management. - Attend scheduled therapy appointment next month.          Continue all other maintenance medications.  Follow up plan: Return in about 3 months (around 01/09/2025), or if symptoms worsen or fail to improve, for chronic follow up, all labs.   Continue healthy lifestyle choices, including diet (rich in fruits, vegetables, and lean proteins, and low in salt and simple carbohydrates) and exercise (at least 30 minutes of moderate physical activity daily).  Educational handout given for DM  The above assessment and management plan was discussed with the patient. The patient verbalized understanding of and has agreed to the management plan. Patient is aware to call the clinic if they develop any new symptoms or if symptoms persist or worsen. Patient is aware when to return to the clinic for a follow-up visit. Patient educated on when it is appropriate to go to the emergency department.   Rosaline Bruns, FNP-C [facility] [phone]

## 2024-10-09 NOTE — Patient Instructions (Addendum)

## 2024-10-10 ENCOUNTER — Inpatient Hospital Stay

## 2024-10-10 LAB — VITAMIN B12: Vitamin B-12: 238 pg/mL (ref 232–1245)

## 2024-10-12 ENCOUNTER — Inpatient Hospital Stay

## 2024-10-12 VITALS — BP 107/72 | HR 61 | Temp 97.0°F | Resp 19

## 2024-10-12 DIAGNOSIS — D500 Iron deficiency anemia secondary to blood loss (chronic): Secondary | ICD-10-CM

## 2024-10-12 MED ORDER — CETIRIZINE HCL 10 MG/ML IV SOLN
10.0000 mg | Freq: Once | INTRAVENOUS | Status: AC
  Administered 2024-10-12: 10 mg via INTRAVENOUS
  Filled 2024-10-12: qty 1

## 2024-10-12 MED ORDER — SODIUM CHLORIDE 0.9 % IV SOLN
50.0000 mg | Freq: Once | INTRAVENOUS | Status: AC
  Administered 2024-10-12: 50 mg via INTRAVENOUS
  Filled 2024-10-12: qty 1

## 2024-10-12 MED ORDER — ACETAMINOPHEN 325 MG PO TABS
650.0000 mg | ORAL_TABLET | Freq: Once | ORAL | Status: AC
  Administered 2024-10-12: 650 mg via ORAL
  Filled 2024-10-12: qty 2

## 2024-10-12 MED ORDER — METHYLPREDNISOLONE SODIUM SUCC 125 MG IJ SOLR
125.0000 mg | Freq: Once | INTRAMUSCULAR | Status: AC
  Administered 2024-10-12: 125 mg via INTRAVENOUS
  Filled 2024-10-12: qty 2

## 2024-10-12 MED ORDER — SODIUM CHLORIDE 0.9 % IV SOLN: INTRAVENOUS | Status: DC

## 2024-10-12 MED ORDER — FAMOTIDINE IN NACL 20-0.9 MG/50ML-% IV SOLN
20.0000 mg | Freq: Once | INTRAVENOUS | Status: AC
  Administered 2024-10-12: 20 mg via INTRAVENOUS
  Filled 2024-10-12: qty 50

## 2024-10-12 MED ORDER — SODIUM CHLORIDE 0.9 % IV SOLN
950.0000 mg | Freq: Once | INTRAVENOUS | Status: AC
  Administered 2024-10-12: 950 mg via INTRAVENOUS
  Filled 2024-10-12: qty 19

## 2024-10-12 NOTE — Progress Notes (Signed)
 Patient tolerated iron infusion with no complaints voiced.  Peripheral IV site clean and dry with good blood return noted before and after infusion. Pt observed for 30 minutes post iron without any complications.  VSS with discharge and left in satisfactory condition with no s/s of distress noted. All follow ups as scheduled.   Allison Galloway

## 2024-10-12 NOTE — Patient Instructions (Signed)
 Iron Dextran Injection What is this medication? IRON DEXTRAN (EYE ern DEX tran) treats low levels of iron in your body. Iron is a mineral that plays an important role in making red blood cells, which carry oxygen from your lungs to the rest of your body. This medicine may be used for other purposes; ask your health care provider or pharmacist if you have questions. COMMON BRAND NAME(S): Dexferrum, INFeD What should I tell my care team before I take this medication? They need to know if you have any of these conditions: Anemia not caused by low iron levels Heart disease High levels of iron in the blood Kidney disease Liver disease An unusual or allergic reaction to iron, other medications, foods, dyes, or preservatives Pregnant or trying to get pregnant Breastfeeding How should I use this medication? This medication is injected into a vein or a muscle. It is given by your care team in a hospital or clinic setting. Talk to your care team about the use of this medication in children. While it may be prescribed for children as young as 4 months for selected conditions, precautions do apply. Overdosage: If you think you have taken too much of this medicine contact a poison control center or emergency room at once. NOTE: This medicine is only for you. Do not share this medicine with others. What if I miss a dose? Keep appointments for follow-up doses. It is important not to miss your dose. Call your care team if you are unable to keep an appointment. What may interact with this medication? Do not take this medication with any of the following: Deferoxamine Dimercaprol Other iron products This medication may also interact with the following: Chloramphenicol Deferasirox This list may not describe all possible interactions. Give your health care provider a list of all the medicines, herbs, non-prescription drugs, or dietary supplements you use. Also tell them if you smoke, drink alcohol, or use  illegal drugs. Some items may interact with your medicine. What should I watch for while using this medication? Visit your care team for regular checks on your progress. Tell your care team if your symptoms do not start to get better or if they get worse. You may need blood work while taking this medication. You may need to eat more foods that contain iron. Talk to your care team. Foods that contain iron include whole grains/cereals, dried fruits, beans, peas, leafy green vegetables, and organ meats (liver, kidney). Long-term use of this medication may increase your risk of some cancers. Talk to your care team about your risk of cancer. What side effects may I notice from receiving this medication? Side effects that you should report to your care team as soon as possible: Allergic reactions--skin rash, itching, hives, swelling of the face, lips, tongue, or throat Low blood pressure--dizziness, feeling faint or lightheaded, blurry vision Shortness of breath Side effects that usually do not require medical attention (report to your care team if they continue or are bothersome): Flushing Headache Joint pain Muscle pain Nausea Pain, redness, or irritation at injection site This list may not describe all possible side effects. Call your doctor for medical advice about side effects. You may report side effects to FDA at 1-800-FDA-1088. Where should I keep my medication? This medication is given in a hospital or clinic. It will not be stored at home. NOTE: This sheet is a summary. It may not cover all possible information. If you have questions about this medicine, talk to your doctor, pharmacist, or health  care provider.  2024 Elsevier/Gold Standard (2023-07-06 00:00:00)

## 2024-11-06 ENCOUNTER — Other Ambulatory Visit: Payer: Self-pay | Admitting: Family Medicine

## 2024-11-06 DIAGNOSIS — E1122 Type 2 diabetes mellitus with diabetic chronic kidney disease: Secondary | ICD-10-CM

## 2024-11-06 DIAGNOSIS — E119 Type 2 diabetes mellitus without complications: Secondary | ICD-10-CM

## 2024-11-07 ENCOUNTER — Encounter: Payer: Self-pay | Admitting: Oncology

## 2024-11-21 ENCOUNTER — Ambulatory Visit (HOSPITAL_COMMUNITY): Admitting: Psychiatry

## 2024-11-21 DIAGNOSIS — F411 Generalized anxiety disorder: Secondary | ICD-10-CM

## 2024-11-21 DIAGNOSIS — F332 Major depressive disorder, recurrent severe without psychotic features: Secondary | ICD-10-CM | POA: Diagnosis not present
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
## 2024-11-26 ENCOUNTER — Encounter: Payer: Self-pay | Admitting: *Deleted

## 2024-12-05 ENCOUNTER — Ambulatory Visit (HOSPITAL_COMMUNITY): Payer: Medicare (Managed Care) | Admitting: Psychiatry

## 2024-12-05 ENCOUNTER — Other Ambulatory Visit: Payer: Self-pay | Admitting: Physician Assistant

## 2024-12-05 ENCOUNTER — Encounter (HOSPITAL_COMMUNITY): Payer: Self-pay

## 2024-12-05 ENCOUNTER — Encounter: Payer: Self-pay | Admitting: Oncology

## 2024-12-05 ENCOUNTER — Other Ambulatory Visit: Payer: Self-pay | Admitting: Family Medicine

## 2024-12-05 DIAGNOSIS — F411 Generalized anxiety disorder: Secondary | ICD-10-CM | POA: Diagnosis not present

## 2024-12-05 DIAGNOSIS — F332 Major depressive disorder, recurrent severe without psychotic features: Secondary | ICD-10-CM | POA: Diagnosis not present

## 2024-12-05 DIAGNOSIS — E119 Type 2 diabetes mellitus without complications: Secondary | ICD-10-CM
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
## 2024-12-06 ENCOUNTER — Encounter (HOSPITAL_COMMUNITY): Payer: Self-pay

## 2024-12-06 ENCOUNTER — Encounter (HOSPITAL_COMMUNITY): Payer: Self-pay | Admitting: Registered Nurse

## 2024-12-06 ENCOUNTER — Telehealth (INDEPENDENT_AMBULATORY_CARE_PROVIDER_SITE_OTHER): Payer: Medicare (Managed Care) | Admitting: Registered Nurse

## 2024-12-06 DIAGNOSIS — G4700 Insomnia, unspecified: Secondary | ICD-10-CM

## 2024-12-06 DIAGNOSIS — F444 Conversion disorder with motor symptom or deficit: Secondary | ICD-10-CM

## 2024-12-06 DIAGNOSIS — F332 Major depressive disorder, recurrent severe without psychotic features: Secondary | ICD-10-CM

## 2024-12-06 DIAGNOSIS — F411 Generalized anxiety disorder: Secondary | ICD-10-CM

## 2024-12-06 DIAGNOSIS — Z87891 Personal history of nicotine dependence: Secondary | ICD-10-CM

## 2024-12-06 DIAGNOSIS — D75839 Thrombocytosis, unspecified: Secondary | ICD-10-CM

## 2024-12-06 NOTE — Progress Notes (Signed)
 Patient connected around 0750 but disconnected prior to provider connecting at 0 800.  Will need to reschedule visit.

## 2024-12-06 NOTE — Patient Instructions (Signed)

## 2024-12-10 ENCOUNTER — Telehealth (INDEPENDENT_AMBULATORY_CARE_PROVIDER_SITE_OTHER): Payer: Medicare (Managed Care) | Admitting: Registered Nurse

## 2024-12-10 ENCOUNTER — Encounter (HOSPITAL_COMMUNITY): Payer: Self-pay | Admitting: Registered Nurse

## 2024-12-10 DIAGNOSIS — F411 Generalized anxiety disorder: Secondary | ICD-10-CM

## 2024-12-10 DIAGNOSIS — F444 Conversion disorder with motor symptom or deficit: Secondary | ICD-10-CM | POA: Diagnosis not present

## 2024-12-10 DIAGNOSIS — D75839 Thrombocytosis, unspecified: Secondary | ICD-10-CM | POA: Diagnosis not present

## 2024-12-10 DIAGNOSIS — G4700 Insomnia, unspecified: Secondary | ICD-10-CM

## 2024-12-10 DIAGNOSIS — F332 Major depressive disorder, recurrent severe without psychotic features: Secondary | ICD-10-CM

## 2024-12-10 DIAGNOSIS — Z87891 Personal history of nicotine dependence: Secondary | ICD-10-CM | POA: Diagnosis not present

## 2024-12-10 MED ORDER — MIRTAZAPINE 15 MG PO TABS: 15.0000 mg | ORAL_TABLET | Freq: Every day | ORAL | 1 refills | Status: AC

## 2024-12-10 MED ORDER — FLUOXETINE HCL 40 MG PO CAPS: 40.0000 mg | ORAL_CAPSULE | Freq: Every day | ORAL | 1 refills | Status: AC

## 2024-12-10 MED ORDER — HYDROXYZINE HCL 10 MG PO TABS: ORAL_TABLET | ORAL | 3 refills | Status: AC

## 2024-12-10 MED ORDER — GUANFACINE HCL 2 MG PO TABS: 4.0000 mg | ORAL_TABLET | Freq: Every day | ORAL | 1 refills | Status: AC

## 2024-12-10 MED ORDER — BUSPIRONE HCL 10 MG PO TABS: 10.0000 mg | ORAL_TABLET | Freq: Three times a day (TID) | ORAL | 1 refills | Status: AC

## 2024-12-10 NOTE — Patient Instructions (Signed)

## 2024-12-10 NOTE — Progress Notes (Signed)
 [facility] MD/PA/NP OP Progress Note  12/10/2024 7:29 PM SUSANNAH Galloway  MRN:  [id number]  Virtual Visit via Video Note  I connected with Allison Galloway on 12/10/2024 at  5:00 PM EST by a video enabled telemedicine application and verified that I am speaking with the correct person using two identifiers.  Location: Patient: Home Provider: Davene DEBI Galloway   I discussed the limitations of evaluation and management by telemedicine and the availability of in person appointments. The patient expressed understanding and agreed to proceed.  I discussed the assessment and treatment plan with the patient. The patient was provided an opportunity to ask questions and all were answered. The patient agreed with the plan and demonstrated an understanding of the instructions.   The patient was advised to call back or seek an in-person evaluation if the symptoms worsen or if the condition fails to improve as anticipated.  I provided 30 minutes of non-face-to-face time during this encounter.  I personally spent a total of 30 minutes in the care of the patient today including preparing to see the patient, getting/reviewing separately obtained history, performing a medically appropriate exam/evaluation, counseling and educating, placing orders, and documenting clinical information in the EHR in addiction to conducting screenings PHQ-9, C-SSRS, GAD-7, AIMS, Nutrition, and Pain, ordering labs, discussing medication, and discussing safety.   Allison Ruder, NP     Chief Complaint:  Chief Complaint  Patient presents with   Follow-up    Medication management   HPI: Allison Galloway 44 y.o. female presents to office today for medication management follow up.  She is seen via virtual video visit by this provider, and chart reviewed on 12/10/2024.  Her psychiatric history is significant for major depressive disorder, general anxiety, and conversion disorder with abnormal movement.  Her mental health is currently managed  with Prozac  30 mg daily, Guaifenesin 2 mg nightly, Buspar  7.5 mg twice daily, and Vistaril  10-20 mg 3 times daily as needed.  She reports current medications are not effectively managing her mental health.  She does deny adverse reactions to medications.  She reports worsening of mental health since last scheduled visit.  She states tics have restarted, worsening depression and anxiety.  She states that she is no longer going out and is staying home most of the time.  She states that the holidays were especially hard.  She states she is eating without difficulty but has started to have trouble falling to sleep again.  She denies suicidal/self-harm/homicidal ideation, psychosis, paranoia, and abnormal movement.  Screenings completed during today's visit PHQ-9, C-SSRS, GAD-7, AIMS, AUDIT, Nutrition, and Pain, see scores below.  Treatment options, and medication adjustments discussed.  Adjustments made with verbal agreement and understanding.  Recommendations: Increase Prozac  40 mg daily, BuSpar  10 mg three times daily, Guaifenesin 4 mg daily at bedtime, Remeron  15 mg daily at bedtime, and continue Vistaril  10 mg -20 mg 3 times daily as needed. She voices understanding with information being given to her today and is agreeable to recommendations.    Visit Diagnosis:    ICD-10-CM   1. Conversion disorder with abnormal movement  F44.4 guanFACINE  (TENEX ) 2 MG tablet    FLUoxetine  (PROZAC ) 40 MG capsule    2. GAD (generalized anxiety disorder)  F41.1 FLUoxetine  (PROZAC ) 40 MG capsule    busPIRone  (BUSPAR ) 10 MG tablet    hydrOXYzine  (ATARAX ) 10 MG tablet    3. Severe episode of recurrent major depressive disorder, without psychotic features (HCC)  F33.2 FLUoxetine  (PROZAC ) 40 MG  capsule    4. Insomnia, unspecified type  G47.00 hydrOXYzine  (ATARAX ) 10 MG tablet    5. Thrombocytosis  D75.839 mirtazapine  (REMERON ) 15 MG tablet    6. History of tobacco abuse  Z87.891 mirtazapine  (REMERON ) 15 MG tablet      Past Psychiatric History: Depression, anxiety conversion disorder   Past Medical History:  Past Medical History:  Diagnosis Date   Abnormal vaginal bleeding 2024   Anxiety    Follows w/ PCP   Arthritis    left knee   Blood transfusion without reported diagnosis    Chronic low back pain with bilateral sciatica    Constipation    Follows with [facility] for chronic constipation.   Conversion disorder    speech difficulty, tremors, weakness, negative cardiac, pulmonary & neuro imaging workup. Patient states all symptoms were thought to be related to conversion disorder.   Delta-9-tetrahydrocannabinol (THC) dependence (HCC)    Depression    Follows w/ PCP.   Facial weakness 2023   left-sided facial weakiness, speech difficulty w/ stuttering, left-sided weakness / Saw neurologist,Dr. Anastasia Popp on 03/15/22 in Epic.   Headache    hx of migraines   SOB (shortness of breath)    Former smoker. Follows w/ Dr. Kara pulmonology and Dr. Lorrane, cardiology. 03/03/22 Echo within normal limits, EF 65 -70%. Patient states SOB r/t panic attacks.   Type 2 diabetes mellitus (HCC)    Follows w/ PCP, Rock Bruns, FNP.   Wears glasses     Past Surgical History:  Procedure Laterality Date   ABDOMINAL HYSTERECTOMY     CESAREAN SECTION     3   CYSTOSCOPY N/A 07/05/2023   Procedure: CYSTOSCOPY;  Surgeon: Jeralyn Crutch, MD;  Location: [facility];  Service: Gynecology;  Laterality: N/A;   KNEE SURGERY Left    x 3   ROBOTIC ASSISTED TOTAL HYSTERECTOMY WITH BILATERAL SALPINGO OOPHERECTOMY Bilateral 07/05/2023   Procedure: XI ROBOTIC ASSISTED TOTAL LAPRASCOPIC HYSTERECTOMY WITH BILATERAL SALPINGECTOMY,LYSIS OF ACHESIONS, TAP BLOCK;  Surgeon: Jeralyn Crutch, MD;  Location: [facility];  Service: Gynecology;  Laterality: Bilateral;   TUBAL LIGATION  2006    Family Psychiatric History: See below and family history  Family History:  Family History   Problem Relation Age of Onset   Bipolar disorder Mother    Anxiety disorder Mother    Diabetes Mother    Depression Mother    Arthritis Mother    Asthma Mother    Learning disabilities Mother    Mental illness Mother    Anxiety disorder Sister    Depression Sister    Asthma Sister    Mental illness Sister    Seizures Daughter    Hypertension Other    Bipolar disorder Other    Anxiety disorder Other    Depression Other    Colon cancer Neg Hx    Esophageal cancer Neg Hx    Rectal cancer Neg Hx    Prostate cancer Neg Hx     Social History:  Social History   Socioeconomic History   Marital status: Married    Spouse name: Not on file   Number of children: 3   Years of education: GED   Highest education level: GED or equivalent  Occupational History   Not on file  Tobacco Use   Smoking status: Former    Current packs/day: 0.00    Average packs/day: 0.3 packs/day    Types: Cigarettes    Quit date: 08/2021  Years since quitting: 3.2   Smokeless tobacco: Never  Vaping Use   Vaping status: Every Day   Substances: Nicotine, THC, Flavoring  Substance and Sexual Activity   Alcohol  use: No   Drug use: Yes    Types: Marijuana    Comment: delta 8 daily use   Sexual activity: Yes    Partners: Male    Birth control/protection: Surgical    Comment: tubal, menarche 44yo, sexual debut 44yo  Other Topics Concern   Not on file  Social History Narrative   Not on file   Social Drivers of Health   Tobacco Use: Medium Risk (12/06/2024)   Patient History    Smoking Tobacco Use: Former    Smokeless Tobacco Use: Never    Passive Exposure: Not on file  Financial Resource Strain: Low Risk (04/04/2024)   Overall Financial Resource Strain (CARDIA)    Difficulty of Paying Living Expenses: Not very hard  Food Insecurity: Food Insecurity Present (04/04/2024)   Hunger Vital Sign    Worried About Running Out of Food in the Last Year: Sometimes true    Ran Out of Food in the Last Year:  Never true  Transportation Needs: No Transportation Needs (04/04/2024)   PRAPARE - Administrator, Civil Service (Medical): No    Lack of Transportation (Non-Medical): No  Physical Activity: Unknown (04/04/2024)   Exercise Vital Sign    Days of Exercise per Week: Patient declined    Minutes of Exercise per Session: Not on file  Stress: Stress Concern Present (04/04/2024)   [employer] of Occupational Health - Occupational Stress Questionnaire    Feeling of Stress : Very much  Social Connections: Socially Isolated (04/04/2024)   Social Connection and Isolation Panel    Frequency of Communication with Friends and Family: Twice a week    Frequency of Social Gatherings with Friends and Family: Never    Attends Religious Services: Never    Database Administrator or Organizations: No    Attends Engineer, Structural: Not on file    Marital Status: Married  Depression (PHQ2-9): High Risk (11/21/2024)   Depression (PHQ2-9)    PHQ-2 Score: 16  Alcohol  Screen: Low Risk (04/04/2024)   Alcohol  Screen    Last Alcohol  Screening Score (AUDIT): 0  Housing: Low Risk (04/04/2024)   Housing Stability Vital Sign    Unable to Pay for Housing in the Last Year: No    Number of Times Moved in the Last Year: 0    Homeless in the Last Year: No  Utilities: Not At Risk (08/12/2023)   AHC Utilities    Threatened with loss of utilities: No  Health Literacy: Not on file    Allergies: No Known Allergies  Metabolic Disorder Labs: Lab Results  Component Value Date   HGBA1C 5.9 (H) 10/09/2024   Lab Results  Component Value Date   PROLACTIN 11.0 06/22/2022   Lab Results  Component Value Date   CHOL 182 04/04/2024   TRIG 264 (H) 04/04/2024   HDL 35 (L) 04/04/2024   CHOLHDL 5.2 (H) 04/04/2024   LDLCALC 102 (H) 04/04/2024   LDLCALC 97 02/11/2023   Lab Results  Component Value Date   TSH 0.842 07/06/2024   TSH 0.906 04/04/2024   Current Medications: Current Outpatient Medications   Medication Sig Dispense Refill   FLUoxetine  (PROZAC ) 10 MG capsule Take 1 capsule (10 mg total) by mouth daily. Take with Prozac  20 mg to total 30 mg daily 30 capsule  3   blood glucose meter kit and supplies Dispense based on patient and insurance preference. Use up to four times daily as directed. (FOR ICD-10 E10.9, E11.9). 1 each 0   busPIRone  (BUSPAR ) 10 MG tablet Take 1 tablet (10 mg total) by mouth 3 (three) times daily. 270 tablet 1   dapagliflozin  propanediol (FARXIGA ) 10 MG TABS tablet TAKE ONE TABLET (10 MG TOTAL) BY MOUTH DAILY AT 9AM BEFORE BREAKFAST 30 tablet 1   enalapril  (VASOTEC ) 2.5 MG tablet TAKE ONE TABLET (2.5MG  TOTAL) BY MOUTH DAILY AT 9AM 90 tablet 0   FLUoxetine  (PROZAC ) 40 MG capsule Take 1 capsule (40 mg total) by mouth daily. Take with Prozac  10 mg to total 30 mg daily 90 capsule 1   guanFACINE  (TENEX ) 2 MG tablet Take 2 tablets (4 mg total) by mouth at bedtime. 180 tablet 1   hydrOXYzine  (ATARAX ) 10 MG tablet Take 10-20 mg (1-2 tablets) twice daily as needed for anxiety and 10 mg-30 mg (1-3 tablets) as needed at bedtime as needed for sleep. 180 tablet 3   LINZESS  290 MCG CAPS capsule TAKE ONE CAPSULE (290 MCG TOTAL) BY MOUTH DAILY BEFORE BREAKFAST 90 capsule 11   mirtazapine  (REMERON ) 15 MG tablet Take 1 tablet (15 mg total) by mouth at bedtime. 90 tablet 1   ondansetron  (ZOFRAN ) 4 MG tablet Take 1 tablet (4 mg total) by mouth every 8 (eight) hours as needed for nausea or vomiting. 20 tablet 0   polyethylene glycol powder (GLYCOLAX /MIRALAX ) 17 GM/SCOOP powder Take 17 g by mouth daily. 3350 g 1   VENTOLIN  HFA 108 (90 Base) MCG/ACT inhaler Inhale 2 puffs into the lungs every 4 (four) hours as needed.     Current Facility-Administered Medications  Medication Dose Route Frequency Provider Last Rate Last Admin   cyanocobalamin  (VITAMIN B12) injection 1,000 mcg  1,000 mcg Intramuscular Q30 days [place] HERO, FNP         Musculoskeletal: Strength & Muscle Tone: Unable to  assess via virtual visit Gait & Station: Unable to assess via virtual visit Patient leans: N/A  Psychiatric Specialty Exam: Review of Systems  Constitutional:        No other complaints voiced at this time  Psychiatric/Behavioral:  Positive for agitation, dysphoric mood and sleep disturbance. Negative for hallucinations, self-injury and suicidal ideas. The patient is nervous/anxious.        Reports no involuntary movement or vocalization.  However she does state that there have been some tics occurring when she pushes her social limit and during questioning and counseling/therapy  All other systems reviewed and are negative.   Last menstrual period 10/02/2022.There is no height or weight on file to calculate BMI.  General Appearance: Casual  Eye Contact:  Good  Speech:  Clear and Coherent and Normal Rate  Volume:  Normal  Mood:  Anxious and Depressed  Affect:  Congruent  Thought Process:  Coherent, Goal Directed, and Descriptions of Associations: Intact  Orientation:  Full (Time, Place, and Person)  Thought Content: Logical   Suicidal Thoughts:  No  Homicidal Thoughts:  No  Memory:  Immediate;   Good Recent;   Good Remote;   Good  Judgement:  Intact  Insight:  Present  Psychomotor Activity:  repetitive, involuntary movements and vocalizations  Concentration:  Concentration: Good and Attention Span: Good  Recall:  Good  Fund of Knowledge: Good  Language: Good  Akathisia:  No  Handed:  Right  AIMS (if indicated): not done  Assets:  Communication  Skills Desire for Improvement Financial Resources/Insurance Housing Intimacy Leisure Time Resilience Social Support Transportation  ADL's:  Intact  Cognition: WNL  Sleep:  Fair   Screenings: AIMS    Flowsheet Row Video Visit from 12/10/2024 in [facility] [place] Office Visit from 04/02/2024 in [facility] [place]  AIMS Total Score 4 12   GAD-7     Flowsheet Row Counselor from 11/21/2024 in [facility] [place] Office Visit from 10/09/2024 in [facility] Counselor from 09/12/2024 in [facility] [place] Office Visit from 09/11/2024 in [facility] Office Visit from 07/06/2024 in [facility]  Total GAD-7 Score 11 10 14 8 4    PHQ2-9    Flowsheet Row Counselor from 11/21/2024 in [facility] [place] Office Visit from 10/09/2024 in [facility] Office Visit from 10/03/2024 in [facility] [place] Counselor from 09/12/2024 in [facility] [place] Office Visit from 09/11/2024 in [facility]  PHQ-2 Total Score 5 2 0 4 1  PHQ-9 Total Score 16 9 -- 17 8   Flowsheet Row Video Visit from 12/10/2024 in [facility] [place] Counselor from 09/12/2024 in [facility] [place] Office Visit from 05/21/2024 in [facility] [place]  C-SSRS RISK CATEGORY No Risk Moderate Risk No Risk   Assessment and Plan:  Assessment: Summary: Today Allison Galloway reported worsening of mental health with decompensation of depression, anxiety, mood, sleep and tics.  She doesn't feel that current medications are effectively managing mental health or adjustments are needed.  She reported eating without difficulty but not sleeping well.  She denies suicidal/self-harm/homicidal ideation, psychosis, paranoia, and abnormal movements.  Medication assessment completed and adjustments made.  She denied suicidal/self-harm/homicidal ideation, psychosis, paranoia, abnormal movement.  Medication adjustments may During visit Allison Galloway was  dressed appropriately for age and current weather.  She was seated comfortably in view of camera with no noted distress.  She was alert, oriented x 4, calm, cooperative, and attentive.  Her mood was congruent with affect.  She had normal speech and behavior.  Objectively there was no evidence of psychosis, mania, or delusional thinking.  She  was able to converse coherently and responded appropriately with goal directed thoughts, no distractibility, or pre-occupation.  1. Conversion disorder with abnormal movement - guanFACINE  (TENEX ) 2 MG tablet; Take 2 tablets (4 mg total) by mouth at bedtime.  Dispense: 180 tablet; Refill: 1 - FLUoxetine  (PROZAC ) 40 MG capsule; Take 1 capsule (40 mg total) by mouth daily. Take with Prozac  10 mg to total 30 mg daily  Dispense: 90 capsule; Refill: 1  2. GAD (generalized anxiety disorder) - FLUoxetine  (PROZAC ) 40 MG capsule; Take 1 capsule (40 mg total) by mouth daily. Take with Prozac  10 mg to total 30 mg daily  Dispense: 90 capsule; Refill: 1 - busPIRone  (BUSPAR ) 10 MG tablet; Take 1 tablet (10 mg total) by mouth 3 (three) times daily.  Dispense: 270 tablet; Refill: 1 - hydrOXYzine  (ATARAX ) 10 MG tablet; Take 10-20 mg (1-2 tablets) twice daily as needed for anxiety and 10 mg-30 mg (1-3 tablets) as needed at bedtime as needed for sleep.  Dispense: 180 tablet; Refill: 3  3.  Severe episode of recurrent major depressive disorder, without psychotic features (HCC) - FLUoxetine  (PROZAC ) 40 MG capsule; Take 1 capsule (40 mg total) by mouth daily. Take with Prozac  10 mg to total 30 mg daily  Dispense: 90 capsule; Refill: 1  4. Insomnia, unspecified type - hydrOXYzine  (ATARAX ) 10 MG tablet; Take 10-20 mg (1-2 tablets) twice daily as needed for anxiety and 10 mg-30 mg (1-3 tablets) as needed at bedtime as needed for sleep.  Dispense: 180 tablet; Refill: 3  5. Thrombocytosis - mirtazapine  (REMERON ) 15 MG tablet; Take 1 tablet (15 mg total) by mouth at bedtime.  Dispense: 90  tablet; Refill: 1  6. History of tobacco abuse - mirtazapine  (REMERON ) 15 MG tablet; Take 1 tablet (15 mg total) by mouth at bedtime.  Dispense: 90 tablet; Refill: 1   Plan: Medications: Meds ordered this encounter  Medications   guanFACINE  (TENEX ) 2 MG tablet    Sig: Take 2 tablets (4 mg total) by mouth at bedtime.    Dispense:  180 tablet    Refill:  1    Supervising Provider:   ARFEEN, SYED T [[id number]]   FLUoxetine  (PROZAC ) 40 MG capsule    Sig: Take 1 capsule (40 mg total) by mouth daily. Take with Prozac  10 mg to total 30 mg daily    Dispense:  90 capsule    Refill:  1    Supervising Provider:   ARFEEN, SYED T [[id number]]   busPIRone  (BUSPAR ) 10 MG tablet    Sig: Take 1 tablet (10 mg total) by mouth 3 (three) times daily.    Dispense:  270 tablet    Refill:  1    Supervising Provider:   ARFEEN, SYED T [[id number]]   hydrOXYzine  (ATARAX ) 10 MG tablet    Sig: Take 10-20 mg (1-2 tablets) twice daily as needed for anxiety and 10 mg-30 mg (1-3 tablets) as needed at bedtime as needed for sleep.    Dispense:  180 tablet    Refill:  3    Supervising Provider:   ARFEEN, SYED T [[id number]]   mirtazapine  (REMERON ) 15 MG tablet    Sig: Take 1 tablet (15 mg total) by mouth at bedtime.    Dispense:  90 tablet    Refill:  1    Supervising Provider:   CURRY PATERSON T [[id number]]    Labs:  Not indicated at this time  Other:  Counseling/therapy: Continue services with Winton Rubinstein, LCSW. Allison Galloway is instructed to call [phone], mobile crisis, or present to the nearest emergency room should she experience any suicidal/homicidal ideation, auditory/visual/hallucinations, or detrimental worsening of her mental health condition.   Allison Galloway participated in the development of this treatment plan and verbalized her agreement with plan as listed.  Follow Up: Return in 2 months for medication management follow up Call in the interim for any side-effects, decompensation, questions, or problems  Collaboration  of Care: Collaboration of Care: Medication Management AEB medication assessment, adjustment, and refills  Patient/Guardian was advised Release of Information must be obtained prior to any record release in order to collaborate their care with an outside provider. Patient/Guardian was advised if they have not already done so to contact the registration department to sign all necessary forms in order for us  to release information regarding their care.   Consent: Patient/Guardian gives verbal consent for treatment and assignment of benefits for services provided during this visit. Patient/Guardian expressed understanding and agreed to proceed.    Dawud Mays, NP 12/10/2024,  7:29 PM

## 2024-12-12 ENCOUNTER — Encounter: Payer: Self-pay | Admitting: Oncology

## 2024-12-18 ENCOUNTER — Inpatient Hospital Stay: Admitting: Oncology

## 2024-12-18 ENCOUNTER — Inpatient Hospital Stay

## 2024-12-19 ENCOUNTER — Inpatient Hospital Stay: Payer: Medicare (Managed Care) | Admitting: Oncology

## 2024-12-19 ENCOUNTER — Inpatient Hospital Stay: Payer: Medicare (Managed Care) | Attending: Oncology

## 2024-12-19 ENCOUNTER — Ambulatory Visit (HOSPITAL_COMMUNITY): Payer: Medicare (Managed Care) | Admitting: Psychiatry

## 2024-12-19 VITALS — BP 101/78 | HR 50 | Temp 98.8°F | Resp 17 | Ht 62.0 in | Wt 162.8 lb

## 2024-12-19 DIAGNOSIS — F17210 Nicotine dependence, cigarettes, uncomplicated: Secondary | ICD-10-CM | POA: Insufficient documentation

## 2024-12-19 DIAGNOSIS — Z79899 Other long term (current) drug therapy: Secondary | ICD-10-CM | POA: Diagnosis not present

## 2024-12-19 DIAGNOSIS — F411 Generalized anxiety disorder: Secondary | ICD-10-CM | POA: Diagnosis not present

## 2024-12-19 DIAGNOSIS — D72829 Elevated white blood cell count, unspecified: Secondary | ICD-10-CM | POA: Insufficient documentation

## 2024-12-19 DIAGNOSIS — N920 Excessive and frequent menstruation with regular cycle: Secondary | ICD-10-CM | POA: Diagnosis present

## 2024-12-19 DIAGNOSIS — D75839 Thrombocytosis, unspecified: Secondary | ICD-10-CM

## 2024-12-19 DIAGNOSIS — D500 Iron deficiency anemia secondary to blood loss (chronic): Secondary | ICD-10-CM | POA: Diagnosis not present

## 2024-12-19 DIAGNOSIS — F332 Major depressive disorder, recurrent severe without psychotic features: Secondary | ICD-10-CM

## 2024-12-19 DIAGNOSIS — Z87891 Personal history of nicotine dependence: Secondary | ICD-10-CM

## 2024-12-19 LAB — CBC WITH DIFFERENTIAL/PLATELET
Abs Immature Granulocytes: 0.04 K/uL (ref 0.00–0.07)
Basophils Absolute: 0 K/uL (ref 0.0–0.1)
Basophils Relative: 0 %
Eosinophils Absolute: 0.4 K/uL (ref 0.0–0.5)
Eosinophils Relative: 3 %
HCT: 47.5 % — ABNORMAL HIGH (ref 36.0–46.0)
Hemoglobin: 15.9 g/dL — ABNORMAL HIGH (ref 12.0–15.0)
Immature Granulocytes: 0 %
Lymphocytes Relative: 28 %
Lymphs Abs: 3.5 K/uL (ref 0.7–4.0)
MCH: 30 pg (ref 26.0–34.0)
MCHC: 33.5 g/dL (ref 30.0–36.0)
MCV: 89.6 fL (ref 80.0–100.0)
Monocytes Absolute: 0.7 K/uL (ref 0.1–1.0)
Monocytes Relative: 6 %
Neutro Abs: 8 K/uL — ABNORMAL HIGH (ref 1.7–7.7)
Neutrophils Relative %: 63 %
Platelets: 270 K/uL (ref 150–400)
RBC: 5.3 MIL/uL — ABNORMAL HIGH (ref 3.87–5.11)
RDW: 12.9 % (ref 11.5–15.5)
WBC: 12.8 K/uL — ABNORMAL HIGH (ref 4.0–10.5)
nRBC: 0 % (ref 0.0–0.2)

## 2024-12-19 LAB — IRON AND TIBC
Iron: 87 ug/dL (ref 28–170)
Saturation Ratios: 29 % (ref 10.4–31.8)
TIBC: 295 ug/dL (ref 250–450)
UIBC: 209 ug/dL

## 2024-12-19 LAB — FERRITIN: Ferritin: 298 ng/mL (ref 11–307)

## 2024-12-19 NOTE — Assessment & Plan Note (Addendum)
 Improved hemoglobin from 9 to 14.4 and ferritin from 4.4 to 71 after 1g IV iron  administration.  She last received IV iron  on 10/12/2024. Labs from today show hemoglobin 15.9, iron  saturation 29% and ferritin 298.  -Tolerating oral iron  fair.  Has some constipation. - No additional IV iron  needed at this time. - Return to clinic in 3 months with labs a few days before. - Denies any bleeding, bright red blood per rectum, melena or hematochezia.

## 2024-12-19 NOTE — Assessment & Plan Note (Addendum)
Patient continues to smoke, albeit less frequently. Discussed the increased risk of cancers and systemic inflammation. -Strongly advised patient to quit smoking.
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
## 2024-12-19 NOTE — Assessment & Plan Note (Addendum)
Likely secondary to iron deficiency anemia -Resolved at this time

## 2024-12-19 NOTE — Assessment & Plan Note (Addendum)
 Patient has had elevated white count for the past couple of years ranging from 11 to as high as 14.1. The patient has a history of leukocytosis dating back to at least 04/07/2023 with neutrophil predominance.  We discussed potential etiologies of leukocytosis today including infection, inflammation, medications, asplenia, cigarette smoking, neoplasm.  No culprit medications noted upon review. She is not asplenia.  She has no signs of infection.  Hemoglobin most recently has been elevated but prior to that she was anemic in the setting of IDA.   Will obtain additional workup today including a repeat CBC, CMP, LDH, uric acid, technologist review of peripheral blood smear, flow cytometry, BCR-ABL.  Once we have this workup, we will make further recommendations.  I reviewed the potential causes of leukocytosis (specifically mild neutrophilia) including but not limited to: ?Any active inflammatory condition or infection ?Cigarette smoking, which may be the most common cause of mild neutrophilia ?Previously diagnosed hematologic disease (such as acute and chronic leukemias, chronic myeloproliferative or myelodysplastic disease) ?The presence of, and treatment for, a chronic anxiety state, panic disorder, rage, or emotional stress (eg, posttraumatic stress disorder, depression) ?Presence of non-hematologic diseases known to increase neutrophil counts (eg, eclampsia, thyroid  storm, hypercortisolism). ?Prior splenectomy or known asplenia ?Positive family history of neutrophilia ?Recent vaccination  ?Medications -- Various medications may cause neutrophilia. However,  such cases are rare and appear in the literature as isolated case reports.

## 2024-12-19 NOTE — Progress Notes (Signed)
 " [facility] HEMATOLOGY FOLLOW-UP VISIT  [name], Rock HERO, FNP  REASON FOR FOLLOW-UP: Iron  deficiency anemia  ASSESSMENT & PLAN:  Patient is a 44 year old female with iron  deficiency anemia referred for IV iron  replacement .   Assessment & Plan Iron  deficiency anemia due to chronic blood loss Improved hemoglobin from 9 to 14.4 and ferritin from 4.4 to 71 after 1g IV iron  administration.  She last received IV iron  on 10/12/2024. Labs from today show hemoglobin 15.9, iron  saturation 29% and ferritin 298.  -Tolerating oral iron  fair.  Has some constipation. - No additional IV iron  needed at this time. - Return to clinic in 3 months with labs a few days before. - Denies any bleeding, bright red blood per rectum, melena or hematochezia.  Thrombocytosis Likely secondary to iron  deficiency anemia -Resolved at this time History of tobacco abuse Patient continues to smoke, albeit less frequently. Discussed the increased risk of cancers and systemic inflammation. -Strongly advised patient to quit smoking. Leukocytosis, unspecified type Patient has had elevated white count for the past couple of years ranging from 11 to as high as 14.1. The patient has a history of leukocytosis dating back to at least 04/07/2023 with neutrophil predominance.  We discussed potential etiologies of leukocytosis today including infection, inflammation, medications, asplenia, cigarette smoking, neoplasm.  No culprit medications noted upon review. She is not asplenia.  She has no signs of infection.  Hemoglobin most recently has been elevated but prior to that she was anemic in the setting of IDA.   Will obtain additional workup today including a repeat CBC, CMP, LDH, uric acid, technologist review of peripheral blood smear, flow cytometry, BCR-ABL.  Once we have this workup, we will make further recommendations.  I reviewed the potential causes of leukocytosis (specifically mild  neutrophilia) including but not limited to: ?Any active inflammatory condition or infection ?Cigarette smoking, which may be the most common cause of mild neutrophilia ?Previously diagnosed hematologic disease (such as acute and chronic leukemias, chronic myeloproliferative or myelodysplastic disease) ?The presence of, and treatment for, a chronic anxiety state, panic disorder, rage, or emotional stress (eg, posttraumatic stress disorder, depression) ?Presence of non-hematologic diseases known to increase neutrophil counts (eg, eclampsia, thyroid  storm, hypercortisolism). ?Prior splenectomy or known asplenia ?Positive family history of neutrophilia ?Recent vaccination  ?Medications -- Various medications may cause neutrophilia. However,  such cases are rare and appear in the literature as isolated case reports.     Orders Placed This Encounter  Procedures   CBC with Differential/Platelet    Standing Status:   Future    Expected Date:   03/19/2025    Expiration Date:   06/17/2025   Iron  and TIBC    Standing Status:   Future    Expected Date:   03/19/2025    Expiration Date:   06/17/2025   Ferritin    Standing Status:   Future    Expected Date:   03/19/2025    Expiration Date:   06/17/2025   Lactate dehydrogenase    Standing Status:   Future    Expected Date:   03/19/2025    Expiration Date:   06/17/2025   Technologist smear review    Standing Status:   Future    Expected Date:   03/19/2025    Expiration Date:   06/17/2025    Clinical information::   elevcated wbc   Flow Cytometry, Peripheral Blood (Oncology)    Standing Status:   Future  Expected Date:   03/19/2025    Expiration Date:   06/17/2025   Comprehensive metabolic panel with GFR    Standing Status:   Future    Expected Date:   03/19/2025    Expiration Date:   06/17/2025   Uric acid    Standing Status:   Future    Expected Date:   03/19/2025    Expiration Date:   06/17/2025   BCR-ABL1 FISH    Standing Status:   Future     Expected Date:   03/19/2025    Expiration Date:   06/17/2025    The total time spent in the appointment was 20 minutes encounter with patients including review of chart and various tests results, discussions about plan of care and coordination of care plan   All questions were answered. The patient knows to call the clinic with any problems, questions or concerns. No barriers to learning was detected.  Delon FORBES Hope, NP 1/21/20263:25 PM    INTERVAL HISTORY: BAKER KOGLER 44 y.o. female with a history of iron  deficiency anemia secondary to menorrhagia, presents for follow-up after receiving IV iron  therapy.  Reports fatigue that is on and off.  2-3 nights per week she is unable to go out at night due to history of conversion disorder where she develops tics.  She is followed closely by psychiatry.  She has to has history of anxiety and depression.  She is here today with her daughter.  Reports an appetite of 75% energy levels are 65%.  Denies any pain.  She has occasional nausea, dizziness and headaches.  Anxiety and depression is under control but chronic.  Has sleep problems.  Reports overall improvement following her iron  infusion\on 10/12/2024.  Denies any melena, hematochezia or bright red blood per rectum.  She is currently not taking iron  supplements due to constipation.  She smokes cigarettes inconsistently.  Reports 1 week where she did not smoke at all.  Most of the time she smokes 1 pack/week.  She denies any recent steroid use.  The patient also has a history of IBS and has an upcoming appointment with a GI specialist. She denies any abdominal pain, blood in stools, or black colored stools.  I have reviewed the past medical history, past surgical history, social history and family history with the patient   ALLERGIES:  has no known allergies.  MEDICATIONS:  Current Outpatient Medications  Medication Sig Dispense Refill   RESTASIS 0.05 % ophthalmic emulsion 1 drop 2 (two)  times daily.     blood glucose meter kit and supplies Dispense based on patient and insurance preference. Use up to four times daily as directed. (FOR ICD-10 E10.9, E11.9). 1 each 0   busPIRone  (BUSPAR ) 10 MG tablet Take 1 tablet (10 mg total) by mouth 3 (three) times daily. 270 tablet 1   dapagliflozin  propanediol (FARXIGA ) 10 MG TABS tablet TAKE ONE TABLET (10 MG TOTAL) BY MOUTH DAILY AT 9AM BEFORE BREAKFAST 30 tablet 1   enalapril  (VASOTEC ) 2.5 MG tablet TAKE ONE TABLET (2.5MG  TOTAL) BY MOUTH DAILY AT 9AM 90 tablet 0   Enalapril -hydroCHLOROthiazide 5-12.5 MG tablet Take by mouth.     FLUoxetine  (PROZAC ) 40 MG capsule Take 1 capsule (40 mg total) by mouth daily. Take with Prozac  10 mg to total 30 mg daily 90 capsule 1   guanFACINE  (TENEX ) 2 MG tablet Take 2 tablets (4 mg total) by mouth at bedtime. 180 tablet 1   hydrOXYzine  (ATARAX ) 10 MG tablet Take 10-20  mg (1-2 tablets) twice daily as needed for anxiety and 10 mg-30 mg (1-3 tablets) as needed at bedtime as needed for sleep. 180 tablet 3   LINZESS  290 MCG CAPS capsule TAKE ONE CAPSULE (290 MCG TOTAL) BY MOUTH DAILY BEFORE BREAKFAST 90 capsule 11   mirtazapine  (REMERON ) 15 MG tablet Take 1 tablet (15 mg total) by mouth at bedtime. 90 tablet 1   ondansetron  (ZOFRAN ) 4 MG tablet Take 1 tablet (4 mg total) by mouth every 8 (eight) hours as needed for nausea or vomiting. 20 tablet 0   polyethylene glycol powder (GLYCOLAX /MIRALAX ) 17 GM/SCOOP powder Take 17 g by mouth daily. 3350 g 1   VENTOLIN  HFA 108 (90 Base) MCG/ACT inhaler Inhale 2 puffs into the lungs every 4 (four) hours as needed.     Current Facility-Administered Medications  Medication Dose Route Frequency Provider Last Rate Last Admin   cyanocobalamin  (VITAMIN B12) injection 1,000 mcg  1,000 mcg Intramuscular Q30 days Severa Rock HERO, FNP         REVIEW OF SYSTEMS:   Review of Systems  Constitutional:  Positive for malaise/fatigue.  Gastrointestinal:  Positive for nausea.   Neurological:  Positive for dizziness and headaches.  Psychiatric/Behavioral:  Positive for depression. The patient is nervous/anxious and has insomnia.      PHYSICAL EXAMINATION:   Vitals:   12/19/24 1021  BP: 101/78  Pulse: (!) 50  Resp: 17  Temp: 98.8 F (37.1 C)  SpO2: 100%     Physical Exam Constitutional:      Appearance: Normal appearance.  Cardiovascular:     Rate and Rhythm: Normal rate and regular rhythm.  Pulmonary:     Effort: Pulmonary effort is normal.     Breath sounds: Normal breath sounds.  Abdominal:     General: Bowel sounds are normal.     Palpations: Abdomen is soft.  Musculoskeletal:        General: No swelling. Normal range of motion.  Neurological:     Mental Status: She is alert and oriented to person, place, and time. Mental status is at baseline.     LABORATORY DATA:  I have reviewed the data as listed  Lab Results  Component Value Date   WBC 12.8 (H) 12/19/2024   NEUTROABS 8.0 (H) 12/19/2024   HGB 15.9 (H) 12/19/2024   HCT 47.5 (H) 12/19/2024   MCV 89.6 12/19/2024   PLT 270 12/19/2024      Component Value Date/Time   NA 139 09/11/2024 1109   K 5.1 09/11/2024 1109   CL 103 09/11/2024 1109   CO2 17 (L) 09/11/2024 1109   GLUCOSE 95 09/11/2024 1109   GLUCOSE 139 (H) 03/29/2024 1121   BUN 10 09/11/2024 1109   CREATININE 1.00 09/11/2024 1109   CALCIUM 10.1 09/11/2024 1109   PROT 7.6 09/11/2024 1109   ALBUMIN 4.6 09/11/2024 1109   AST 28 09/11/2024 1109   ALT 28 09/11/2024 1109   ALKPHOS 79 09/11/2024 1109   BILITOT 0.4 09/11/2024 1109   GFRNONAA >60 07/01/2023 0847   GFRAA >60 07/11/2020 1926       Chemistry      Component Value Date/Time   NA 139 09/11/2024 1109   K 5.1 09/11/2024 1109   CL 103 09/11/2024 1109   CO2 17 (L) 09/11/2024 1109   BUN 10 09/11/2024 1109   CREATININE 1.00 09/11/2024 1109      Component Value Date/Time   CALCIUM 10.1 09/11/2024 1109   ALKPHOS 79 09/11/2024 1109  AST 28 09/11/2024 1109    ALT 28 09/11/2024 1109   BILITOT 0.4 09/11/2024 1109     Lab Results  Component Value Date   IRON  87 12/19/2024   TIBC 295 12/19/2024   FERRITIN 298 12/19/2024    "

## 2024-12-21 NOTE — Addendum Note (Signed)
 Addended by: Graycen Sadlon E on: 12/21/2024 03:28 PM   Modules accepted: Orders

## 2024-12-21 NOTE — Telephone Encounter (Signed)
 Patient said she will come when its clear to pick up the kit. She said she wont forget. I will follow up on this. New kit placed up front on second floor

## 2024-12-25 ENCOUNTER — Other Ambulatory Visit: Payer: Self-pay | Admitting: Family Medicine

## 2024-12-25 DIAGNOSIS — E119 Type 2 diabetes mellitus without complications: Secondary | ICD-10-CM

## 2025-01-02 ENCOUNTER — Ambulatory Visit (HOSPITAL_COMMUNITY): Admitting: Psychiatry

## 2025-01-02 ENCOUNTER — Encounter: Payer: Self-pay | Admitting: Oncology

## 2025-01-02 DIAGNOSIS — F411 Generalized anxiety disorder: Secondary | ICD-10-CM
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
## 2025-01-04 ENCOUNTER — Encounter: Payer: Self-pay | Admitting: Family Medicine

## 2025-01-09 ENCOUNTER — Ambulatory Visit: Admitting: Family Medicine

## 2025-01-16 ENCOUNTER — Ambulatory Visit (HOSPITAL_COMMUNITY): Admitting: Psychiatry

## 2025-02-05 ENCOUNTER — Ambulatory Visit (HOSPITAL_COMMUNITY): Payer: Medicare (Managed Care) | Admitting: Psychiatry

## 2025-02-11 ENCOUNTER — Ambulatory Visit (HOSPITAL_COMMUNITY): Payer: Medicare (Managed Care) | Admitting: Registered Nurse

## 2025-02-20 ENCOUNTER — Ambulatory Visit (HOSPITAL_COMMUNITY): Payer: Medicare (Managed Care) | Admitting: Psychiatry

## 2025-03-20 ENCOUNTER — Inpatient Hospital Stay: Payer: Medicare (Managed Care)

## 2025-03-27 ENCOUNTER — Inpatient Hospital Stay: Payer: Medicare (Managed Care) | Admitting: Oncology
